# Patient Record
Sex: Female | Born: 1970 | Race: White | Hispanic: No | Marital: Married | Smoking: Never smoker
Health system: Southern US, Community
[De-identification: ages and names within clinical notes are randomized; demographics above are authoritative.]

## PROBLEM LIST (undated history)

## (undated) DIAGNOSIS — D649 Anemia, unspecified: Secondary | ICD-10-CM

## (undated) DIAGNOSIS — IMO0001 Reserved for inherently not codable concepts without codable children: Secondary | ICD-10-CM

## (undated) DIAGNOSIS — M1810 Unilateral primary osteoarthritis of first carpometacarpal joint, unspecified hand: Secondary | ICD-10-CM

## (undated) DIAGNOSIS — N289 Disorder of kidney and ureter, unspecified: Secondary | ICD-10-CM

## (undated) DIAGNOSIS — N979 Female infertility, unspecified: Secondary | ICD-10-CM

## (undated) DIAGNOSIS — R7303 Prediabetes: Secondary | ICD-10-CM

## (undated) DIAGNOSIS — R200 Anesthesia of skin: Secondary | ICD-10-CM

## (undated) DIAGNOSIS — N1330 Unspecified hydronephrosis: Secondary | ICD-10-CM

## (undated) DIAGNOSIS — M2550 Pain in unspecified joint: Secondary | ICD-10-CM

## (undated) DIAGNOSIS — K219 Gastro-esophageal reflux disease without esophagitis: Principal | ICD-10-CM

## (undated) DIAGNOSIS — L709 Acne, unspecified: Secondary | ICD-10-CM

## (undated) DIAGNOSIS — R600 Localized edema: Secondary | ICD-10-CM

## (undated) DIAGNOSIS — M25569 Pain in unspecified knee: Secondary | ICD-10-CM

## (undated) HISTORY — DX: Unspecified hydronephrosis: N13.30

## (undated) HISTORY — DX: Localized edema: R60.0

## (undated) HISTORY — DX: Female infertility, unspecified: N97.9

## (undated) HISTORY — DX: Unilateral primary osteoarthritis of first carpometacarpal joint, unspecified hand: M18.10

## (undated) HISTORY — PX: OTHER SURGICAL HISTORY: SHX169

## (undated) HISTORY — DX: Prediabetes: R73.03

## (undated) HISTORY — DX: Acne, unspecified: L70.9

## (undated) HISTORY — DX: Disorder of kidney and ureter, unspecified: N28.9

## (undated) HISTORY — DX: Pain in unspecified knee: M25.569

## (undated) HISTORY — DX: Anemia, unspecified: D64.9

## (undated) HISTORY — DX: Reserved for inherently not codable concepts without codable children: IMO0001

## (undated) HISTORY — DX: Pain in unspecified joint: M25.50

## (undated) HISTORY — DX: Anesthesia of skin: R20.0

## (undated) HISTORY — DX: Gastro-esophageal reflux disease without esophagitis: K21.9

---

## 1999-05-19 ENCOUNTER — Other Ambulatory Visit: Admission: RE | Admit: 1999-05-19 | Discharge: 1999-05-19 | Payer: Self-pay | Admitting: Obstetrics and Gynecology

## 1999-12-11 ENCOUNTER — Other Ambulatory Visit: Admission: RE | Admit: 1999-12-11 | Discharge: 1999-12-11 | Payer: Self-pay | Admitting: Obstetrics and Gynecology

## 2001-03-14 ENCOUNTER — Ambulatory Visit (HOSPITAL_COMMUNITY): Admission: RE | Admit: 2001-03-14 | Discharge: 2001-03-14 | Payer: Self-pay | Admitting: Obstetrics and Gynecology

## 2001-03-14 ENCOUNTER — Encounter: Payer: Self-pay | Admitting: Obstetrics and Gynecology

## 2001-05-02 ENCOUNTER — Encounter (INDEPENDENT_AMBULATORY_CARE_PROVIDER_SITE_OTHER): Payer: Self-pay

## 2001-05-02 ENCOUNTER — Other Ambulatory Visit: Admission: RE | Admit: 2001-05-02 | Discharge: 2001-05-02 | Payer: Self-pay | Admitting: Obstetrics and Gynecology

## 2001-07-17 ENCOUNTER — Other Ambulatory Visit: Admission: RE | Admit: 2001-07-17 | Discharge: 2001-07-17 | Payer: Self-pay | Admitting: Obstetrics and Gynecology

## 2002-02-03 ENCOUNTER — Encounter: Payer: Self-pay | Admitting: Internal Medicine

## 2002-02-03 ENCOUNTER — Encounter: Admission: RE | Admit: 2002-02-03 | Discharge: 2002-02-03 | Payer: Self-pay | Admitting: Internal Medicine

## 2002-02-17 ENCOUNTER — Ambulatory Visit (HOSPITAL_COMMUNITY): Admission: RE | Admit: 2002-02-17 | Discharge: 2002-02-17 | Payer: Self-pay | Admitting: Orthopedic Surgery

## 2002-02-17 ENCOUNTER — Encounter: Payer: Self-pay | Admitting: Orthopedic Surgery

## 2002-06-04 ENCOUNTER — Ambulatory Visit (HOSPITAL_COMMUNITY): Admission: RE | Admit: 2002-06-04 | Discharge: 2002-06-04 | Payer: Self-pay | Admitting: Internal Medicine

## 2002-06-04 ENCOUNTER — Encounter: Payer: Self-pay | Admitting: Internal Medicine

## 2002-08-21 ENCOUNTER — Other Ambulatory Visit: Admission: RE | Admit: 2002-08-21 | Discharge: 2002-08-21 | Payer: Self-pay | Admitting: Obstetrics and Gynecology

## 2003-07-30 ENCOUNTER — Other Ambulatory Visit: Admission: RE | Admit: 2003-07-30 | Discharge: 2003-07-30 | Payer: Self-pay | Admitting: Obstetrics and Gynecology

## 2004-03-21 ENCOUNTER — Ambulatory Visit (HOSPITAL_COMMUNITY): Admission: RE | Admit: 2004-03-21 | Discharge: 2004-03-21 | Payer: Self-pay | Admitting: Obstetrics and Gynecology

## 2004-03-24 ENCOUNTER — Ambulatory Visit (HOSPITAL_COMMUNITY): Admission: RE | Admit: 2004-03-24 | Discharge: 2004-03-24 | Payer: Self-pay | Admitting: Urology

## 2004-04-26 ENCOUNTER — Inpatient Hospital Stay (HOSPITAL_COMMUNITY): Admission: AD | Admit: 2004-04-26 | Discharge: 2004-04-26 | Payer: Self-pay | Admitting: Obstetrics and Gynecology

## 2004-05-29 ENCOUNTER — Inpatient Hospital Stay (HOSPITAL_COMMUNITY): Admission: AD | Admit: 2004-05-29 | Discharge: 2004-06-01 | Payer: Self-pay | Admitting: Obstetrics and Gynecology

## 2004-06-02 ENCOUNTER — Inpatient Hospital Stay (HOSPITAL_COMMUNITY): Admission: AD | Admit: 2004-06-02 | Discharge: 2004-06-02 | Payer: Self-pay | Admitting: Obstetrics and Gynecology

## 2004-06-04 ENCOUNTER — Inpatient Hospital Stay (HOSPITAL_COMMUNITY): Admission: AD | Admit: 2004-06-04 | Discharge: 2004-06-08 | Payer: Self-pay | Admitting: Obstetrics and Gynecology

## 2004-06-05 ENCOUNTER — Encounter (INDEPENDENT_AMBULATORY_CARE_PROVIDER_SITE_OTHER): Payer: Self-pay | Admitting: Specialist

## 2004-06-23 ENCOUNTER — Ambulatory Visit (HOSPITAL_BASED_OUTPATIENT_CLINIC_OR_DEPARTMENT_OTHER): Admission: RE | Admit: 2004-06-23 | Discharge: 2004-06-23 | Payer: Self-pay | Admitting: Urology

## 2004-06-23 ENCOUNTER — Ambulatory Visit (HOSPITAL_COMMUNITY): Admission: RE | Admit: 2004-06-23 | Discharge: 2004-06-23 | Payer: Self-pay | Admitting: Urology

## 2004-07-20 ENCOUNTER — Other Ambulatory Visit: Admission: RE | Admit: 2004-07-20 | Discharge: 2004-07-20 | Payer: Self-pay | Admitting: Obstetrics and Gynecology

## 2005-08-07 ENCOUNTER — Other Ambulatory Visit: Admission: RE | Admit: 2005-08-07 | Discharge: 2005-08-07 | Payer: Self-pay | Admitting: Obstetrics and Gynecology

## 2005-09-05 ENCOUNTER — Encounter: Admission: RE | Admit: 2005-09-05 | Discharge: 2005-09-05 | Payer: Self-pay | Admitting: Obstetrics and Gynecology

## 2006-08-09 ENCOUNTER — Other Ambulatory Visit: Admission: RE | Admit: 2006-08-09 | Discharge: 2006-08-09 | Payer: Self-pay | Admitting: Obstetrics and Gynecology

## 2010-12-12 ENCOUNTER — Encounter
Admission: RE | Admit: 2010-12-12 | Discharge: 2010-12-12 | Payer: Self-pay | Source: Home / Self Care | Attending: Obstetrics and Gynecology | Admitting: Obstetrics and Gynecology

## 2011-04-20 NOTE — Discharge Summary (Signed)
NAME:  Kendra Day, Kendra Day                     ACCOUNT NO.:  [id number]   MEDICAL RECORD NO.:  [id number]                   PATIENT TYPE:  INP   LOCATION:  9310                                 FACILITY:  [facility]   PHYSICIAN:  Huel Cote, M.D.              DATE OF BIRTH:  [birth year]   DATE OF ADMISSION:  06/04/2004  DATE OF DISCHARGE:  06/08/2004                                 DISCHARGE SUMMARY   DISCHARGE DIAGNOSES:  1. Preterm pregnancy at 34+ weeks delivered.  2. Acute renal failure with history of obstructive nephropathy.  3. Possible superimposed preeclampsia.  4. Desired permanent sterility.  5. Status post low transverse cesarean section with bilateral tubal     ligation.   DISCHARGE MEDICATIONS:  1. Percocet 1-2 tablets p.o. q.4h. p.r.n. pain.  2. Labetalol 200 mg p.o. b.i.d.  3. Cipro 500 mg p.o. daily.   FOLLOWUP:  The patient is to follow up with Dr. Ihor Gully of Urology in  approximately 3-4 days for follow up of her creatinine and hydronephrosis  status.  She will follow up with our office in approximately 2 weeks for  incision check and blood pressure check.   HOSPITAL COURSE:  The patient is a 40 year old G1, P0 who is admitted at 34+  weeks with a complaint of increased leg and back pain as well as edema.  She  had no headache, nausea and vomiting was baseline for her over the last  several weeks, and no abdominal pain.  The patient had a very complicated  prenatal course with history of obstructive nephropathy which was first  noted at [redacted] weeks gestation and initially responded to bilateral stent  placement by urology.  After approximately 3-4 weeks, the patient began to  bump her creatinine and have worsening hydronephrosis and it was felt this  was due to reflux into the stent, so she was decompressed with the Foley  catheter which did improve her creatinine and hydronephrosis temporarily.  The patient refused to continue decompression with Foley  catheter  indefinitely, and therefore was followed with serial ultrasounds and  creatinines which remained stable at approximately 1.6-1.8 for most of the  remaining pregnancy.  The week prior to admission, the patient notably had a  jump in her creatinine from 2-2.4, was admitted, completed a 24 hour urine  and other observation lab studies which did not reveal any other evidence of  preeclampsia. Creatinine stabilized with a Foley catheter replaced, and the  patient agreed to continue this, was discharged home.  Creatinine upon  discharge was 2.2.  She then was home for approximately four days when she  noted increased swelling and continued nausea and vomiting. On return to the  hospital, the patient's creatinine was found to have risen to 3.4.  She also  had placenta previa noted on early ultrasound which was felt to be resolved  at the 32 week ultrasound and there was fetal renal pyelectasis noted  on  ultrasound which remained stable.  Last ultrasound on June 16.   PAST GYNECOLOGICAL HISTORY:  Significant for HPV and infertility.   PAST MEDICAL HISTORY:  Frequent urinary tract infections  __________.   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   ALLERGIES:  PENICILLIN, SULFA, KEFLEX.   ADMISSION MEDICATIONS:  1. Phenergan.  2. Zofran.  3. Zantac.  4. Ditropan.   PHYSICAL EXAMINATION:  VITAL SIGNS:  Afebrile, stable vital signs.  Blood  pressure 150-170/90-100.  Fetal heart rate was reactive with some  irritability noted.  HEART:  Regular rate and rhythm.  LUNGS:  Clear.  ABDOMEN:  Soft, gravid, nontender.  She had 3+ pitting edema.  DTR's were  3/4 with 1-2 beats of clonus.  Her cervix was closed, 30 and high, and she  was vertex by ultrasound.   HOSPITAL COURSE:  The patient was admitted for worsening edema and blood  pressures which were persistently elevated with the intention of probable  delivery should her  condition warrant it.  The patient was begun on  magnesium prophylaxis for possible superimposed preeclampsia.  However,  received no bolus and simply was begun on 1 g an hour.  Repeat lab several  hours after admission revealed her creatinine rising even more to 3.6.  At  this point, urology and nephrology were consulted and all were in agreement  that the patient should be delivered given the unknown etiology of her  worsening renal disease and possible superimposed preeclampsia.  It was felt  that any morbidity of any further procedures for obstruction was not worth  the possible benefit to the baby which had already received steroids and was  greater than 34 weeks in gestation.  As the patient was remote from delivery  and would have required a lengthy induction, decision was made to proceed  with primary cesarean section and spare any further worsening of her renal  function.  She underwent a low transverse cesarean section and a bilateral  tubal ligation per her request on June 05, 2004, and was delivered of a  viable female infant which was in transverse  presentation.  Apgars were 4 and  8.  Weight was 5 pounds 9 ounces. Cor pH was 7.26.  Placenta was low and  anterior and did require going through with the incision.  The ovaries were  normal bilaterally.  She had a right fallopian tube which had a large  varicosity encasing the entire tube.  The left tube was normal.  Estimated  blood loss for the cesarean section was 1000 cc.  She did well  postoperatively and was continued on her magnesium for 24 hours.  This was  discontinued the following day.  Urine output began to increase  substantially with diuresis noted postoperative day #1.  Her blood pressures  remained elevated at 160-170/90.  Creatinine began to slowly improve and  decrease to 3.1.  She was followed closely over the next several days, and  on postop day #3, the patient's creatinine was down to 2.3.  Hemoglobin was stable  at 8.2.  Blood pressure had become better at 130-70/80 on Labetalol  100 mg p.o. twice daily.  She was tolerating regular diet.  Had no further  nausea and vomiting and felt overall well.  She was therefore felt stable  for discharge home.  Her Foley was discontinued the day of discharge per  request after discussions with Dr. Ihor Gully, and it  was agreed that she  will be followed up within several days with repeat creatinine and  ultrasound to ensure further improvement.                                               Huel Cote, M.D.    KR/MEDQ  D:  06/08/2004  T:  06/08/2004  Job:  [id number]

## 2011-04-20 NOTE — Discharge Summary (Signed)
NAME:  Kendra Day, Kendra Day                     ACCOUNT NO.:  [id number]   MEDICAL RECORD NO.:  [id number]                   PATIENT TYPE:  INP   LOCATION:  9159                                 FACILITY:  [facility]   PHYSICIAN:  Huel Cote, M.D.              DATE OF BIRTH:  [birth year]   DATE OF ADMISSION:  05/29/2004  DATE OF DISCHARGE:  06/01/2004                                 DISCHARGE SUMMARY   DISCHARGE DIAGNOSES:  1. Preterm pregnancy at 33+ weeks, undelivered.  2. Obstructive uropathy, rule out preeclampsia.  3. Status post betamethasone x2.   DISCHARGE FOLLOW-UP:  The patient will be returning for nonstress tests and  labs in approximately 2 days at the maternity admissions unit.   DISCHARGE MEDICATIONS:  1. Zofran 8 mg p.o. b.i.d. p.r.n.  2. Phenergan 25 mg p.o. q.4-6h. p.r.n.   HOSPITAL COURSE:  The patient is a 40 year old G1 P0 who was admitted at 4  and four-sevenths weeks gestation for evaluation given an ongoing problem  with renal disease, hypertension, and worsening creatinine function on  laboratory.  The patient had been followed by Dr. Vernie Ammons of urology since  approximately [redacted] weeks gestation for obstructive uropathy which was believed  to be secondary to history of ureteral implantation surgery as a child and  the gravid uterus kinking the system.  The patient had stents placed at 24  weeks with improvement initially in her creatinine and hydronephrosis.  However, at 27-28 weeks the recurrent hydronephrosis was present with  increased creatinine and was felt to be secondary to reflux in the stents.  When the patient had a Foley catheter replaced to continuous drainage her  creatinine did improve.  However, she was too uncomfortable to maintain this  catheter in place.  Therefore, she was followed with serial ultrasounds and  creatinines which were stable at approximately 1.6.  Over the last week the  patient developed nausea, vomiting, and increased back  pain with creatinine  increasing to 2.2 and proteinuria present.  A 24-hour urine revealed 400 mg  of proteinuria and blood pressure was elevated at 150 to 160 over 100 to 105  which was somewhat consistent with her pressures throughout the entire  pregnancy.  Prenatal care otherwise had been complicated only by placenta  previa which resolved, and fetal pyelectasis which was stable by last  ultrasound on May 17, 2004.  Prenatal labs were as follows:  A positive,  antibody negative, rubella immune, RPR nonreactive, HIV declined, GC  negative, chlamydia negative.  Past OB history:  The patient has one adopted  child from [place] but no prior pregnancies.  Past GYN history:  HPV.  Past  surgical history:  In 1973 she had the ureteral reimplantation and stent  placement in 2005.  Her allergies include PENICILLIN and SULFA.  Medications  are prophylactic antibiotics with Macrobid, Zofran, Zantac, and K-Dur.  On  admission her blood pressure was consistent with 140s over  80s and NST was  reactive.  Cardiac exam was regular rate and rhythm.  Lungs were clear.  She  had edema present, approximately 1+.  PIH labs were performed and within  normal limits on the liver studies and platelets.  Her uric acid was  elevated at 8.6 and her creatinine was 2.2.  A 24-hour urine had a good  volume at 2700 mL and total protein of 410 mg.  The patient was admitted and  placed a Foley catheter to see if her creatinines would once again improve  with decompression.  This was left in place for approximately 2 days and the  patient received betamethasone x2 doses, given the possible need to deliver.  The patient was continued in-house for 4 days into [redacted] weeks gestation.  At  that point her blood pressures were stable at 150 to 160 over 90 to 100.  PIH labs remained stable and her creatinine improved to 2.2 with Foley  decompression.  Uric acid decreased to 6.9.  There was no other evidence of  preeclampsia and  urine protein was  negative on catheterized specimen.  It was therefore felt that the patient's  symptoms had been related primarily to her obstructive nephropathy and her  Foley catheter was left in place for continued observation.  She will return  in 2 days time for a repeat NST and creatinine evaluation to ensure no  worsening of this function.                                               Huel Cote, M.D.    KR/MEDQ  D:  07/20/2004  T:  07/21/2004  Job:  [id number]

## 2011-04-20 NOTE — Consult Note (Signed)
NAME:  Kendra Day, Kendra Day                     ACCOUNT NO.:  [id number]   MEDICAL RECORD NO.:  [id number]                   PATIENT TYPE:  INP   LOCATION:  9372                                 FACILITY:  [facility]   PHYSICIAN:  Cynthia B. Eliott Nine, M.D.             DATE OF BIRTH:  21-Feb-1971   DATE OF CONSULTATION:  DATE OF DISCHARGE:                                   CONSULTATION   REASON FOR CONSULTATION:  Worsening renal failure.   HISTORY:  This is a 40 year old white female who is now about 34-1/[redacted] weeks  pregnant who is admitted with back and flank discomfort, edema, worsening  hypertension and worsening renal insufficiency.  Her history is pertinent  for childhood reflux requiring bilateral ureteral reimplantation in the  1970s.  She has had problems over the years with recurrent urinary tract  infections.  Her serum creatinine in 2004 was at a baseline of 1.1.   She was evaluated by Dr. Ihor Gully because of a serum creatinine of 1.8  to 1.9.  This was back in April of 2005.  She had an ultrasound which  confirmed bilateral hydronephrosis as well as a perinephric fluid collection  on the right.  She had a subsequent ultrasound that showed worsening  hydronephrosis with a new perinephric fluid collection on the contralateral  side.  She was taken to the operating room on April 22nd and underwent  placement of bilateral ureteral stents.  Both ureteral orifices at that time  were identified and appeared to be widely patent.  The double J stents were  placed and with good position.   Foley catheter drainage of the bladder was also recommended, but she could  not tolerate chronic indwelling placement of the Foley because of  discomfort.   She was just readmitted last week with a further elevation in her creatinine  which improved some with Foley drainage.  She was just readmitted with  worsening flank discomfort which she describes as being just like my kidney  when it hurts and an  elevated blood pressure which has ranged in the 160/90  to 100 range (but was also elevated during her pregnancy) and edema.  She is  to undergo a cesarean section later this afternoon in hopes of resolving her  obstruction and improving her hypertension.  There is some thought that she  may have some preeclampsia, although that is not a strong suspicion in the  minds of the obstetrics group who feel that this is more likely a more  chronic hypertension.   PAST MEDICAL HISTORY:  1. Significant for childhood reflux requiring bilateral ureteral stent     implantation.  2. Recurrent cystitis/urinary tract infections.   ALLERGIES:  ALLERGIES TO PENICILLIN AND SULFA.   FAMILY HISTORY:  Positive for hypertension, diabetes, kidney stones, and  prostate cancer.  There is no other family member with reflux nephropathy.   SOCIAL HISTORY:  The patient has not used any  alcohol during her pregnancy.  She does not use tobacco.  She is married.  She and her husband have an  adopted 65-year-old son from [place].   REVIEW OF SYSTEMS:  Positive for nausea, back pain, breast soreness and  swelling.   PHYSICAL EXAMINATION:  GENERAL:  She is a very pleasant young woman who is  in no distress.  She is hooked up to various monitors, a Foley catheter is  in place which is draining somewhat dark concentrated looking urine.  VITAL SIGNS: Blood pressure 168/98, heart rate 88 and regular.  NECK:  She had no discernable JVD.  LUNGS:  Entirely clear.  CARDIAC:  S1, S2, no audible S3, S4 or murmur.  ABDOMEN:  Protuberant and gravid.  There was no focal abdominal tenderness.  EXTREMITIES:  She had 1 to 2+ edema of the lower extremities.   LABORATORY DATA:  Sodium 137, potassium 3.1, chloride 105, CO2 22, BUN 14,  creatinine 3.6, calcium 7.9, magnesium 3.6, hemoglobin 9.1, hematocrit 26.9,  WBC 11,400.  Urinalysis showed 30 mg percent protein with 7 10 to white  cells.  Uric acid was 8.6, total LDH 154, alkaline  phosphatase elevated at  236, SGOT and SGPT and bilirubin were all within normal limits.   IMPRESSION:  A 40 year old white female with a history of reflux and prior  history of bilateral ureteral reimplantation with bilateral double J stents  in place for hydronephrosis who presents with one doubling of her serum  creatinine from her baseline just over the past several days.  I would have  to presume that this is largely related to obstruction.  With the  anticipated cesarean section later on today, hopefully this problem with  resolve itself and not require reevaluation by urology and/or replacement of  stents.  However, if with the delivery her renal function does not improve,  this will need to be pursued.   She is hypertensive and has had blood pressures according to her  obstetrician outpatient clinic records as high as 170/100.  I agree with the  use of intravenous labetalol which has already been implemented.  She may  likely have chronic hypertension and this will need to be addressed  postpartum.  I, too, doubt seriously that she has an element of  preeclampsia, but again the delivery should take care of this issue as well.  It is important to be careful with the use of magnesium in this patient  until we see improvement in her renal function.  Standard doses could result  in an increase in levels fairly quickly.   RECOMMENDATIONS:  1. Agree with cesarean section.  2. Would monitor renal function studies every six hours for the first 24     hours to look for trends, hopefully downward, in her serum creatinine.  3. Would not use an perioperative nonsteroidal anti-inflammatory drugs for     pain.  4. She has been receiving some Macrodantin as an outpatient.  This is not     highly effective and potentially toxic with a creatinine in the 3s, I     would opt for something like Cipro since she has allergies to penicillin     and sulfa. 5. She will require outpatient followup for  her reflux and chronic kidney     disease.  Even if her creatinine returns to a baseline of 1.1 this is     clearly not normal renal function for a 40 year old female and she would  require nephrology followup if possible.  6. Urology should become reinvolved particularly if her creatinine does not     improve postdelivery.   We appreciate the opportunity to participate in the care of this very nice  young lady.  We will follow closely with you.                                               Duke Salvia Eliott Nine, M.D.    CBD/MEDQ  D:  06/05/2004  T:  06/05/2004  Job:  [id number]

## 2011-04-20 NOTE — H&P (Signed)
NAME:  Kendra Day, Kendra Day                     ACCOUNT NO.:  [id number]   MEDICAL RECORD NO.:  [id number]                   PATIENT TYPE:  AMB   LOCATION:  DAY                                  FACILITY:  [facility]   PHYSICIAN:  Mark C. Vernie Ammons, M.D.               DATE OF BIRTH:  1971-10-25   DATE OF ADMISSION:  03/23/2004  DATE OF DISCHARGE:                                HISTORY & PHYSICAL   The patient is a 40 year old white female who was seen in office  consultation today for further evaluation of bilateral hydronephrosis.  I  had spoken with Dr. Senaida Ores on the phone about the patient, who is [redacted]  weeks pregnant, and was found to have an elevation of her creatinine, and a  low creatinine clearance.  That prompted a renal ultrasound, which revealed  bilateral hydronephrosis.  The patient states that she has had pain in her  right flank region which comes and goes.  It has been there about a month.  It is achy in type.  It radiates slightly into the abdomen.  She noted also  that when she had her ultrasound done that when the probe was being placed  in the area of the kidney, it was tender.  She has not seen any hematuria.  She does have increased frequency and urgency, as to be expected with her  pregnancy.  She otherwise has not had any flank pain, nausea, or vomiting,  fever, or chills.  She otherwise has not had any difficulty, other than some  recent hypertension.   PAST MEDICAL HISTORY:  Negative for any major medical problems.   SURGICAL HISTORY:  She had bilateral ureteral re-implant by her history for  reflux at the age of 76.   MEDICATIONS:  1. Zantac.  2. Tums.   ALLERGIES:  1. PENICILLIN.  2. SULFA.  (But she can take Keflex, and has done so in the past).   SOCIAL HISTORY:  No tobacco or ethanol.   FAMILY HISTORY:  Father is 2.  Mother is 9.  There is hypertension,  prostate cancer, diabetes, and kidney stones in the family.   REVIEW OF SYSTEMS:  As noted per  patient check list in the chart, and  includes frequency, swelling in her lower extremities.   LABORATORY RESULTS:  On March 16, 2004, her creatinine was 1.9.  A repeat on  March 20, 2004 was 1.8.  Old creatinines were obtained, and in June of 2003  it was 0.9, and in February of 2004, it was 1.1.   PHYSICAL EXAMINATION:  GENERAL:  The patient is a well-developed, well-  nourished, white female in no apparent distress.  VITAL SIGNS:  Blood pressure is 165/101, pulse 97, temperature 98.6.  HEENT:  Atraumatic and normocephalic.  Oropharynx is clear.  NECK:  Supple with midline trachea.  LUNGS:  She has normal respiratory effort.  CARDIOVASCULAR:  Regular rate and rhythm.  ABDOMEN:  Reveals gravid uterus.  Nontender, no mass.  She had mild right  CVA tenderness to percussion.  PELVIC:  Deferred until her surgery tomorrow.  EXTREMITIES:  Without clubbing, cyanosis, or mild edema in the lower  extremities.  No deformity.  SKIN:  Warm and dry.  NEUROLOGIC:  She is alert and oriented with appropriate mood and affect, and  no gross focal neurologic deficits.   I reviewed her renal ultrasound done on March 21, 2004 which revealed  moderate to severe bilateral hydronephrosis.  There is some clubbing of the  caliceal systems.  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   Her urinalysis today is clear.   I repeated a renal ultrasound using a 5 MHz probe, I noted her right kidney  measured 14 cm with 14 mm of parenchyma.  The left kidney measured 12.6 cm  with 8 mm of parenchyma.  Both kidneys had bilateral hydronephrosis.  It was  severe on both sides, and appears to have progressed.  The perinephric fluid  collection is again noted.  It is echogenic and unchanged in size.  There  also appeared to be a small amount of perinephric fluid near  the upper pole  on the contralateral side, as well.   IMPRESSION:  Bilateral hydronephrosis with interval worsening by ultrasound,  and an elevated creatinine from baseline.  I discussed with the patient and  her husband the fact that I thought by ultrasound some of the changes were  chronic, but it appears that since there has been progression, that there is  undoubtedly bilateral obstruction causing this, and likely resulting in  forniceal rupture.  Because of that, I feel that bilateral stents are  indicated.  We discussed the fact that with the stents in place, if her  hydronephrosis resolves and her creatinine improves, I would leave those  until after her pregnancy, and they study her further.  My thought about the  obstruction is that because she had bilateral reimplantation, it may have  changed the anatomy of the ureters, which may have allowed the gravid uterus  to compress the ureters and result in this hydronephrosis.  It is also  possible there could be scarring, although it has been so long I think that  is very unlikely.  I discussed with the patient and her family the fact that  stents would be placed.  I would protect the fetus with lead, and use just a  single pulse of fluoro to confirm that the stent is in the correct place  intraoperatively.  We discussed the risks and complications including, but  not limited to, bleeding, infection, risk of anesthetic, and risk to the  fetus, as well as possible induced labor.  The stents will remain in place  for at least 4-5 months.  I will follow renal ultrasounds, and then work her  up for the cause of her hydronephrosis once she is delivered.  She does not  plan to breast feed.   PLAN:  1. I have contacted Dr. Berenda Morale office, and spoke to Dr. Jackelyn Knife.  He     indicated that for this procedure, preoperative and postoperative fetal    heart tones should be evaluated, but intraoperative monitoring was not     necessary.   2. She has been scheduled to see a neonatologist  prior to this for     consultation in preparation for her surgery.  3. I am going to plan to place bilateral ureteral stents tomorrow.  We     discussed the fact that it might be somewhat more difficult due to the     fact that she has had re-implants.                                               Mark C. Vernie Ammons, M.D.    MCO/MEDQ  D:  03/23/2004  T:  03/23/2004  Job:  [id number]   cc:   Dr. Senaida Ores

## 2011-04-20 NOTE — Op Note (Signed)
NAME:  Kendra Day, Kendra Day                     ACCOUNT NO.:  [id number]   MEDICAL RECORD NO.:  [id number]                   PATIENT TYPE:  AMB   LOCATION:  NESC                                 FACILITY:  [facility]   PHYSICIAN:  Mark C. Vernie Ammons, M.D.               DATE OF BIRTH:  1971-06-09   DATE OF PROCEDURE:  06/24/2004  DATE OF DISCHARGE:                                 OPERATIVE REPORT   PREOPERATIVE DIAGNOSES:  Bilateral hydronephrosis.   POSTOPERATIVE DIAGNOSES:  Bilateral hydronephrosis.   PROCEDURE:  Cystoscopy, bilateral retrograde pyelograms with interpretation,  bilateral ureteroscopy and bilateral double J stent replacement.   SURGEON:  Mark C. Vernie Ammons, M.D.   ANESTHESIA:  General and LMA.   ESTIMATED BLOOD LOSS:  Less than 1 mL.   SPECIMENS:  None.   DRAINS:  4.5 French 24 cm double J stents in both right and left ureters.   COMPLICATIONS:  None.   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  She had normal  creatinine until she became pregnant and she was noted to have  hydronephrosis and elevation of her creatinine. Stents were placed and she  was managed throughout her pregnancy with the double J stents in place. 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 She has now had a cesarean section and is  brought to the OR for evaluation of the cause of her obstruction. The risks,  complications, alternatives and limitations were discussed with the patient.  She understands and wishes to proceed.   DESCRIPTION OF PROCEDURE:  After informed consent was obtained, the patient  was taken to the major OR, placed on the table, administered general  anesthesia and then moved to the dorsal lithotomy position.  Her genitalia  was sterilely prepped and draped. A 21 French cystoscope was then introduced  in the  bladder. Both stents were identified exiting the ureteral orifices,  grasped with alligator graspers and extracted. I then used a 6 [place] cone  tip ureteral catheter, placed this initially in the left orifice which is  noted to be relatively patulous so I had to advance it up the ureter  somewhat. I then performed a retrograde pyelogram which revealed a dilated  ureter above the level of the bladder to the level of the kidney and some  mild blunting of the calices.  No UPJ obstruction was identified. I then  withdrew the ureteral catheter and observed the contrast under fluoroscopy  as it progressed down the ureter and stopped several centimeters above the  bladder.  I did not see any flow through this region.   Attention was then directed to the right side, an identical procedure was  performed. On this side, the renal pelvis appeared to be more dilated and  there appeared to  be a relative secondary UPJ obstruction on that side.  It  is impossible to say at this point whether there was a UPJ obstruction there  prior to the dilatation or whether the dilatation caused redundancy in the  ureter and some kinking and this may eventually resolve. Either way, the  ureter was filled with contrast and again it was observed to pass down to  the level of the bladder and a very thin wisp of contrast appeared to  possibly get through this area into the bladder; however, it remained  essentially obstructed.   I therefore elected to rule out scar, stricture and lesion  ureteroscopically. A 6 French rigid ureteroscope was then passed up the left  ureter. I noted some edema from the stent but I was able to easily pass this  on up the ureter into the dilated portion and then back on down. With  observation and flow through the ureteroscope, the entire ureter appeared  patent indicating likely extrinsic compression.  Ureteroscopy was then  performed on the right side with identical findings again being  noted in the  distal ureter.   A guidewire was then passed up each ureter and the double J stents were then  passed over the guidewire with the aid of a cystoscope into the area of the  renal pelvis. The guidewire was removed with good curl being noted in both  renal pelves and in the bladder on both sides. I then drained the bladder,  the patient was given a B&O suppository and she was awakened and taken to  the recovery room in stable satisfactory condition.  She tolerated the  procedure well with no intraoperative complications.   She will be given a prescription for Vicodin ES 1-2 q.4 h. p.r.n. #36 and  Pyridium plus 1 q.6 h. p.r.n. dysuria #36 and followup in my office in four  weeks for creatinine, renal ultrasound and to allow her uterus to shrink  further with hopes of eventually rendering her stent free.                                               Mark C. Vernie Ammons, M.D.    MCO/MEDQ  D:  06/23/2004  T:  06/24/2004  Job:  [id number]   cc:   Huel Cote, M.D.  [address] [place], [address]  [place], [place] [address]  Fax: [phone]

## 2011-04-20 NOTE — Op Note (Signed)
NAME:  Kendra Day, COLSON                     ACCOUNT NO.:  [id number]   MEDICAL RECORD NO.:  [id number]                   PATIENT TYPE:  AMB   LOCATION:  DAY                                  FACILITY:  [facility]   PHYSICIAN:  Mark C. Vernie Ammons, M.D.               DATE OF BIRTH:  12-29-1970   DATE OF PROCEDURE:  03/24/2004  DATE OF DISCHARGE:                                 OPERATIVE REPORT   PREOPERATIVE DIAGNOSIS:  Bilateral hydronephrosis.   POSTOPERATIVE DIAGNOSIS:  Bilateral hydronephrosis.   OPERATION/PROCEDURE:  Cystoscopy and bilateral stent placements.   SURGEON:  Mark C. Vernie Ammons, M.D.   ANESTHESIA:  General endotracheal anesthesia.   DRAINS:  A 6-French 24 cm double-J stent in both ureters, no string.   ESTIMATED BLOOD LOSS:  None.   FLUOROSCOPIC TIME:  Three seconds.   SPECIMENS:  None.   COMPLICATIONS:  None.   INDICATIONS:  The patient is a 40 year old white female who is approximately  [redacted] weeks pregnant.  She had bilateral renal implants as a child and has done  well until recently.  She was, however, found to have elevated creatinine  1.8 to 1.9.  That was up from 1.1 a year previously.  Ultrasound confirmed  hydronephrosis bilaterally and a perinephric fluid collection on the right-  hand side.  Repeat ultrasound a few days later revealed worsening  hydronephrosis bilaterally and the development of a new perinephric fluid  collection on the contralateral side.  She is, therefore, brought to the OR  today for double-J stent placement bilaterally.  I discussed the risks,  complications, alternatives, and limitations with her.   DESCRIPTION OF PROCEDURE:  While in the holding area, the patient underwent  ultrasound of her fetus with a normal-appearing fetus identified with a  normal heart beat.  She was then taken back to the major OR, placed on the  table in the supine position and administered general endotracheal  anesthesia without difficulty.  She was then  placed in the dorsal lithotomy  position and her genitalia was sterilely prepped and draped after receiving  1 g of Kefzol IV.   A 22-French cystoscope sheath was introduced into the bladder.  With the 12-  degree lens the bladder was inspected and noted to be free of any tumors,  stones, or inflammatory lesions.  Both ureteral orifices were identified and  appeared widely patent.  A 0.038 inch floppy-tip guidewire was then passed  up first the left ureter and then the scope was repositioned and a second  guidewire was passed up the right hand side.  With the guidewire placed in  the right, I passed the double-J stent over the guidewire, left the stent  and pusher in place and backloaded the cystoscope on the guidewire that was  going up the left ureter and the double-J stent was placed there.  I then  withdrew both guidewires partially to allow curl to form in the  proximal end  of the stent bilaterally and to perform fluoroscopy that revealed good curl  in the anticipated area of the renal pelvis.  I, therefore, removed the  guidewire on both sides with good curl being noted in the bladder and second  __________ fluoroscopic image revealed the stents in good position  proximally.  The bladder was then drained and the patient was awakened and  taken to the recovery room in stable and satisfactory condition.  She  tolerated the procedure well with no intraoperative complications.   My plan will be to leave the stents indwelling throughout the pregnancy and  then evaluate her further once she has delivered.  She does not plan to  breast feed so there will be no risk of the anesthetic agent to be passed to  her newborn.  I will see her back in my office in one week for an ultrasound  and creatinine at that time.                                               Mark C. Vernie Ammons, M.D.    MCO/MEDQ  D:  03/24/2004  T:  03/24/2004  Job:  [id number]   cc:   Huel Cote, M.D.  [address] [place],  [address]  [place], [place] [address]  Fax: [phone]

## 2011-04-20 NOTE — Op Note (Signed)
NAME:  Kendra Day, Kendra Day                     ACCOUNT NO.:  [id number]   MEDICAL RECORD NO.:  [id number]                   PATIENT TYPE:  INP   LOCATION:  9372                                 FACILITY:  [facility]   PHYSICIAN:  Huel Cote, M.D.              DATE OF BIRTH:  Aug 01, 1971   DATE OF PROCEDURE:  06/05/2004  DATE OF DISCHARGE:                                 OPERATIVE REPORT   PREOPERATIVE DIAGNOSES:  1. Gestation at 34+ weeks, delivered.  2. Obstructive nephropathy, status post stent.  3. Worsening renal disease with possible superimposed preeclampsia.  4. Patient remote from delivery with medical need to expedite delivery.  5. Desires sterility.   POSTOPERATIVE DIAGNOSES:  1. Gestation at 34+ weeks, delivered.  2. Obstructive nephropathy, status post stent.  3. Worsening renal disease with possible superimposed preeclampsia.  4. Patient remote from delivery with medical need to expedite delivery.  5. Desires sterility.   PROCEDURES:  Primary low transverse cesarean section with bilateral tubal  ligation.   SURGEON:  Huel Cote, M.D.   ANESTHESIA:  Spinal.   FINDINGS:  There was a viable female infant in the transverse presentation.  Apgars were 4 and 8.  Weight was 5 pounds 9 ounces.  Placenta was low and  anterior.  Bilateral ovaries were normal.  The right tube had a very large  varicosity which encased the entire tube in a spiral fashion.  The left tube  was normal.   FLUIDS:  Estimated blood loss 1000 mL.  Urine output 150 mL clear urine.  IV  fluids 1400 mL.   The placenta was sent to pathology.   BRIEF PREOPERATIVE COURSE:  The patient is a 40 year old primigravida female  who has been followed closely throughout her pregnancy for problems with  obstructive nephropathy believed to be secondary to a ureteral  reimplantation she had as a child in combination with her gravid uterus  status.  The patient was admitted intermittently throughout pregnancy  and  required stent placement in her ureters bilaterally at 24 weeks.  She has  remained stable with hypertensive blood pressures and creatinines at the 1.6-  1.8 level, however over the past week developed worsening creatinines and  worsening blood pressures.  There is no other evidence for preeclampsia with  minimal protein in the urine and other preeclamptic labs were normal;  however, given her exacerbation of renal disease with a creatinine of 3.6 on  the day of delivery, nephrology recommended to expedite delivery of fetus to  clarify whether the patient had purely obstructive nephropathy versus  nephropathy with superimposed preeclampsia of an atypical fashion versus  some other underlying renal disorder.  For this reason the patient was  counseled for cesarean section as her cervix was unfavorable and the vertex  in a very high presentation, which indeed at C-section was found to be  transverse.  The patient also adamantly stated that she wished to have her  tubes tied for permanent sterilization given the complications on her renal  function of this pregnancy.  This is a reasonable request and was proceeded  with.   PROCEDURE NOTE:  The patient was taken to the operating room, where spinal  anesthesia was obtained without difficulty.  She was then prepped and draped  in the normal sterile fashion in the dorsal supine position with a leftward  tilt.  A Pfannenstiel skin incision was then made above a pre-existing one  which was on the pubic bone and carried through to the underlying layer of  fascia by sharp dissection and Bovie cautery.  There was considerable skin  edema noted during the incision.  The fascia was then nicked in the midline  and the incision was extended laterally with Mayo scissors.  The inferior  aspect was grasped with Kocher clamps and dissected off the rectus muscles  by sharp dissection and Bovie cautery.  In a similar fashion the superior  aspect of the  fascia was dissected with Bovie cautery off the rectus  muscles.  The rectus muscles were separated in the midline and the  peritoneal cavity was entered sharply.  There was a mesenteric adhesion  noted to go along the anterior abdominal wall all along the lower suprapubic  area.  This was divided and lifted away partially in order to expose the  lower uterine segment, although some adhesions remained at the conclusion of  the procedure.  The lower uterine segment was exposed and the vesicouterine  peritoneum identified and the bladder flap created with Metzenbaum scissors.  The bladder blade was then inserted and the uterus was incised in a  transverse fashion with a scalpel.  Upon entering the cavity the placenta  was low and anterior and was encountered at the level of the incision with  some bleeding noted.  The fetus, unfortunately, was in a transverse position  with the head at the maternal right and the back down, and attempts to push  the vertex to the incision were unsuccessful initially.  At this point the  feet were palpated and one foot was successfully delivered through the  incision; however, it was impossible to deliver the other foot and the  vertex would not shift position; therefore, the foot was replaced within the  uterine cavity and the sacrum and back of the infant were elevated into the  fundus to allow the vertex to come down slightly more toward the incision.  With a good deal of effort, the vertex was visible at the incision and a  vacuum was placed on the vertex to deliver it from the incision.  With the  infant's head delivered, the remainder of the body was delivered and the  cord clamped and cut.  The infant was suctioned and handed to the  pediatricians immediately given a floppy tone.  A cord gas was obtained and  was 7.26.  Cord blood was obtained and the placenta was delivered manually, the uterus cleared of all clots and debris with a moist lap sponge.   The  uterine incision was then closed with 0 chromic in a running locked fashion.  Good hemostasis was noted.  Some small areas of bleeding along the bladder  flap were controlled with Bovie cautery.  Attention was then turned to the  fallopian tubes, where the left fallopian tube was identified, grasped with  a Babcock clamp, and elevated with its fimbriated end clearly visible.  Approximately a 2-3 cm segment of tube was  then tied off with two free ties  of 0 plain and the tube itself amputated in a 2-3 cm segment and handed off  to pathology.  The right fallopian tube likewise was identified and had the  unusual finding of a very large varicosity which spiraled around the entire  fallopian tube itself.  The area where the varicosity was the smallest was  found and the entire tube and varicosity grasped with a Babcock clamp.  This  was then elevated and a 2-3 cm of both the varicosity and the tube tied off  with two free ties of 0 plain and reinforced with two free ties of 0 Vicryl  with good hemostasis noted.  The remaining portion of this tube pedicle was  then amputated with the varicosity and handed off to pathology.  There was  no active bleeding noted at either tubal pedicle.  These were returned to  the abdomen, the incision once again inspected and found to be hemostatic.  The bladder appeared nontraumatized.  There were no stents palpable anywhere  in the surgical field, and the gutters were cleared of all clots and debris.  All instruments and sponges were then removed from the abdomen and the  rectus muscles were reapproximated with several interrupted sutures of 0  Vicryl.  The fascia was then closed with 0 Vicryl in a running fashion,  subcutaneous tissue reapproximated with 3-0 Vicryl in a subcutaneous stitch,  and the skin was closed with staples.  Sponge, lap, and needle counts were  correct x2 and the patient was taken to the recovery room in stable  condition.                                                Huel Cote, M.D.   KR/MEDQ  D:  06/05/2004  T:  06/06/2004  Job:  [id number]

## 2012-01-31 ENCOUNTER — Other Ambulatory Visit: Payer: Self-pay | Admitting: Obstetrics and Gynecology

## 2012-01-31 DIAGNOSIS — Z1231 Encounter for screening mammogram for malignant neoplasm of breast: Secondary | ICD-10-CM

## 2012-02-21 ENCOUNTER — Ambulatory Visit
Admission: RE | Admit: 2012-02-21 | Discharge: 2012-02-21 | Disposition: A | Discharge status: Home / Self Care | Payer: BC Managed Care – PPO | Source: Ambulatory Visit | Attending: Obstetrics and Gynecology | Admitting: Obstetrics and Gynecology

## 2012-02-21 DIAGNOSIS — Z1231 Encounter for screening mammogram for malignant neoplasm of breast: Secondary | ICD-10-CM

## 2013-04-01 ENCOUNTER — Other Ambulatory Visit: Payer: Self-pay

## 2013-04-01 DIAGNOSIS — Z1231 Encounter for screening mammogram for malignant neoplasm of breast: Secondary | ICD-10-CM

## 2013-04-22 ENCOUNTER — Ambulatory Visit
Admission: RE | Admit: 2013-04-22 | Discharge: 2013-04-22 | Disposition: A | Discharge status: Home / Self Care | Payer: BC Managed Care – PPO | Source: Ambulatory Visit

## 2013-04-22 ENCOUNTER — Ambulatory Visit: Payer: BC Managed Care – PPO

## 2013-04-22 DIAGNOSIS — Z1231 Encounter for screening mammogram for malignant neoplasm of breast: Secondary | ICD-10-CM

## 2013-05-04 ENCOUNTER — Other Ambulatory Visit: Payer: Self-pay | Admitting: Obstetrics and Gynecology

## 2013-05-04 DIAGNOSIS — R928 Other abnormal and inconclusive findings on diagnostic imaging of breast: Secondary | ICD-10-CM

## 2013-05-13 ENCOUNTER — Ambulatory Visit
Admission: RE | Admit: 2013-05-13 | Discharge: 2013-05-13 | Disposition: A | Discharge status: Home / Self Care | Payer: BC Managed Care – PPO | Source: Ambulatory Visit | Attending: Obstetrics and Gynecology | Admitting: Obstetrics and Gynecology

## 2013-05-13 DIAGNOSIS — R928 Other abnormal and inconclusive findings on diagnostic imaging of breast: Secondary | ICD-10-CM

## 2013-10-07 ENCOUNTER — Other Ambulatory Visit: Payer: Self-pay | Admitting: Obstetrics and Gynecology

## 2013-10-07 DIAGNOSIS — R921 Mammographic calcification found on diagnostic imaging of breast: Secondary | ICD-10-CM

## 2013-11-13 ENCOUNTER — Ambulatory Visit
Admission: RE | Admit: 2013-11-13 | Discharge: 2013-11-13 | Disposition: A | Discharge status: Home / Self Care | Payer: BC Managed Care – PPO | Source: Ambulatory Visit | Attending: Obstetrics and Gynecology | Admitting: Obstetrics and Gynecology

## 2013-11-13 DIAGNOSIS — R921 Mammographic calcification found on diagnostic imaging of breast: Secondary | ICD-10-CM

## 2014-01-18 ENCOUNTER — Encounter (INDEPENDENT_AMBULATORY_CARE_PROVIDER_SITE_OTHER): Payer: Self-pay

## 2014-01-18 ENCOUNTER — Encounter: Payer: Self-pay | Admitting: Neurology

## 2014-01-18 ENCOUNTER — Ambulatory Visit (INDEPENDENT_AMBULATORY_CARE_PROVIDER_SITE_OTHER): Payer: BC Managed Care – PPO | Admitting: Neurology

## 2014-01-18 VITALS — BP 141/82 | HR 77 | Ht 65.0 in | Wt 183.0 lb

## 2014-01-18 DIAGNOSIS — IMO0001 Reserved for inherently not codable concepts without codable children: Secondary | ICD-10-CM

## 2014-01-18 DIAGNOSIS — R200 Anesthesia of skin: Secondary | ICD-10-CM

## 2014-01-18 DIAGNOSIS — K219 Gastro-esophageal reflux disease without esophagitis: Secondary | ICD-10-CM

## 2014-01-18 DIAGNOSIS — R209 Unspecified disturbances of skin sensation: Secondary | ICD-10-CM

## 2014-01-18 DIAGNOSIS — Z0289 Encounter for other administrative examinations: Secondary | ICD-10-CM

## 2014-01-18 MED ORDER — QUETIAPINE FUMARATE 25 MG PO TABS: 25.0000 mg | ORAL_TABLET | Freq: Every day | ORAL | Status: DC

## 2014-01-18 NOTE — Procedures (Signed)
   NCS (NERVE CONDUCTION STUDY) WITH EMG (ELECTROMYOGRAPHY) REPORT   STUDY DATE: Feb 16th 2015 PATIENT NAME: Kendra AntiguaMargaret C Lea DOB: 1971-08-30 MRN: [id number]    TECHNOLOGIST: Gearldine ShownLorraine Jones ELECTROMYOGRAPHER: Levert FeinsteinYan, Liller Yohn M.D.  CLINICAL INFORMATION: 43 years old left-handed Caucasian female, presenting with 2 weeks history of bilateral hands paresthesia, involving whole hands, left worse than right, getting worse when she tending down her neck, there was also anterior abdominal area numbness, she denies bilateral lower extremity paresthesia, no gait difficulty  On examination: Bilateral upper and lower extremity motor strength was normal, deep tendon reflex was brisk and symmetric. plantar responses were flexor.  FINDINGS: NERVE CONDUCTION STUDY: Bilateral median, ulnar sensory and motor responses were normal   NEEDLE ELECTROMYOGRAPHY: Selected needle examination was performed at left upper extremity muscles, and the left cervical paraspinal muscles.   Needle examination of left extensor digitorum communis, pronator teres, biceps, triceps, deltoid was normal  There was no spontaneous activity at left cervical paraspinal muscles, left C5, C6 and 7  IMPRESSION:   This is a normal study. There is no electrodiagnostic evidence of bilateral upper extremity neuropathy, in specific, there is no evidence of bilateral carpal tunnel syndrome. There is no evidence of left cervical radiculopathy.    INTERPRETING PHYSICIAN:   Levert FeinsteinYan, Fredrich Cory M.D. Ph.D. [facility] [address], [address] [place], [place] [address] [phone]

## 2014-01-18 NOTE — Progress Notes (Signed)
PATIENT: Kendra Day DOB: [birth year]  HISTORICAL  Kendra Day is a 43 years old left handed Caucasian female, referred by her primary care physician Dr. Jillyn Hidden for evaluation of bilateral hands paresthesia  She had past medical history of renal reflux developed hydronephrosis, with mild elevated creatinine at baseline, works as a Health and safety inspector job, keyboard typing, and using telephone frequently.  Over past 2 weeks, since early February 2015, she noticed bilateral hands paresthesia, left worse than right, mainly involving the middle 3 fingers, she denies significant weakness, she denies gait difficulty, no loss of vision, when she bending down her neck, sometimes she felt worsening numbness tingling,   She also has more numbness around the abdominal C-section scar, she has no incontinence, she denies lower extremity paresthesia    REVIEW OF SYSTEMS: Full 14 system review of systems performed and notable only for numbness, feeling cold, allergy  ALLERGIES: Allergies  Allergen Reactions  . Penicillins   . Sulfa Antibiotics     HOME MEDICATIONS: No current outpatient prescriptions on file prior to visit.   No current facility-administered medications on file prior to visit.    PAST MEDICAL HISTORY: Past Medical History  Diagnosis Date  . Reflux   . Numbness in both hands     PAST SURGICAL HISTORY: Past Surgical History  Procedure Laterality Date  . Cesarean section      FAMILY HISTORY: Family History  Problem Relation Age of Onset  . Heart failure Father   . Cancer Father     SOCIAL HISTORY:  History   Social History  . Marital Status: Married    Spouse Name: Kendra Day    Number of Children: 2  . Years of Education: college   Occupational History  .      [employer]   Social History Main Topics  . Smoking status: Never Smoker   . Smokeless tobacco: Never Used  . Alcohol Use: 0.6 oz/week    1 Glasses of wine per week  . Drug Use: No  .  Sexual Activity: Not on file   Other Topics Concern  . Not on file   Social History Narrative   Patient lives at home with her Kendra Day) and has has a college education.   Patient works for [employer] education   Caffeine 2-3 cups of caffeine daily   Right handed     PHYSICAL EXAM   Filed Vitals:   01/18/14 0759  BP: 141/82  Pulse: 77  Height: 5\' 5"  (1.651 m)  Weight: 183 lb (83.008 kg)    Not recorded    Body mass index is 30.45 kg/(m^2).   Generalized: In no acute distress  Neck: Supple, no carotid bruits   Cardiac: Regular rate rhythm  Pulmonary: Clear to auscultation bilaterally  Musculoskeletal: No deformity  Neurological examination  Mentation: Alert oriented to time, place, history taking, and causual conversation  Cranial nerve II-XII: Pupils were equal round reactive to light. Extraocular movements were full.  Visual field were full on confrontational test. Bilateral fundi were sharp.  Facial sensation and strength were normal. Hearing was intact to finger rubbing bilaterally. Uvula tongue midline.  Head turning and shoulder shrug and were normal and symmetric.Tongue protrusion into cheek strength was normal.  Motor: Normal tone, bulk and strength.  Sensory: Intact to fine touch, pinprick, preserved vibratory sensation, and proprioception at toes, with exception of decreased pinprick at first a 3 fingerpads, left wrist Tinel's sign was present.  Coordination: Normal  finger to nose, heel-to-shin bilaterally there was no truncal ataxia  Gait: Rising up from seated position without assistance, normal stance, without trunk ataxia, moderate stride, good arm swing, smooth turning, able to perform tiptoe, and heel walking without difficulty.   Romberg signs: Negative  Deep tendon reflexes: Brachioradialis 2/2, biceps 2/2, triceps 2/2, patellar 2/2, Achilles 2/2, plantar responses were flexor bilaterally.   DIAGNOSTIC DATA (LABS, IMAGING,  TESTING) - I reviewed patient records, labs, notes, testing and imaging myself where available.  ASSESSMENT AND PLAN  Kendra Day is a 43 y.o. female complains of 2 weeks history of bilateral hands paresthesia, left worse than right, on examination, she has decreased pinprick at first 3 fingerpads, left wrist Tinel's sign, most consistent with carpal tunnel syndromes  1 EMG nerve conduction study 2 wrist splint and NSAIDs when necessary. Levert Feinstein.    Livio Ledwith, M.D. Ph.D.  [facility] [address], [address] [place], [place] [address] [phone]

## 2014-01-28 ENCOUNTER — Encounter: Payer: BC Managed Care – PPO | Admitting: Neurology

## 2014-02-01 ENCOUNTER — Telehealth: Payer: Self-pay | Admitting: Neurology

## 2014-02-01 NOTE — Telephone Encounter (Signed)
Patient calling to state that she received call from her pharmacy regarding a script ready for her to pick up, patient is confused because it was never mentioned during her visit with Dr. Terrace ArabiaYan and patient looked it up and says it's for schizophrenia. Patient is very confused, please call and advise.

## 2014-02-01 NOTE — Telephone Encounter (Signed)
By viewing last OV note, it says:  1 EMG nerve conduction study  2 wrist splint and NSAIDs when necessary.  It appears Seroquel was entered/discontinued on 02/16.   Order History Outpatient    Date/Time Action Taken User Additional Information   01/18/14 1018 Sign Levert FeinsteinYijun Yan, MD    01/18/14 1044 Discontinue Levert FeinsteinYijun Yan, MD                      This Order Has Been Discontinued    Order Status By On Reason   Discontinued Levert FeinsteinYijun Yan, MD 01/18/14 1044 None         Medication Detail      Disp Refills Start End     QUEtiapine (SEROQUEL) 25 MG tablet (Discontinued) 30 tablet 12 01/18/2014 01/18/2014    Sig - Route: Take 1 tablet (25 mg total) by mouth at bedtime. - Oral    E-Prescribing Status: Receipt confirmed by pharmacy (01/18/2014 10:19 AM EST)    I called the patient.  She said she was not expecting any Rx's, and does not want to take anything.  Since her chart does not mention that we would be prescribing Seroquel, and the Rx was d/c, we called the pharmacy and asked that they d/c this order.  Pharmacist said she would.  Patient is aware.

## 2014-02-03 ENCOUNTER — Telehealth: Payer: Self-pay | Admitting: Neurology

## 2014-02-03 ENCOUNTER — Other Ambulatory Visit: Payer: Self-pay | Admitting: Neurology

## 2014-02-03 ENCOUNTER — Ambulatory Visit (INDEPENDENT_AMBULATORY_CARE_PROVIDER_SITE_OTHER): Payer: BC Managed Care – PPO

## 2014-02-03 DIAGNOSIS — IMO0001 Reserved for inherently not codable concepts without codable children: Secondary | ICD-10-CM

## 2014-02-03 DIAGNOSIS — R209 Unspecified disturbances of skin sensation: Secondary | ICD-10-CM

## 2014-02-03 DIAGNOSIS — R200 Anesthesia of skin: Secondary | ICD-10-CM

## 2014-02-03 DIAGNOSIS — K219 Gastro-esophageal reflux disease without esophagitis: Secondary | ICD-10-CM

## 2014-02-03 NOTE — Telephone Encounter (Signed)
Called patient and left her a  Message to schedule follow a  schedule with Dr.Yan MRI results.

## 2014-02-03 NOTE — Telephone Encounter (Signed)
Dana: Please give patient a followup appointment within one week

## 2014-02-04 ENCOUNTER — Telehealth: Payer: Self-pay | Admitting: Neurology

## 2014-02-04 ENCOUNTER — Other Ambulatory Visit: Payer: BC Managed Care – PPO

## 2014-02-04 ENCOUNTER — Other Ambulatory Visit: Payer: Self-pay | Admitting: Neurology

## 2014-02-04 DIAGNOSIS — R200 Anesthesia of skin: Secondary | ICD-10-CM

## 2014-02-04 MED ORDER — GADOPENTETATE DIMEGLUMINE 469.01 MG/ML IV SOLN: 15.0000 mL | Freq: Once | INTRAVENOUS | Status: AC | PRN

## 2014-02-04 NOTE — Telephone Encounter (Signed)
Patient calling wanting to know why was the additional MRI was ordered, because patient states that she has to pay the deductible and would like an exclamation why these additional test was ordered. Please would like Dr. Terrace ArabiaYan to call back. Thanks

## 2014-02-04 NOTE — Telephone Encounter (Signed)
Pt called and asked for an earlier appointment than the one that is scheduled on Monday for Dr. Terrace ArabiaYan.  She is concerned about the additional MRI's she had yesterday.  She was under the impression she was only having one.  Please call to discuss.  Thank you.

## 2014-02-04 NOTE — Telephone Encounter (Signed)
I have called moderate, MRI of the cervical was abnormal, T2 lesion, with faint enhancement,  MRI of the brain was normal, she is to keep up her followup appointment in March ninth 2015

## 2014-02-08 ENCOUNTER — Ambulatory Visit (INDEPENDENT_AMBULATORY_CARE_PROVIDER_SITE_OTHER): Payer: BC Managed Care – PPO | Admitting: Neurology

## 2014-02-08 ENCOUNTER — Encounter: Payer: Self-pay | Admitting: Neurology

## 2014-02-08 VITALS — BP 139/86 | HR 78 | Ht 65.0 in | Wt 183.0 lb

## 2014-02-08 DIAGNOSIS — M4712 Other spondylosis with myelopathy, cervical region: Secondary | ICD-10-CM

## 2014-02-08 DIAGNOSIS — G959 Disease of spinal cord, unspecified: Secondary | ICD-10-CM | POA: Insufficient documentation

## 2014-02-08 NOTE — Progress Notes (Addendum)
PATIENT: Kendra Day DOB: [birth year]  HISTORICAL  Kendra Day is a 43 years old left handed Caucasian female, referred by her primary care physician Dr. Jillyn Hidden for evaluation of bilateral hands paresthesia  She had past medical history of renal reflux developed hydronephrosis, with mild elevated creatinine at baseline, works as a Health and safety inspector job, keyboard typing, and using telephone frequently.  Over past 2 weeks, since early February 2015, she noticed bilateral hands paresthesia, left worse than right, mainly involving the middle 3 fingers, she denies significant weakness, she denies gait difficulty, no loss of vision, when she bending down her neck, sometimes she felt worsening numbness tingling,   She also has more numbness around the abdominal C-section scar, she has no incontinence, she denies lower extremity paresthesia  Laboratory from [facility] reviewed: Normal CMP, CBC, B12, LDL,  UPDATE March 9th 2015: She continued to have bilateral hands paresthesia, left worse than right, she denies gait difficulty, no incontinence. Next  We have reviewed MRI cervical spine together, T2 hyperintense lesion at C2 level, posterior and centrally. There may be questionable, faint enhancement of this lesion on post contrast views. May represent subacute inflammatory process.  MRI brain w/wo was normal.   For past 2 weeks, she also noticed shooting sensation along her spine when she bending down her neck  REVIEW OF SYSTEMS: Full 14 system review of systems performed and notable only for numbness, feeling cold, allergy  ALLERGIES: Allergies  Allergen Reactions  . Penicillins   . Sulfa Antibiotics     HOME MEDICATIONS: Current Outpatient Prescriptions on File Prior to Visit  Medication Sig Dispense Refill  . fexofenadine (ALLEGRA) 180 MG tablet Take 180 mg by mouth daily.       No current facility-administered medications on file prior to visit.    PAST MEDICAL HISTORY: Past  Medical History  Diagnosis Date  . Reflux   . Numbness in both hands     PAST SURGICAL HISTORY: Past Surgical History  Procedure Laterality Date  . Cesarean section      FAMILY HISTORY: Family History  Problem Relation Age of Onset  . Heart failure Father   . Cancer Father     SOCIAL HISTORY:  History   Social History  . Marital Status: Married    Spouse Name: Kendra Day    Number of Children: 2  . Years of Education: college   Occupational History  .      [employer]   Social History Main Topics  . Smoking status: Never Smoker   . Smokeless tobacco: Never Used  . Alcohol Use: 0.6 oz/week    1 Glasses of wine per week  . Drug Use: No  . Sexual Activity: Not on file   Other Topics Concern  . Not on file   Social History Narrative   Patient lives at home with her Kendra Day) and has has a college education.   Patient works for [employer] education   Caffeine 2-3 cups of caffeine daily   Left  handed     PHYSICAL EXAM   Filed Vitals:   02/08/14 0958  BP: 139/86  Pulse: 78  Height: 5\' 5"  (1.651 m)  Weight: 183 lb (83.008 kg)    Not recorded    Body mass index is 30.45 kg/(m^2).   Generalized: In no acute distress  Neck: Supple, no carotid bruits   Cardiac: Regular rate rhythm  Pulmonary: Clear to auscultation bilaterally  Musculoskeletal: No deformity  Neurological  examination  Mentation: Alert oriented to time, place, history taking, and causual conversation  Cranial nerve II-XII: Pupils were equal round reactive to light. Extraocular movements were full.  Visual field were full on confrontational test. Bilateral fundi were sharp.  Facial sensation and strength were normal. Hearing was intact to finger rubbing bilaterally. Uvula tongue midline.  Head turning and shoulder shrug and were normal and symmetric.Tongue protrusion into cheek strength was normal.  Motor: She has mild left shoulder abduction, external  rotation, left elbow flexion weakness  Sensory: Intact to fine touch, pinprick, preserved vibratory sensation, and proprioception at toes, with exception of decreased pinprick at first a 3 fingerpads, left wrist Tinel's sign was present.  Coordination: Normal finger to nose, heel-to-shin bilaterally there was no truncal ataxia  Gait: Rising up from seated position without assistance, normal stance, without trunk ataxia, moderate stride, good arm swing, smooth turning, able to perform tiptoe, and heel walking without difficulty.   Romberg signs: Negative  Deep tendon reflexes: Brachioradialis 2/2, biceps 2/2, triceps 2/2, patellar 2/2, Achilles 2/2, plantar responses were flexor bilaterally.   DIAGNOSTIC DATA (LABS, IMAGING, TESTING) - I reviewed patient records, labs, notes, testing and imaging myself where available.  ASSESSMENT AND PLAN  Kendra Day is a 43 y.o. female with cervical myelopathy, involving C2 levels,  1.  etiology including isolated cervical myelopathy vs. multiple sclerosis, vs. inflammatory changes due to connective tissue disease 2 laboratory evaluations 3 lumbar puncture.  4 visual evoked potential 5 return to clinic in one month. Terrilyn Saver.          [facility] [address], [address] [place], [place] [address] [phone]

## 2014-02-08 NOTE — Addendum Note (Signed)
Addended by: Levert FeinsteinYAN, Shawnta Schlegel on: 02/08/2014 11:21 AM   Modules accepted: Orders

## 2014-02-11 LAB — IFE AND PE, SERUM
ALBUMIN/GLOB SERPL: 1.4 (ref 0.7–2.0)
ALPHA2 GLOB SERPL ELPH-MCNC: 0.7 g/dL (ref 0.4–1.2)
Albumin SerPl Elph-Mcnc: 4.1 g/dL (ref 3.2–5.6)
Alpha 1: 0.2 g/dL (ref 0.1–0.4)
B-GLOBULIN SERPL ELPH-MCNC: 1 g/dL (ref 0.6–1.3)
GLOBULIN, TOTAL: 3.1 g/dL (ref 2.0–4.5)
Gamma Glob SerPl Elph-Mcnc: 1.2 g/dL (ref 0.5–1.6)
IGA/IMMUNOGLOBULIN A, SERUM: 183 mg/dL (ref 91–414)
IGG (IMMUNOGLOBIN G), SERUM: 1220 mg/dL (ref 700–1600)
IgM (Immunoglobulin M), Srm: 176 mg/dL (ref 40–230)
Total Protein: 7.2 g/dL (ref 6.0–8.5)

## 2014-02-11 LAB — HIV ANTIBODY (ROUTINE TESTING W REFLEX)
HIV 1/O/2 Abs-Index Value: <1 (ref <1.00)
HIV-1/HIV-2 Ab: NONREACTIVE

## 2014-02-11 LAB — LYME DISEASE, WESTERN BLOT
IGG P23 AB.: ABSENT
IGG P28 AB.: ABSENT
IGG P30 AB.: ABSENT
IGG P45 AB.: ABSENT
IGG P93 AB.: ABSENT
IgG P18 Ab.: ABSENT
IgG P39 Ab.: ABSENT
IgG P41 Ab.: ABSENT
IgG P58 Ab.: ABSENT
IgG P66 Ab.: ABSENT
IgM P23 Ab.: ABSENT
IgM P39 Ab.: ABSENT
IgM P41 Ab.: ABSENT
LYME IGG WB: NEGATIVE
LYME IGM WB: NEGATIVE

## 2014-02-11 LAB — SEDIMENTATION RATE: SED RATE: 8 mm/h (ref 0–32)

## 2014-02-11 LAB — THYROID PANEL WITH TSH
FREE THYROXINE INDEX: 2.2 (ref 1.2–4.9)
T3 UPTAKE RATIO: 25 % (ref 24–39)
T4, Total: 8.6 ug/dL (ref 4.5–12.0)
TSH: 0.681 u[IU]/mL (ref 0.450–4.500)

## 2014-02-11 LAB — ANA W/REFLEX IF POSITIVE: Anti Nuclear Antibody(ANA): NEGATIVE

## 2014-02-11 LAB — VITAMIN B12: VITAMIN B 12: 546 pg/mL (ref 211–946)

## 2014-02-11 LAB — RPR: RPR: NONREACTIVE

## 2014-02-11 LAB — LYME, TOTAL AB TEST/REFLEX: Lyme IgG/IgM Ab: 1.82 {ISR} — ABNORMAL HIGH (ref 0.00–0.90)

## 2014-02-11 LAB — C-REACTIVE PROTEIN: CRP: 2 mg/L (ref 0.0–4.9)

## 2014-02-11 LAB — FOLATE

## 2014-02-11 LAB — ACETYLCHOLINE RECEPTOR, MODULATING

## 2014-02-11 LAB — COPPER, SERUM: Copper: 119 ug/dL (ref 72–166)

## 2014-02-12 ENCOUNTER — Telehealth: Payer: Self-pay | Admitting: Neurology

## 2014-02-12 NOTE — Telephone Encounter (Signed)
I have called Kendra Day,  Her labs showed positive Lyme titer, 1 point 8 2, she denied previous exposure to tic bites, rest of the blood test was normal, will repeat Lyme serology test on her next visit  She should continue followup

## 2014-02-15 ENCOUNTER — Ambulatory Visit (INDEPENDENT_AMBULATORY_CARE_PROVIDER_SITE_OTHER): Payer: BC Managed Care – PPO

## 2014-02-15 ENCOUNTER — Telehealth: Payer: Self-pay | Admitting: Neurology

## 2014-02-15 DIAGNOSIS — K219 Gastro-esophageal reflux disease without esophagitis: Principal | ICD-10-CM

## 2014-02-15 DIAGNOSIS — R209 Unspecified disturbances of skin sensation: Secondary | ICD-10-CM

## 2014-02-15 DIAGNOSIS — H5310 Unspecified subjective visual disturbances: Secondary | ICD-10-CM

## 2014-02-15 DIAGNOSIS — R200 Anesthesia of skin: Secondary | ICD-10-CM

## 2014-02-15 DIAGNOSIS — G959 Disease of spinal cord, unspecified: Secondary | ICD-10-CM

## 2014-02-15 DIAGNOSIS — IMO0001 Reserved for inherently not codable concepts without codable children: Secondary | ICD-10-CM

## 2014-02-15 NOTE — Telephone Encounter (Signed)
Please call patient when you know the exact date for  fluro-guided lumbar puncture

## 2014-02-15 NOTE — Telephone Encounter (Signed)
Patient at check out today checking up on the status of when her lumbar puncture will be scheduled since she has not heard anything about it yet. Please call patient and advise.

## 2014-02-15 NOTE — Procedures (Signed)
    History:   Kendra Day is a 43 year old patient with a history of paresthesias involving the upper extremities. MRI the brain was relatively unremarkable, but there may be a T2 lesion in the spinal cord at the C2 level. The patient is being evaluated for possible demyelinating disease.  Description: The visual evoked response test was performed today using 32 x 32 check sizes. The absolute latencies for the N1 and the P100 wave forms were within normal limits bilaterally. The amplitudes for the P100 wave forms were also within normal limits bilaterally. The visual acuity was 20/30 OD and 20/30 OS corrected.  Impression:  The visual evoked response test above was within normal limits bilaterally. No evidence of conduction slowing was seen within the anterior visual pathways on either side on today's evaluation.

## 2014-02-15 NOTE — Telephone Encounter (Signed)
Patient is calling wondering when her LP would be scheduled. I looked in chart and did not see an order for this. Dr. Terrace ArabiaYan could you please put order in for LP. Thanks

## 2014-02-16 NOTE — Telephone Encounter (Signed)
Asking Dr.Yan to place orders and I will call patient .Lumbar puncture.

## 2014-02-16 NOTE — Telephone Encounter (Signed)
Dana, Lumbar puncture order was placed. Please arrange.

## 2014-02-19 ENCOUNTER — Ambulatory Visit
Admission: RE | Admit: 2014-02-19 | Discharge: 2014-02-19 | Disposition: A | Discharge status: Home / Self Care | Payer: BC Managed Care – PPO | Source: Ambulatory Visit | Attending: Neurology | Admitting: Neurology

## 2014-02-19 ENCOUNTER — Telehealth: Payer: Self-pay | Admitting: Neurology

## 2014-02-19 VITALS — BP 131/78 | HR 77

## 2014-02-19 DIAGNOSIS — IMO0001 Reserved for inherently not codable concepts without codable children: Secondary | ICD-10-CM

## 2014-02-19 DIAGNOSIS — K219 Gastro-esophageal reflux disease without esophagitis: Principal | ICD-10-CM

## 2014-02-19 DIAGNOSIS — G959 Disease of spinal cord, unspecified: Secondary | ICD-10-CM

## 2014-02-19 DIAGNOSIS — R200 Anesthesia of skin: Secondary | ICD-10-CM

## 2014-02-19 LAB — GRAM STAIN
Gram Stain: NONE SEEN
Gram Stain: NONE SEEN

## 2014-02-19 LAB — CSF CELL COUNT WITH DIFFERENTIAL
RBC Count, CSF: 0 cu mm
Tube #: 3
WBC, CSF: 1 cu mm (ref 0–5)

## 2014-02-19 LAB — GLUCOSE, CSF: Glucose, CSF: 54 mg/dL (ref 43–76)

## 2014-02-19 LAB — PROTEIN, CSF: Total Protein, CSF: 31 mg/dL (ref 15–45)

## 2014-02-19 NOTE — Progress Notes (Signed)
Blood drawn from right AC. Site is unremarkable and pt tolerated procedure well. Discharge instructions explained to pt.

## 2014-02-19 NOTE — Telephone Encounter (Signed)
Normal CSF, MS panel  is pending

## 2014-02-19 NOTE — Discharge Instructions (Signed)

## 2014-02-20 LAB — VDRL, CSF: SYPHILIS VDRL QUANT CSF: NONREACTIVE

## 2014-02-21 ENCOUNTER — Emergency Department (HOSPITAL_COMMUNITY)
Admission: EM | Admit: 2014-02-21 | Discharge: 2014-02-21 | Disposition: A | Discharge status: Home / Self Care | Payer: BC Managed Care – PPO | Attending: Emergency Medicine | Admitting: Emergency Medicine

## 2014-02-21 ENCOUNTER — Encounter (HOSPITAL_COMMUNITY): Payer: Self-pay | Admitting: Emergency Medicine

## 2014-02-21 DIAGNOSIS — R110 Nausea: Secondary | ICD-10-CM

## 2014-02-21 DIAGNOSIS — M545 Low back pain, unspecified: Secondary | ICD-10-CM | POA: Insufficient documentation

## 2014-02-21 DIAGNOSIS — G971 Other reaction to spinal and lumbar puncture: Secondary | ICD-10-CM | POA: Insufficient documentation

## 2014-02-21 DIAGNOSIS — Z880 Allergy status to penicillin: Secondary | ICD-10-CM | POA: Insufficient documentation

## 2014-02-21 DIAGNOSIS — Z8719 Personal history of other diseases of the digestive system: Secondary | ICD-10-CM | POA: Insufficient documentation

## 2014-02-21 DIAGNOSIS — M542 Cervicalgia: Secondary | ICD-10-CM | POA: Insufficient documentation

## 2014-02-21 DIAGNOSIS — H53149 Visual discomfort, unspecified: Secondary | ICD-10-CM | POA: Insufficient documentation

## 2014-02-21 LAB — CBC
HEMATOCRIT: 41.6 % (ref 36.0–46.0)
HEMOGLOBIN: 14.6 g/dL (ref 12.0–15.0)
MCH: 29.9 pg (ref 26.0–34.0)
MCHC: 35.1 g/dL (ref 30.0–36.0)
MCV: 85.2 fL (ref 78.0–100.0)
Platelets: 218 10*3/uL (ref 150–400)
RBC: 4.88 MIL/uL (ref 3.87–5.11)
RDW: 12.6 % (ref 11.5–15.5)
WBC: 7.6 10*3/uL (ref 4.0–10.5)

## 2014-02-21 LAB — BASIC METABOLIC PANEL
BUN: 20 mg/dL (ref 6–23)
CO2: 25 mEq/L (ref 19–32)
CREATININE: 1.02 mg/dL (ref 0.50–1.10)
Calcium: 9.6 mg/dL (ref 8.4–10.5)
Chloride: 103 mEq/L (ref 96–112)
GFR calc non Af Amer: 66 mL/min — ABNORMAL LOW (ref >90)
GFR, EST AFRICAN AMERICAN: 76 mL/min — AB (ref >90)
GLUCOSE: 104 mg/dL — AB (ref 70–99)
POTASSIUM: 4.1 meq/L (ref 3.7–5.3)
Sodium: 141 mEq/L (ref 137–147)

## 2014-02-21 MED ORDER — SODIUM CHLORIDE 0.9 % IV BOLUS (SEPSIS)
1000.0000 mL | Freq: Once | INTRAVENOUS | Status: AC
  Administered 2014-02-21: 1000 mL via INTRAVENOUS

## 2014-02-21 MED ORDER — DIPHENHYDRAMINE HCL 50 MG/ML IJ SOLN
25.0000 mg | Freq: Once | INTRAMUSCULAR | Status: AC
  Administered 2014-02-21: 25 mg via INTRAVENOUS
  Filled 2014-02-21: qty 1

## 2014-02-21 MED ORDER — ONDANSETRON HCL 4 MG/2ML IJ SOLN
4.0000 mg | Freq: Once | INTRAMUSCULAR | Status: AC
  Administered 2014-02-21: 4 mg via INTRAVENOUS
  Filled 2014-02-21: qty 2

## 2014-02-21 MED ORDER — PROMETHAZINE HCL 25 MG PO TABS: 25.0000 mg | ORAL_TABLET | Freq: Four times a day (QID) | ORAL | Status: DC | PRN

## 2014-02-21 MED ORDER — METOCLOPRAMIDE HCL 5 MG/ML IJ SOLN
10.0000 mg | Freq: Once | INTRAMUSCULAR | Status: AC
  Administered 2014-02-21: 10 mg via INTRAVENOUS
  Filled 2014-02-21: qty 2

## 2014-02-21 MED ORDER — DEXAMETHASONE SODIUM PHOSPHATE 10 MG/ML IJ SOLN
10.0000 mg | Freq: Once | INTRAMUSCULAR | Status: AC
  Administered 2014-02-21: 10 mg via INTRAVENOUS
  Filled 2014-02-21: qty 1

## 2014-02-21 MED ORDER — ONDANSETRON 4 MG PO TBDP: 4.0000 mg | ORAL_TABLET | Freq: Three times a day (TID) | ORAL | Status: DC | PRN

## 2014-02-21 NOTE — ED Provider Notes (Signed)
CSN: [id number]     Arrival date & time 02/21/14  1045 History   First MD Initiated Contact with Patient 02/21/14 1108     Chief Complaint  Patient presents with  . Back Pain  . Neck Pain  . Headache     (Consider location/radiation/quality/duration/timing/severity/associated sxs/prior Treatment) HPI Comments: Kendra Day is a 43 y.o. female presenting the Emergency Department with a chief complaint of post LP headache.  The patient reports LP on 02/19/2014.  She states she layed flat for 24 hours after procedure and drinking a large amount of caffeine over the past two days.  She reports after getting out of bed to shower she started having a generalized headache and neck pain.  She reports headache improved with laying down.  She reports laying down relieves pressure in her head and neck, but increases pressure in her low back.  Denies fever. Neurologist: Dr. Terrace Arabia   The history is provided by the patient and medical records. No language interpreter was used.    Past Medical History  Diagnosis Date  . Reflux   . Numbness in both hands    Past Surgical History  Procedure Laterality Date  . Cesarean section     Family History  Problem Relation Age of Onset  . Heart failure Father   . Cancer Father    History  Substance Use Topics  . Smoking status: Never Smoker   . Smokeless tobacco: Never Used  . Alcohol Use: 0.6 oz/week    1 Glasses of wine per week   OB History   Grav Para Term Preterm Abortions TAB SAB Ect Mult Living                 Review of Systems  Constitutional: Negative for fever and chills.  Eyes: Positive for photophobia. Negative for visual disturbance.  Gastrointestinal: Positive for nausea. Negative for vomiting and abdominal pain.  Musculoskeletal: Positive for back pain and neck pain.  Neurological: Positive for headaches.      Allergies  Penicillins and Sulfa antibiotics  Home Medications   Current Outpatient Rx  Name  Route  Sig   Dispense  Refill  . fexofenadine (ALLEGRA) 180 MG tablet   Oral   Take 180 mg by mouth daily as needed for allergies.           BP 146/81  Pulse 86  Temp(Src) 98.3 F (36.8 C)  Resp 18  SpO2 100%  LMP 02/19/2014 Physical Exam  Nursing note and vitals reviewed. Constitutional: She is oriented to person, place, and time. She appears well-developed and well-nourished. No distress.  HENT:  Head: Normocephalic and atraumatic.  Eyes: EOM are normal. Pupils are equal, round, and reactive to light. No scleral icterus.  Neck: Neck supple.  Cardiovascular: Normal rate, regular rhythm and normal heart sounds.   No murmur heard. Pulmonary/Chest: Effort normal and breath sounds normal. She has no wheezes.  Abdominal: Soft. Bowel sounds are normal. There is no tenderness. There is no rebound and no guarding.  Musculoskeletal: Normal range of motion. She exhibits no edema.       Back:  Moves all 4 extremities. Single pin point lesion to mid L spine, no erythremia, non-tender to palpation, no signs of abscess.   Neurological: She is alert and oriented to person, place, and time. No cranial nerve deficit. Coordination and gait normal. GCS eye subscore is 4. GCS verbal subscore is 5. GCS motor subscore is 6.  Speech is clear and goal oriented,  follows commands Cranial nerves III - XII grossly intact, no facial droop Normal strength in upper and lower extremities bilaterally, strong and equal grip strength Sensation normal to light touch Moves all 4 extremities without ataxia, coordination intact  Skin: Skin is warm and dry. No rash noted.  Psychiatric: She has a normal mood and affect. Her behavior is normal. Thought content normal.    ED Course  Procedures (including critical care time) Labs Review Labs Reviewed  BASIC METABOLIC PANEL - Abnormal; Notable for the following:    Glucose, Bld 104 (*)    GFR calc non Af Amer 66 (*)    GFR calc Af Amer 76 (*)    All other components within  normal limits  CBC   Imaging Review No results found.   EKG Interpretation None      MDM   Final diagnoses:  Headache, lumbar puncture  Nausea   Pt with Headache, s/p LP 2 days ago.  Currently being evaluated for a possible demyelinating neuro disorder.  Pt afebrile no signs of abscess or infection at LP site. EMR shows normal CSF studies as of today. Discussed patient history, condition, and labs with Dr. Fredderick PhenixBelfi, advises migraine cocktail, IV caffeine, and re-evaluate. Consult to pharmacist. No IV caffeine in house and check with MAU. MAU without IV caffeine as well.  Consult to Anesthsia, advises to contact radiology for possible blood patch. Re-evaluation Pt reports persistent symptoms. Discussed pt history, condition, and labs with Dr. Tenny Crawoss who advises blood patch 72 hour after LP.  Have the pt continue to lay flat, consume caffeine and follow up with his office if still symptomatic tomorrow. Re-eval pt reports partial relief of symptoms with medication and fluids, states symptoms with ambulation or sitting upright have decreased. Discussed lab results, and treatment plan with the patient and the patient's husband. Return precautions given. Reports understanding and no other concerns at this time.  Patient is stable for discharge at this time.  Meds given in ED:  Medications  sodium chloride 0.9 % bolus 1,000 mL (0 mLs Intravenous Stopped 02/21/14 1325)  metoCLOPramide (REGLAN) injection 10 mg (10 mg Intravenous Given 02/21/14 1144)  diphenhydrAMINE (BENADRYL) injection 25 mg (25 mg Intravenous Given 02/21/14 1144)  dexamethasone (DECADRON) injection 10 mg (10 mg Intravenous Given 02/21/14 1144)  ondansetron (ZOFRAN) injection 4 mg (4 mg Intravenous Given 02/21/14 1144)  sodium chloride 0.9 % bolus 1,000 mL (0 mLs Intravenous Stopped 02/21/14 1601)    Discharge Medication List as of 02/21/2014  3:33 PM    START taking these medications   Details  ondansetron (ZOFRAN ODT) 4 MG  disintegrating tablet Take 1 tablet (4 mg total) by mouth every 8 (eight) hours as needed for nausea or vomiting., Starting 02/21/2014, Until Discontinued, Print    promethazine (PHENERGAN) 25 MG tablet Take 1 tablet (25 mg total) by mouth every 6 (six) hours as needed for nausea or vomiting., Starting 02/21/2014, Until Discontinued, Print               Clabe SealLauren M Lilibeth Opie, PA-C 02/23/14 1145

## 2014-02-21 NOTE — ED Notes (Signed)
Minimal pain while lying down, pain 5/10 when sitting up.  Pain in lower back 2/10.

## 2014-02-21 NOTE — ED Notes (Signed)
Pt. Stated, i had a Lumber Puncture on Friday and yesterday after lying for 24 hours started having neck, head and back pain.

## 2014-02-21 NOTE — ED Notes (Signed)
Onset Friday had lumbar puncture to r/o MS.  Pt laid flat except up to bathroom until Saturday morning then started getting headache, neck and back pain when sitting or standing.  No vomiting. Headache dull when laying.  No other s/s noted.

## 2014-02-21 NOTE — ED Notes (Signed)
Pt up ambulated to bathrrom, pt states that headache decreased, pain is tolerable.

## 2014-02-21 NOTE — Discharge Instructions (Signed)
Call Dr. Tenny Crawoss for a possible blood patch if you continue to have a headache tomorrow. [phone] Return to the ED if your symptoms worsen.   Take medication as prescribed.  You can take the Zofran or Phenergan for nausea. Continue to drink caffeine as previously advised.  Continue to lay flat for 24 hours.

## 2014-02-21 NOTE — ED Notes (Signed)
Pt has been drinking lots of caffeine since Friday with no improvement in headache.

## 2014-02-22 ENCOUNTER — Ambulatory Visit
Admission: RE | Admit: 2014-02-22 | Discharge: 2014-02-22 | Disposition: A | Discharge status: Home / Self Care | Payer: BC Managed Care – PPO | Source: Ambulatory Visit | Attending: Neurology | Admitting: Neurology

## 2014-02-22 ENCOUNTER — Other Ambulatory Visit: Payer: Self-pay | Admitting: Neurology

## 2014-02-22 ENCOUNTER — Telehealth: Payer: Self-pay | Admitting: Neurology

## 2014-02-22 ENCOUNTER — Telehealth: Payer: Self-pay | Admitting: Radiology

## 2014-02-22 DIAGNOSIS — M4712 Other spondylosis with myelopathy, cervical region: Secondary | ICD-10-CM

## 2014-02-22 MED ORDER — IOHEXOL 180 MG/ML  SOLN
1.0000 mL | Freq: Once | INTRAMUSCULAR | Status: AC | PRN
  Administered 2014-02-22: 1 mL via EPIDURAL

## 2014-02-22 NOTE — Telephone Encounter (Signed)
Pt called back and asked for recommendation for another neurologist and explained we could not do this but her primary could and if she got the order for a blood patch we would be glad to get her in ASAP.

## 2014-02-22 NOTE — Telephone Encounter (Signed)
I have called Kendra Day, she is tearful, complains of severe headache as soon as she sits up, she has tried fluid, lying flat, drink coffee containing beverage without helping,  Will order blood patch.

## 2014-02-22 NOTE — Telephone Encounter (Signed)
Pt calling requesting a order be put in for an blood patch. Pt stated that she had a LP done on Friday. Please advise

## 2014-02-22 NOTE — Telephone Encounter (Signed)
Had LP done on Friday--needs order for blood patch today please--please call.

## 2014-02-22 NOTE — Telephone Encounter (Signed)
Pt called for us to do blood patch. She was in the ER over the weekend and the ER told her to call here for a Dr. Tenny Crawoss. Explained she would have to call Dr. Zannie CoveYan's office to get the order for the blood patch. Pt in tears wants this done today, she needs to get back to work. Explained she would need to state this emphractically and make her self heard. But that we needed an order.

## 2014-02-22 NOTE — Progress Notes (Signed)
20cc blood drawn without difficulty from left AC space for blood patch; site unremarkable.  jkl

## 2014-02-22 NOTE — Telephone Encounter (Signed)
Blood patch order in and left [facility].

## 2014-02-24 LAB — MULTIPLE SCLEROSIS PANEL 2
Albumin CSF: 15 mg/dL
Albumin Index: 3.6
Albumin: 4140 mg/dL (ref 3700–5410)
CNS-IgG Synthesis Rate: <0.1 mg/(24.h)
IgA CSF: 0.14 mg/dL — ABNORMAL LOW (ref 0.15–0.60)
IgA MSPROF: <0.001 mg/dL
IgA Total: 186 mg/dL (ref 81–463)
IgG Total CSF: 1.9 mg/dL (ref 0.5–6.1)
IgG Total: 1192 mg/dL (ref 694–1618)
IgG-Index: 0.44
IgG: <0.01 mg/dL
IgM Total: 176 mg/dL (ref 48–271)
IgM-CSF: <0.03 mg/dL
Myelin basic protein, csf: <2 ug/L

## 2014-02-24 NOTE — ED Provider Notes (Signed)
Medical screening examination/treatment/procedure(s) were performed by non-physician practitioner and as supervising physician I was immediately available for consultation/collaboration.   EKG Interpretation None        Liset Mcmonigle, MD 02/24/14 0707 

## 2014-02-25 ENCOUNTER — Telehealth: Payer: Self-pay | Admitting: Neurology

## 2014-02-25 NOTE — Telephone Encounter (Signed)
I called patient. The spinal fluid results are unremarkable, not diagnostic of multiple sclerosis. One band was seen in the spinal fluid. The visual evoked response test also was normal.

## 2014-03-18 ENCOUNTER — Ambulatory Visit (INDEPENDENT_AMBULATORY_CARE_PROVIDER_SITE_OTHER): Payer: BC Managed Care – PPO | Admitting: Neurology

## 2014-03-18 ENCOUNTER — Encounter: Payer: Self-pay | Admitting: Neurology

## 2014-03-18 VITALS — BP 132/82 | HR 74 | Ht 65.0 in | Wt 187.0 lb

## 2014-03-18 DIAGNOSIS — M4712 Other spondylosis with myelopathy, cervical region: Secondary | ICD-10-CM

## 2014-03-18 DIAGNOSIS — G959 Disease of spinal cord, unspecified: Secondary | ICD-10-CM

## 2014-03-18 DIAGNOSIS — R200 Anesthesia of skin: Secondary | ICD-10-CM

## 2014-03-18 DIAGNOSIS — R209 Unspecified disturbances of skin sensation: Secondary | ICD-10-CM

## 2014-03-18 NOTE — Progress Notes (Signed)
PATIENT: Kendra Day DOB: 07/06/1971  HISTORICAL  Kendra Day is a 43 years old left handed Caucasian female, referred by her primary care physician Dr. Jillyn Day for evaluation of bilateral hands paresthesia  She had past medical history of renal reflux developed hydronephrosis, with mild elevated creatinine at baseline, works as a Health and safety inspectordesk job, keyboard typing, and using telephone frequently.  Over past 2 weeks, since early February 2015, she noticed bilateral hands paresthesia, left worse than right, mainly involving the middle 3 fingers, she denies significant weakness, she denies gait difficulty, no loss of vision, when she bending down her neck, sometimes she felt worsening numbness tingling,   She also has more numbness around the abdominal C-section scar, she has no incontinence, she denies lower extremity paresthesia  Laboratory from [facility] reviewed: Normal CMP, CBC, B12, LDL,  UPDATE March 9th 2015: She continued to have bilateral hands paresthesia, left worse than right, she denies gait difficulty, no incontinence. Next  We have reviewed MRI cervical spine together, T2 hyperintense lesion at C2 level, posterior and centrally. There may be questionable, faint enhancement of this lesion on post contrast views. May represent subacute inflammatory process.  MRI brain w/wo was normal.   For past 2 weeks, she also noticed shooting sensation along her spine when she bending down her neck  UPDATE April 16th 2015: She has slight improvement over her symptoms, but she continued to have shooting sensation along her spine when she bending her neck, intermittent left arm, left lateral leg paresthesia, she denies gait difficulty, no bowel bladder incontinence, no visual loss,  We have reviewed extensive evaluations, visual evoked potential was normal, laboratory showed normal or negative CBC, CMP, RPR, copper level, B12, ANA, CSF was essentially normal, there was one oligoclonal  banding, not enough evidence to support a diagnosis of multiple sclerosis. Lyme titer was positive on ELISA, negative on Western Blot  REVIEW OF SYSTEMS: Full 14 system review of systems performed and notable only for  hearing loss, ringing in ears, walking difficulties, speech difficulty  ALLERGIES: Allergies  Allergen Reactions  . Penicillins Hives  . Sulfa Antibiotics Hives    HOME MEDICATIONS: Current Outpatient Prescriptions on File Prior to Visit  Medication Sig Dispense Refill  . fexofenadine (ALLEGRA) 180 MG tablet Take 180 mg by mouth daily as needed for allergies.        No current facility-administered medications on file prior to visit.    PAST MEDICAL HISTORY: Past Medical History  Diagnosis Date  . Reflux   . Numbness in both hands     PAST SURGICAL HISTORY: Past Surgical History  Procedure Laterality Date  . Cesarean section      FAMILY HISTORY: Family History  Problem Relation Age of Onset  . Heart failure Father   . Cancer Father     SOCIAL HISTORY:  History   Social History  . Marital Status: Married    Spouse Name: Kendra MaduroRobert    Number of Children: 2  . Years of Education: college   Occupational History  .      Kendra Day   Social History Main Topics  . Smoking status: Never Smoker   . Smokeless tobacco: Never Used  . Alcohol Use: 0.6 oz/week    1 Cans of beer per week  . Drug Use: No  . Sexual Activity: Not on file   Other Topics Concern  . Not on file   Social History Narrative   Patient lives at home with her  husband  Kendra Maduro([name]) and has has a Naval architectcollege education.   Patient works for [employer].   Left handed   Caffeine 2-3 cups daily.              PHYSICAL EXAM   Filed Vitals:   03/18/14 0830  BP: 132/82  Pulse: 74  Height: 5\' 5"  (1.651 m)  Weight: 187 lb (84.823 kg)    Not recorded    Body mass index is 31.12 kg/(m^2).   Generalized: In no acute distress  Neck: Supple, no carotid bruits    Cardiac: Regular rate rhythm  Pulmonary: Clear to auscultation bilaterally  Musculoskeletal: No deformity  Neurological examination  Mentation: Alert oriented to time, place, history taking, and causual conversation  Cranial nerve II-XII: Pupils were equal round reactive to light. Extraocular movements were full.  Visual field were full on confrontational test. Bilateral fundi were sharp.  Facial sensation and strength were normal. Hearing was intact to finger rubbing bilaterally. Uvula tongue midline.  Head turning and shoulder shrug and were normal and symmetric.Tongue protrusion into cheek strength was normal.  Motor: She has mild left shoulder abduction, external rotation, left elbow flexion weakness  Sensory: Intact to fine touch, pinprick, preserved vibratory sensation, and proprioception at toes, with exception of decreased pinprick at first a 3 fingerpads, left wrist Tinel's sign was present.  Coordination: Normal finger to nose, heel-to-shin bilaterally there was no truncal ataxia  Gait: Rising up from seated position without assistance, normal stance, without trunk ataxia, moderate stride, good arm swing, smooth turning, able to perform tiptoe, and heel walking without difficulty.   Romberg signs: Negative  Deep tendon reflexes: Brachioradialis 2/2, biceps 2/2, triceps 2/2, patellar 2/2, Achilles 2/2, plantar responses were flexor bilaterally.   DIAGNOSTIC DATA (LABS, IMAGING, TESTING) - I reviewed patient records, labs, notes, testing and imaging myself where available.  ASSESSMENT AND PLAN  Kendra Day is a 43 y.o. female with  idiopathic cervical myelopathy, involving C2 levels,  Extensive evaluation failed to demonstrate etiology, CSF study was essentially normal, there was only one oligoclonal banding, normal visual evoked potential, there was not enough evidence to support diagnosis of multiple sclerosis,  She will only return to clinic if new issue  arise       Terrilyn SaverYijun Lolamae Voisin [facility] [address], [address] [place], [place] [address] [phone]

## 2014-03-19 LAB — FUNGUS CULTURE W SMEAR: SMEAR RESULT: NONE SEEN

## 2014-05-10 ENCOUNTER — Other Ambulatory Visit: Payer: Self-pay | Admitting: Obstetrics and Gynecology

## 2014-05-10 DIAGNOSIS — R921 Mammographic calcification found on diagnostic imaging of breast: Secondary | ICD-10-CM

## 2014-05-20 ENCOUNTER — Ambulatory Visit
Admission: RE | Admit: 2014-05-20 | Discharge: 2014-05-20 | Disposition: A | Discharge status: Home / Self Care | Payer: BC Managed Care – PPO | Source: Ambulatory Visit | Attending: Obstetrics and Gynecology | Admitting: Obstetrics and Gynecology

## 2014-05-20 DIAGNOSIS — R921 Mammographic calcification found on diagnostic imaging of breast: Secondary | ICD-10-CM

## 2014-09-17 ENCOUNTER — Other Ambulatory Visit: Payer: Self-pay

## 2014-11-01 ENCOUNTER — Other Ambulatory Visit: Payer: Self-pay | Admitting: Obstetrics and Gynecology

## 2014-11-01 DIAGNOSIS — R921 Mammographic calcification found on diagnostic imaging of breast: Secondary | ICD-10-CM

## 2014-11-15 ENCOUNTER — Ambulatory Visit
Admission: RE | Admit: 2014-11-15 | Discharge: 2014-11-15 | Disposition: A | Discharge status: Home / Self Care | Payer: BC Managed Care – PPO | Source: Ambulatory Visit | Attending: Obstetrics and Gynecology | Admitting: Obstetrics and Gynecology

## 2014-11-15 DIAGNOSIS — R921 Mammographic calcification found on diagnostic imaging of breast: Secondary | ICD-10-CM

## 2015-04-22 ENCOUNTER — Other Ambulatory Visit: Payer: Self-pay | Admitting: Obstetrics and Gynecology

## 2015-04-22 DIAGNOSIS — R921 Mammographic calcification found on diagnostic imaging of breast: Secondary | ICD-10-CM

## 2015-05-23 ENCOUNTER — Ambulatory Visit
Admission: RE | Admit: 2015-05-23 | Discharge: 2015-05-23 | Disposition: A | Discharge status: Home / Self Care | Payer: BLUE CROSS/BLUE SHIELD | Source: Ambulatory Visit | Attending: Obstetrics and Gynecology | Admitting: Obstetrics and Gynecology

## 2015-05-23 DIAGNOSIS — R921 Mammographic calcification found on diagnostic imaging of breast: Secondary | ICD-10-CM

## 2016-04-18 ENCOUNTER — Other Ambulatory Visit: Payer: Self-pay

## 2016-04-18 DIAGNOSIS — Z139 Encounter for screening, unspecified: Secondary | ICD-10-CM

## 2016-05-24 ENCOUNTER — Ambulatory Visit: Payer: BLUE CROSS/BLUE SHIELD

## 2016-06-12 ENCOUNTER — Ambulatory Visit
Admission: RE | Admit: 2016-06-12 | Discharge: 2016-06-12 | Disposition: A | Discharge status: Home / Self Care | Payer: Managed Care, Other (non HMO) | Source: Ambulatory Visit

## 2016-06-12 DIAGNOSIS — Z139 Encounter for screening, unspecified: Secondary | ICD-10-CM

## 2016-10-02 ENCOUNTER — Other Ambulatory Visit: Payer: Self-pay | Admitting: Family Medicine

## 2016-10-02 DIAGNOSIS — R221 Localized swelling, mass and lump, neck: Secondary | ICD-10-CM

## 2016-10-08 ENCOUNTER — Ambulatory Visit
Admission: RE | Admit: 2016-10-08 | Discharge: 2016-10-08 | Disposition: A | Discharge status: Home / Self Care | Payer: Managed Care, Other (non HMO) | Source: Ambulatory Visit | Attending: Family Medicine | Admitting: Family Medicine

## 2016-10-08 DIAGNOSIS — R221 Localized swelling, mass and lump, neck: Secondary | ICD-10-CM

## 2016-10-09 ENCOUNTER — Other Ambulatory Visit: Payer: Self-pay | Admitting: Family Medicine

## 2016-10-09 DIAGNOSIS — E041 Nontoxic single thyroid nodule: Secondary | ICD-10-CM

## 2016-10-09 DIAGNOSIS — E010 Iodine-deficiency related diffuse (endemic) goiter: Secondary | ICD-10-CM

## 2016-10-11 ENCOUNTER — Ambulatory Visit
Admission: RE | Admit: 2016-10-11 | Discharge: 2016-10-11 | Disposition: A | Discharge status: Home / Self Care | Payer: Managed Care, Other (non HMO) | Source: Ambulatory Visit | Attending: Family Medicine | Admitting: Family Medicine

## 2016-10-11 ENCOUNTER — Other Ambulatory Visit (HOSPITAL_COMMUNITY)
Admission: RE | Admit: 2016-10-11 | Discharge: 2016-10-11 | Disposition: A | Discharge status: Home / Self Care | Payer: Managed Care, Other (non HMO) | Source: Ambulatory Visit | Attending: Radiology | Admitting: Radiology

## 2016-10-11 DIAGNOSIS — E041 Nontoxic single thyroid nodule: Secondary | ICD-10-CM | POA: Diagnosis present

## 2016-10-11 DIAGNOSIS — E010 Iodine-deficiency related diffuse (endemic) goiter: Secondary | ICD-10-CM

## 2016-11-09 ENCOUNTER — Other Ambulatory Visit: Payer: Self-pay | Admitting: Surgery

## 2016-11-09 DIAGNOSIS — E041 Nontoxic single thyroid nodule: Secondary | ICD-10-CM

## 2016-12-04 ENCOUNTER — Other Ambulatory Visit: Payer: Managed Care, Other (non HMO)

## 2016-12-05 ENCOUNTER — Ambulatory Visit
Admission: RE | Admit: 2016-12-05 | Discharge: 2016-12-05 | Disposition: A | Discharge status: Home / Self Care | Payer: Managed Care, Other (non HMO) | Source: Ambulatory Visit | Attending: Surgery | Admitting: Surgery

## 2016-12-05 ENCOUNTER — Other Ambulatory Visit (HOSPITAL_COMMUNITY)
Admission: RE | Admit: 2016-12-05 | Discharge: 2016-12-05 | Disposition: A | Discharge status: Home / Self Care | Payer: Managed Care, Other (non HMO) | Source: Ambulatory Visit | Attending: Physician Assistant | Admitting: Physician Assistant

## 2016-12-05 DIAGNOSIS — E041 Nontoxic single thyroid nodule: Secondary | ICD-10-CM | POA: Diagnosis present

## 2016-12-05 NOTE — Procedures (Signed)
Using direct ultrasound guidance, 5 passes were made using needles into the nodule within the left lobe of the thyroid.   Ultrasound was used to confirm needle placements on all occasions.   Specimens were sent to Pathology for analysis.  Krystyna Cleckley S Shareef Eddinger PA-C 12/05/2016 4:15 PM

## 2017-03-20 ENCOUNTER — Ambulatory Visit (INDEPENDENT_AMBULATORY_CARE_PROVIDER_SITE_OTHER): Payer: Managed Care, Other (non HMO) | Admitting: Neurology

## 2017-03-20 ENCOUNTER — Encounter: Payer: Self-pay | Admitting: Neurology

## 2017-03-20 VITALS — BP 120/80 | HR 74 | Ht 65.0 in | Wt 236.2 lb

## 2017-03-20 DIAGNOSIS — R29898 Other symptoms and signs involving the musculoskeletal system: Secondary | ICD-10-CM

## 2017-03-20 DIAGNOSIS — R202 Paresthesia of skin: Secondary | ICD-10-CM | POA: Diagnosis not present

## 2017-03-20 DIAGNOSIS — G959 Disease of spinal cord, unspecified: Secondary | ICD-10-CM

## 2017-03-20 DIAGNOSIS — R29818 Other symptoms and signs involving the nervous system: Secondary | ICD-10-CM

## 2017-03-20 NOTE — Patient Instructions (Addendum)
1.  MRI brain and cervical wwo contrast 2.  If you have CD images of your previous MRIs, please bring them to the office  We will call you with the results and determine the next step

## 2017-03-20 NOTE — Progress Notes (Signed)
[facility] Clinic Note - Initial Visit   Date: 03/20/17  LERA GAINES MRN: [id number] DOB: 1971-02-08   Dear Dr. Justin Mend:  Thank you for your kind referral of DAGNY FIORENTINO for consultation of bilateral hand numbness. Although her history is well known to you, please allow [name] to reiterate it for the purpose of our medical record. The patient was accompanied to the clinic by self.    History of Present Illness: LAKAYA TOLEN is a 46 y.o. left-handed Caucasian female with seasonal allergies and history of renal reflux during childhood presenting for evaluation of bilateral hand and left leg numbness.    Starting around February 2015, she started having numbness of the hands and electrical shock-like sensation down her back with neck flexion.  She saw Dr. Krista Blue at [facility] for the same complaints to completed extensive work-up including NCS/EMG of the arms, MRI brain, MRI cervical spine, and CSF testing. Her imaging shows T2 hyperintense lesion at C2 level, no abnormalities of the brain.  CSF was essentially normal, there was one oligoclonal banding, not enough evidence to support a diagnosis of multiple sclerosis. NCS/EMG was normal.   She continues to have intermittent spells of bilateral hand and left leg numbness.  Numbness involves mostly the tips of the fingers and left lateral lower leg.  It is worse with repetitive activity, such as walking, exercising, or repetitive hand movements.  She endorses generalized weakness and dropping objects from the left hand.  She denies any low back pain or leg weakness.  There is nothing that improves her symptoms.  No worsening of symptoms with warmer temperatures.   Out-side paper records, electronic medical record, and images have been reviewed where available and summarized as:  MRI cerivcal spine wwo contrast 02/04/2014: Abnormal MRI cervical spine (with and without) demonstrating: 1. The spinal cord is notable  for a T2 hyperintense lesion at C2 level, posterior and centrally. There may be questionable, faint enhancement of this lesion on post contrast views. May represent subacute inflammatory process. Considerations include autoimmune, inflammatory, demyelinating, post-infectious or neoplastic etiologies. 2. Remainder of spinal cord is normal. 3. No spinal stenosis or foraminal narrowing.  MRI brain wwo contrast 02/04/2014: Equivocal MRI brain (with and without) demonstrating: 1. 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 2. Pansinusitis changes with mucosal thickening in the ethmoid, maxillary, sphenoid and frontal sinuses, with air fluid levels. 3. Otherwise normal brain parenchyma. No white matter lesions.  CSF 02/19/2014:  W1 R0 P31 G54, IgG index 0.4, OCB 1 band present  Labs 02/08/2014:  ESR 8, CRP 2.0, AChR antibody negative, SPEP with IFE, vitamin B12 546, TSH 0.681, folate >19.9, HIV, RPR neg, copper 119, ANA neg  Labs 01/08/2017:  HbA1c 5.7 Labs 02/08/2014:  Vitamin B12 546   Past Medical History:  Diagnosis Date  . Numbness in both hands   . Reflux     Past Surgical History:  Procedure Laterality Date  . CESAREAN SECTION    . renal reflux      Medications:  Outpatient Encounter Prescriptions as of 03/20/2017  Medication Sig  . fexofenadine (ALLEGRA) 180 MG tablet Take 180 mg by mouth daily as needed for allergies.    No facility-administered encounter medications on file as of 03/20/2017.      Allergies:  Allergies  Allergen Reactions  . Penicillins Hives  . Sulfa Antibiotics Hives    Family History: Family History  Problem Relation  Age of Onset  . Heart failure Father   . Cancer Father   . Sjogren's syndrome Mother   . Rheum arthritis Mother   . Hyperlipidemia Mother     Social History: Social History  Substance Use Topics  . Smoking status: Never Smoker  . Smokeless tobacco:  Never Used  . Alcohol use 0.6 oz/week    1 Cans of beer per week     Comment: 1-2 times per week.   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   Patient works for [employer].   Left handed   Caffeine 2-3 cups daily.   Lives in a 2 story home.           Review of Systems:  CONSTITUTIONAL: No fevers, chills, night sweats, or weight loss.   EYES: No visual changes or eye pain ENT: No hearing changes.  No history of nose bleeds.   RESPIRATORY: No cough, wheezing and shortness of breath.   CARDIOVASCULAR: Negative for chest pain, and palpitations.   GI: Negative for abdominal discomfort, blood in stools or black stools.  No recent change in bowel habits.   GU:  No history of incontinence.   MUSCLOSKELETAL: No history of joint pain or swelling.  No myalgias.   SKIN: Negative for lesions, rash, and itching.   HEMATOLOGY/ONCOLOGY: Negative for prolonged bleeding, bruising easily, and swollen nodes.  No history of cancer.   ENDOCRINE: Negative for cold or heat intolerance, polydipsia or goiter.   PSYCH:  No depression or anxiety symptoms.   NEURO: As Above.   Vital Signs:  BP 120/80   Pulse 74   Ht '5\' 5"'  (1.651 m)   Wt 236 lb 4 oz (107.2 kg)   SpO2 98%   BMI 39.31 kg/m  Pain Scale: 0 on a scale of 0-10   General Medical Exam:   General:  Well appearing, comfortable.   Eyes/ENT: see cranial nerve examination.   Neck: No masses appreciated.  Full range of motion without tenderness.  No carotid bruits. Respiratory:  Clear to auscultation, good air entry bilaterally.   Cardiac:  Regular rate and rhythm, no murmur.   Extremities:  No deformities, edema, or skin discoloration.  Skin:  No rashes or lesions.  Neurological Exam: MENTAL STATUS including orientation to time, place, person, recent and remote memory, attention span and concentration, language, and fund of knowledge is normal.   Speech is not dysarthric.  CRANIAL NERVES: II:  No visual field defects.  Unremarkable fundi.   III-IV-VI: Pupils equal round and reactive to light.  Normal conjugate, extra-ocular eye movements in all directions of gaze.  No nystagmus.  No ptosis.   V:  Normal facial sensation.  Jaw jerk is absent.   VII:  Normal facial symmetry and movements.  No pathologic facial reflexes.  VIII:  Normal hearing and vestibular function.   IX-X:  Normal palatal movement.   XI:  Normal shoulder shrug and head rotation.   XII:  Normal tongue strength and range of motion, no deviation or fasciculation.  MOTOR:  No atrophy, fasciculations or abnormal movements.  No pronator drift.  Tone is normal.  Lhermitte's sign is positive.   Right Upper Extremity:    Left Upper Extremity:    Deltoid  5/5   Deltoid  5/5   Biceps  5/5   Biceps  5/5   Triceps  5/5   Triceps  5/5   Wrist extensors  5/5   Wrist extensors  5/5   Wrist flexors  5/5   Wrist flexors  5/5   Finger extensors  5/5   Finger extensors  5/5   Finger flexors  5/5   Finger flexors  5/5   Dorsal interossei  5/5   Dorsal interossei  5/5   Abductor pollicis  5/5   Abductor pollicis  5/5   Tone (Ashworth scale)  0  Tone (Ashworth scale)  0   Right Lower Extremity:    Left Lower Extremity:    Hip flexors  5/5   Hip flexors  5/5   Hip extensors  5/5   Hip extensors  5/5   Knee flexors  5/5   Knee flexors  5/5   Knee extensors  5/5   Knee extensors  5/5   Dorsiflexors  5/5   Dorsiflexors  5/5   Plantarflexors  5/5   Plantarflexors  5/5   Toe extensors  5/5   Toe extensors  5/5   Toe flexors  5/5   Toe flexors  5/5   Tone (Ashworth scale)  0  Tone (Ashworth scale)  0   MSRs:  Right                                                                 Left brachioradialis 3+  brachioradialis 3+  biceps 3+  biceps 3+  triceps 2+  triceps 2+  patellar 3++  patellar 3+  ankle jerk 2+  ankle jerk 2+  Hoffman no  Hoffman no  plantar response down  plantar  response down   SENSORY:  Diminished vibration at the left great toe, temperature and light touch is also reduced over the left foot and lateral lower leg.  Otherwise, sensation to all modalities intact in the upper extremities. Romberg's sign absent.  Tinel's positive on the left wrist.   COORDINATION/GAIT: Normal finger-to- nose-finger.  Intact rapid alternating movements bilaterally.  Able to rise from a chair without using arms.  Gait narrow based and stable. Tandem and stressed gait intact.    IMPRESSION: Mrs. Loetta Rough is a 46 year-old female referred for evaluation of bilateral hand paresthesias and left leg paresthesias.  Her previous work-up with Dr. Krista Blue at [facility] has been extensive and showed small T2 hyperintensity involving the cervical cord at C2.  MRI brain, NCS/EMG of the arms, and CSF testing was essentially normal.  Because of her ongoing symptoms, she will undergo repeat MRI brain and cervical spine wwo contrast to assess for any increased disease burden, which may favor demyelinating disease. She may need additional MRI of the thoracic spine and/or NCS/EMG of the left side going forward.   The duration of this appointment visit was 50 minutes of face-to-face time with the patient.  Greater than 50% of this time was spent in counseling, explanation of diagnosis, planning of further management, and coordination of care.   Thank you for allowing me to participate in patient's care.  If I can answer any additional questions, I would be pleased to do so.    Sincerely,    Bland Rudzinski K. Posey Pronto, DO

## 2017-04-08 ENCOUNTER — Other Ambulatory Visit: Payer: Managed Care, Other (non HMO)

## 2017-04-15 ENCOUNTER — Ambulatory Visit
Admission: RE | Admit: 2017-04-15 | Discharge: 2017-04-15 | Disposition: A | Discharge status: Home / Self Care | Payer: Managed Care, Other (non HMO) | Source: Ambulatory Visit | Attending: Neurology | Admitting: Neurology

## 2017-04-15 DIAGNOSIS — G959 Disease of spinal cord, unspecified: Secondary | ICD-10-CM

## 2017-04-15 DIAGNOSIS — R29898 Other symptoms and signs involving the musculoskeletal system: Secondary | ICD-10-CM

## 2017-04-15 DIAGNOSIS — R202 Paresthesia of skin: Secondary | ICD-10-CM

## 2017-04-15 DIAGNOSIS — R29818 Other symptoms and signs involving the nervous system: Secondary | ICD-10-CM

## 2017-04-15 MED ORDER — GADOBENATE DIMEGLUMINE 529 MG/ML IV SOLN
20.0000 mL | Freq: Once | INTRAVENOUS | Status: AC | PRN
  Administered 2017-04-15: 20 mL via INTRAVENOUS

## 2017-04-16 ENCOUNTER — Telehealth: Payer: Self-pay | Admitting: Neurology

## 2017-04-16 NOTE — Telephone Encounter (Signed)
Called and discussed the results of patient's MRI brain and cervical spine. MRI brain is normal. MRI cervical spine continues to show T2 hyperintensity at C2, which has been previously seen since 2015 and stable. There are no new lesions.  I offered to do further workup with MRI thoracic spine to look for more spinal cord involvement, however patient reports symptoms are stable and she does not wish to pursue additional testing at this time. She will contact the office if she has any new or worsening neurological symptoms.  Grenda Lora K. Allena KatzPatel, DO

## 2017-06-07 ENCOUNTER — Other Ambulatory Visit: Payer: Self-pay | Admitting: Obstetrics and Gynecology

## 2017-06-07 DIAGNOSIS — Z1231 Encounter for screening mammogram for malignant neoplasm of breast: Secondary | ICD-10-CM

## 2017-06-14 ENCOUNTER — Ambulatory Visit
Admission: RE | Admit: 2017-06-14 | Discharge: 2017-06-14 | Disposition: A | Discharge status: Home / Self Care | Payer: Managed Care, Other (non HMO) | Source: Ambulatory Visit | Attending: Obstetrics and Gynecology | Admitting: Obstetrics and Gynecology

## 2017-06-14 DIAGNOSIS — Z1231 Encounter for screening mammogram for malignant neoplasm of breast: Secondary | ICD-10-CM

## 2017-12-05 DIAGNOSIS — Z719 Counseling, unspecified: Secondary | ICD-10-CM | POA: Diagnosis not present

## 2017-12-18 DIAGNOSIS — Z719 Counseling, unspecified: Secondary | ICD-10-CM | POA: Diagnosis not present

## 2017-12-25 DIAGNOSIS — Z719 Counseling, unspecified: Secondary | ICD-10-CM | POA: Diagnosis not present

## 2018-01-01 DIAGNOSIS — Z719 Counseling, unspecified: Secondary | ICD-10-CM | POA: Diagnosis not present

## 2018-01-08 DIAGNOSIS — Z719 Counseling, unspecified: Secondary | ICD-10-CM | POA: Diagnosis not present

## 2018-01-14 ENCOUNTER — Other Ambulatory Visit: Payer: Self-pay | Admitting: Family Medicine

## 2018-01-14 DIAGNOSIS — R7303 Prediabetes: Secondary | ICD-10-CM | POA: Diagnosis not present

## 2018-01-14 DIAGNOSIS — E041 Nontoxic single thyroid nodule: Secondary | ICD-10-CM | POA: Diagnosis not present

## 2018-01-14 DIAGNOSIS — Z0000 Encounter for general adult medical examination without abnormal findings: Secondary | ICD-10-CM | POA: Diagnosis not present

## 2018-01-20 ENCOUNTER — Ambulatory Visit
Admission: RE | Admit: 2018-01-20 | Discharge: 2018-01-20 | Disposition: A | Discharge status: Home / Self Care | Payer: 59 | Source: Ambulatory Visit | Attending: Family Medicine | Admitting: Family Medicine

## 2018-01-20 DIAGNOSIS — E049 Nontoxic goiter, unspecified: Secondary | ICD-10-CM | POA: Diagnosis not present

## 2018-01-20 DIAGNOSIS — E041 Nontoxic single thyroid nodule: Secondary | ICD-10-CM

## 2018-01-22 DIAGNOSIS — Z719 Counseling, unspecified: Secondary | ICD-10-CM | POA: Diagnosis not present

## 2018-01-29 DIAGNOSIS — Z719 Counseling, unspecified: Secondary | ICD-10-CM | POA: Diagnosis not present

## 2018-02-05 DIAGNOSIS — Z719 Counseling, unspecified: Secondary | ICD-10-CM | POA: Diagnosis not present

## 2018-02-12 DIAGNOSIS — Z719 Counseling, unspecified: Secondary | ICD-10-CM | POA: Diagnosis not present

## 2018-02-19 DIAGNOSIS — Z719 Counseling, unspecified: Secondary | ICD-10-CM | POA: Diagnosis not present

## 2018-02-26 DIAGNOSIS — Z719 Counseling, unspecified: Secondary | ICD-10-CM | POA: Diagnosis not present

## 2018-03-11 DIAGNOSIS — Z719 Counseling, unspecified: Secondary | ICD-10-CM | POA: Diagnosis not present

## 2018-03-13 DIAGNOSIS — Z719 Counseling, unspecified: Secondary | ICD-10-CM | POA: Diagnosis not present

## 2018-05-12 ENCOUNTER — Other Ambulatory Visit: Payer: Self-pay | Admitting: Obstetrics and Gynecology

## 2018-05-12 DIAGNOSIS — Z1231 Encounter for screening mammogram for malignant neoplasm of breast: Secondary | ICD-10-CM

## 2018-06-16 ENCOUNTER — Ambulatory Visit: Payer: 59

## 2018-06-30 ENCOUNTER — Ambulatory Visit
Admission: RE | Admit: 2018-06-30 | Discharge: 2018-06-30 | Disposition: A | Discharge status: Home / Self Care | Payer: 59 | Source: Ambulatory Visit | Attending: Obstetrics and Gynecology | Admitting: Obstetrics and Gynecology

## 2018-06-30 DIAGNOSIS — Z1231 Encounter for screening mammogram for malignant neoplasm of breast: Secondary | ICD-10-CM

## 2018-07-26 DIAGNOSIS — H01001 Unspecified blepharitis right upper eyelid: Secondary | ICD-10-CM | POA: Diagnosis not present

## 2018-07-29 DIAGNOSIS — H00021 Hordeolum internum right upper eyelid: Secondary | ICD-10-CM | POA: Diagnosis not present

## 2018-08-05 DIAGNOSIS — H0011 Chalazion right upper eyelid: Secondary | ICD-10-CM | POA: Diagnosis not present

## 2018-10-28 DIAGNOSIS — Z6836 Body mass index (BMI) 36.0-36.9, adult: Secondary | ICD-10-CM | POA: Diagnosis not present

## 2018-10-28 DIAGNOSIS — Z01419 Encounter for gynecological examination (general) (routine) without abnormal findings: Secondary | ICD-10-CM | POA: Diagnosis not present

## 2018-10-28 DIAGNOSIS — Z130 Encounter for screening for diseases of the blood and blood-forming organs and certain disorders involving the immune mechanism: Secondary | ICD-10-CM | POA: Diagnosis not present

## 2019-01-29 ENCOUNTER — Other Ambulatory Visit: Payer: Self-pay | Admitting: Family Medicine

## 2019-01-29 DIAGNOSIS — E041 Nontoxic single thyroid nodule: Secondary | ICD-10-CM

## 2019-07-07 ENCOUNTER — Other Ambulatory Visit: Payer: Self-pay | Admitting: Obstetrics and Gynecology

## 2019-07-07 DIAGNOSIS — Z1231 Encounter for screening mammogram for malignant neoplasm of breast: Secondary | ICD-10-CM

## 2019-07-08 ENCOUNTER — Ambulatory Visit
Admission: RE | Admit: 2019-07-08 | Discharge: 2019-07-08 | Disposition: A | Discharge status: Home / Self Care | Payer: 59 | Source: Ambulatory Visit

## 2019-07-08 ENCOUNTER — Other Ambulatory Visit: Payer: Self-pay

## 2019-07-08 DIAGNOSIS — Z1231 Encounter for screening mammogram for malignant neoplasm of breast: Secondary | ICD-10-CM

## 2020-04-08 ENCOUNTER — Other Ambulatory Visit: Payer: Self-pay | Admitting: Family Medicine

## 2020-04-08 DIAGNOSIS — I10 Essential (primary) hypertension: Secondary | ICD-10-CM

## 2020-04-08 DIAGNOSIS — R609 Edema, unspecified: Secondary | ICD-10-CM

## 2020-04-08 DIAGNOSIS — E041 Nontoxic single thyroid nodule: Secondary | ICD-10-CM

## 2020-04-18 ENCOUNTER — Ambulatory Visit
Admission: RE | Admit: 2020-04-18 | Discharge: 2020-04-18 | Disposition: A | Discharge status: Home / Self Care | Payer: 59 | Source: Ambulatory Visit | Attending: Family Medicine | Admitting: Family Medicine

## 2020-04-18 DIAGNOSIS — E041 Nontoxic single thyroid nodule: Secondary | ICD-10-CM

## 2020-04-18 DIAGNOSIS — I10 Essential (primary) hypertension: Secondary | ICD-10-CM

## 2020-04-18 DIAGNOSIS — R609 Edema, unspecified: Secondary | ICD-10-CM

## 2020-07-07 ENCOUNTER — Other Ambulatory Visit: Payer: Self-pay | Admitting: Obstetrics and Gynecology

## 2020-07-07 DIAGNOSIS — Z1231 Encounter for screening mammogram for malignant neoplasm of breast: Secondary | ICD-10-CM

## 2020-07-19 ENCOUNTER — Other Ambulatory Visit: Payer: Self-pay

## 2020-07-19 ENCOUNTER — Ambulatory Visit (INDEPENDENT_AMBULATORY_CARE_PROVIDER_SITE_OTHER): Payer: 59 | Admitting: Psychology

## 2020-07-19 ENCOUNTER — Ambulatory Visit
Admission: RE | Admit: 2020-07-19 | Discharge: 2020-07-19 | Disposition: A | Discharge status: Home / Self Care | Payer: 59 | Source: Ambulatory Visit | Attending: Obstetrics and Gynecology | Admitting: Obstetrics and Gynecology

## 2020-07-19 DIAGNOSIS — Z1231 Encounter for screening mammogram for malignant neoplasm of breast: Secondary | ICD-10-CM

## 2020-07-19 DIAGNOSIS — F4321 Adjustment disorder with depressed mood: Secondary | ICD-10-CM

## 2020-08-02 ENCOUNTER — Ambulatory Visit (INDEPENDENT_AMBULATORY_CARE_PROVIDER_SITE_OTHER): Payer: 59 | Admitting: Psychology

## 2020-08-02 DIAGNOSIS — F4321 Adjustment disorder with depressed mood: Secondary | ICD-10-CM | POA: Diagnosis not present

## 2020-08-12 ENCOUNTER — Other Ambulatory Visit: Payer: 59

## 2020-08-12 ENCOUNTER — Other Ambulatory Visit: Payer: Self-pay

## 2020-08-12 DIAGNOSIS — Z20822 Contact with and (suspected) exposure to covid-19: Secondary | ICD-10-CM

## 2020-08-15 LAB — NOVEL CORONAVIRUS, NAA: SARS-CoV-2, NAA: NOT DETECTED

## 2020-08-16 ENCOUNTER — Ambulatory Visit (INDEPENDENT_AMBULATORY_CARE_PROVIDER_SITE_OTHER): Payer: 59 | Admitting: Psychology

## 2020-08-16 DIAGNOSIS — F4321 Adjustment disorder with depressed mood: Secondary | ICD-10-CM

## 2020-08-30 ENCOUNTER — Ambulatory Visit (INDEPENDENT_AMBULATORY_CARE_PROVIDER_SITE_OTHER): Payer: 59 | Admitting: Psychology

## 2020-08-30 DIAGNOSIS — Z9109 Other allergy status, other than to drugs and biological substances: Secondary | ICD-10-CM | POA: Insufficient documentation

## 2020-08-30 DIAGNOSIS — M19042 Primary osteoarthritis, left hand: Secondary | ICD-10-CM | POA: Insufficient documentation

## 2020-08-30 DIAGNOSIS — F4321 Adjustment disorder with depressed mood: Secondary | ICD-10-CM | POA: Diagnosis not present

## 2020-08-30 DIAGNOSIS — N1831 Chronic kidney disease, stage 3a: Secondary | ICD-10-CM | POA: Insufficient documentation

## 2020-08-30 DIAGNOSIS — M19041 Primary osteoarthritis, right hand: Secondary | ICD-10-CM | POA: Insufficient documentation

## 2020-09-13 ENCOUNTER — Ambulatory Visit (INDEPENDENT_AMBULATORY_CARE_PROVIDER_SITE_OTHER): Payer: 59 | Admitting: Psychology

## 2020-09-13 DIAGNOSIS — F4321 Adjustment disorder with depressed mood: Secondary | ICD-10-CM | POA: Diagnosis not present

## 2020-10-03 ENCOUNTER — Ambulatory Visit (INDEPENDENT_AMBULATORY_CARE_PROVIDER_SITE_OTHER): Payer: 59 | Admitting: Psychology

## 2020-10-03 DIAGNOSIS — F4321 Adjustment disorder with depressed mood: Secondary | ICD-10-CM | POA: Diagnosis not present

## 2020-10-11 ENCOUNTER — Encounter (INDEPENDENT_AMBULATORY_CARE_PROVIDER_SITE_OTHER): Payer: Self-pay | Admitting: Bariatrics

## 2020-10-12 ENCOUNTER — Ambulatory Visit (INDEPENDENT_AMBULATORY_CARE_PROVIDER_SITE_OTHER): Payer: 59 | Admitting: Bariatrics

## 2020-10-12 ENCOUNTER — Encounter (INDEPENDENT_AMBULATORY_CARE_PROVIDER_SITE_OTHER): Payer: Self-pay | Admitting: Bariatrics

## 2020-10-12 ENCOUNTER — Other Ambulatory Visit: Payer: Self-pay

## 2020-10-12 VITALS — BP 126/85 | HR 71 | Temp 98.0°F | Ht 65.0 in | Wt 239.0 lb

## 2020-10-12 DIAGNOSIS — R0602 Shortness of breath: Secondary | ICD-10-CM | POA: Diagnosis not present

## 2020-10-12 DIAGNOSIS — N183 Chronic kidney disease, stage 3 unspecified: Secondary | ICD-10-CM

## 2020-10-12 DIAGNOSIS — R7303 Prediabetes: Secondary | ICD-10-CM | POA: Diagnosis not present

## 2020-10-12 DIAGNOSIS — R5383 Other fatigue: Secondary | ICD-10-CM | POA: Diagnosis not present

## 2020-10-12 DIAGNOSIS — R600 Localized edema: Secondary | ICD-10-CM

## 2020-10-12 DIAGNOSIS — Z0289 Encounter for other administrative examinations: Secondary | ICD-10-CM

## 2020-10-12 DIAGNOSIS — Z1331 Encounter for screening for depression: Secondary | ICD-10-CM

## 2020-10-12 DIAGNOSIS — Z9189 Other specified personal risk factors, not elsewhere classified: Secondary | ICD-10-CM | POA: Diagnosis not present

## 2020-10-12 DIAGNOSIS — M25569 Pain in unspecified knee: Secondary | ICD-10-CM

## 2020-10-12 DIAGNOSIS — Z6839 Body mass index (BMI) 39.0-39.9, adult: Secondary | ICD-10-CM

## 2020-10-12 DIAGNOSIS — E559 Vitamin D deficiency, unspecified: Secondary | ICD-10-CM | POA: Diagnosis not present

## 2020-10-12 DIAGNOSIS — D649 Anemia, unspecified: Secondary | ICD-10-CM

## 2020-10-12 NOTE — Progress Notes (Signed)
Dear Dr. Shirlean Mylar,   Thank you for referring Kendra Day to our clinic. The following note includes my evaluation and treatment recommendations.  Chief Complaint:   OBESITY Kendra Day (MR# [id number]) is a 49 y.o. female who presents for evaluation and treatment of obesity and related comorbidities. Current BMI is Body mass index is 39.77 kg/m.Kendra Day has been struggling with her weight for many years and has been unsuccessful in either losing weight, maintaining weight loss, or reaching her healthy weight goal.  Kendra Day is currently in the action stage of change and ready to dedicate time achieving and maintaining a healthier weight. Kendra Day is interested in becoming our patient and working on intensive lifestyle modifications including (but not limited to) diet and exercise for weight loss.  Kendra Day states that she hates to [employer] and notes that her kitchen needs to be renovated. She craves sweets. She states that she is a picky eater.  Kendra Day habits were reviewed today and are as follows: Her family sometimes eats meals together, her desired weight loss is 59 lbs, she has been heavy most of her life, she started gaining weight in 2012, her heaviest weight ever was 239 pounds, she is a picky eater and doesn't like to eat healthier foods, she craves sweets, she frequently makes poor food choices, she sometimes eats larger portions than normal, she has binge eating behaviors and she struggles with emotional eating.  Depression Screen Kendra Day's Food and Mood (modified PHQ-9) score was 9.  Depression screen PHQ 2/9 10/12/2020  Decreased Interest 1  Down, Depressed, Hopeless 1  PHQ - 2 Score 2  Altered sleeping 1  Tired, decreased energy 1  Change in appetite 2  Feeling bad or failure about yourself  2  Trouble concentrating 1  Moving slowly or fidgety/restless 0  Suicidal thoughts 0  PHQ-9 Score 9  Difficult doing work/chores Not difficult at all    Subjective:   Other fatigue. [name] denies daytime somnolence and admits to waking up still tired. Kendra Day generally gets 7 hours of sleep per night, and states that she has generally restful sleep. Snoring is present. Apneic episodes are not present. Epworth Sleepiness Score is 2.  SOB (shortness of breath) on exertion. Kendra Day notes increasing shortness of breath with exercising and seems to be worsening over time with weight gain. She notes getting out of breath sooner with activity than she used to. This has not gotten worse recently. Kendra Day denies shortness of breath at rest or orthopnea.  Knee pain, unspecified chronicity, unspecified laterality. Kendra Day is on no medication. She reports occasional restriction with activities; is better with motion.  Stage 3 chronic kidney disease, unspecified whether stage 3a or 3b CKD (HCC). Kendra Day reports no adjustments at this time.  Anemia, unspecified type. Kendra Day is taking iron over-the-counter.   CBC Latest Ref Rng & Units 02/21/2014  WBC 4.0 - 10.5 K/uL 7.6  Hemoglobin 12.0 - 15.0 g/dL 84.6  Hematocrit 36 - 46 % 41.6  Platelets 150 - 400 K/uL 218   No results found for: IRON, TIBC, FERRITIN Lab Results  Component Value Date   VITAMINB12 546 02/08/2014   Lower extremity edema. Left lower extremity edema is greater than right. [name] is not on diuretics.  Prediabetes. Kendra Day has a diagnosis of prediabetes based on her elevated HgA1c and was informed this puts her at greater risk of developing diabetes. She continues to work on diet and exercise to decrease her risk of diabetes. She denies nausea or  hypoglycemia. Kendra Day is on no medication.  No results found for: HGBA1C No results found for: INSULIN  Vitamin D deficiency. No nausea, vomiting, or muscle weakness.   Depression screening. Kendra Day had a mildly positive depression screening with a PHQ-9 score of 9.  At risk for activity intolerance. Kendra Day is at risk of  exercise intolerance due to knee pain.  Assessment/Plan:   Other fatigue. Kendra Day does feel that her weight is causing her energy to be lower than it should be. Fatigue may be related to obesity, depression or many other causes. Labs will be ordered, and in the meanwhile, Kendra Day will focus on self care including making healthy food choices, increasing physical activity and focusing on stress reduction. EKG 12-Lead, Insulin, random, VITAMIN D 25 Hydroxy (Vit-D Deficiency, Fractures) testing ordered today.  SOB (shortness of breath) on exertion. Kendra Day does not feel that she gets out of breath more easily that she used to when she exercises. Kendra Day shortness of breath appears to be obesity related and exercise induced. She has agreed to work on weight loss and gradually increase exercise to treat her exercise induced shortness of breath. Will continue to monitor closely.  Knee pain, unspecified chronicity, unspecified laterality. Kendra Day was instructed to avoid pounding exercises.  Stage 3 chronic kidney disease, unspecified whether stage 3a or 3b CKD (HCC). Kendra Day will continue to see Nephrology as scheduled.  Anemia, unspecified type. Kendra Day will continue iron as directed.   Lower extremity edema. PCP will follow.  Prediabetes. Kendra Day will continue to work on weight loss, exercise, increasing healthy fats and protein, and decreasing simple carbohydrates to help decrease the risk of diabetes.  Insulin, random level will be checked today.  Vitamin D deficiency. Low Vitamin D level contributes to fatigue and are associated with obesity, breast, and colon cancer. Vitamin D level will be checked today.  Depression screening. Kendra Day had a positive depression screening. Depression is commonly associated with obesity and often results in emotional eating behaviors. We will monitor this closely and work on CBT to help improve the non-hunger eating patterns. Referral to Psychology may be  required if no improvement is seen as she continues in our clinic.  At risk for activity intolerance. Kendra Day was given approximately 15 minutes of exercise intolerance counseling today. She is 49 y.o. female and has risk factors exercise intolerance including obesity. We discussed intensive lifestyle modifications today with an emphasis on specific weight loss instructions and strategies. Ane will slowly increase activity as tolerated.  Repetitive spaced learning was employed today to elicit superior memory formation and behavioral change.  Class 2 severe obesity with serious comorbidity and body mass index (BMI) of 39.0 to 39.9 in adult, unspecified obesity type (HCC).  Donelda is currently in the action stage of change and her goal is to continue with weight loss efforts. I recommend Bethaney begin the structured treatment plan as follows:  She has agreed to the Category 1 Plan.  She will work on meal planning, mindful eating, and will decrease unhealthy snacking.  We reviewed with the patient labs including lipids, A1c, CBC, CMP, and TSH.  Exercise goals: Consuella will continue to walk for exercise.   Behavioral modification strategies: increasing lean protein intake, decreasing simple carbohydrates, increasing vegetables, increasing water intake, decreasing eating out, no skipping meals, meal planning and cooking strategies, keeping healthy foods in the home and planning for success.  She was informed of the importance of frequent follow-up visits to maximize her success with intensive lifestyle modifications for her multiple  health conditions. She was informed we would discuss her lab results at her next visit unless there is a critical issue that needs to be addressed sooner. Saesha agreed to keep her next visit at the agreed upon time to discuss these results.  Objective:   Blood pressure 126/85, pulse 71, temperature 98 F (36.7 C), temperature source Oral, height 5\' 5"   (1.651 m), weight 239 lb (108.4 kg), last menstrual period 10/01/2020, SpO2 99 %. Body mass index is 39.77 kg/m.  EKG: Sinus  Rhythm with a rate of 74 BPM. Poor R wave progression. Low voltage. Otherwise normal.  Indirect Calorimeter completed today shows a VO2 of 205 and a REE of 1414.  Her calculated basal metabolic rate is 10/03/2020 thus her basal metabolic rate is worse than expected.  General: Cooperative, alert, well developed, in no acute distress. HEENT: Conjunctivae and lids unremarkable. Cardiovascular: Regular rhythm.  Lungs: Normal work of breathing. Neurologic: No focal deficits.   Lab Results  Component Value Date   CREATININE 1.02 02/21/2014   BUN 20 02/21/2014   NA 141 02/21/2014   K 4.1 02/21/2014   CL 103 02/21/2014   CO2 25 02/21/2014   No results found for: ALT, AST, GGT, ALKPHOS, BILITOT No results found for: HGBA1C No results found for: INSULIN Lab Results  Component Value Date   TSH 0.681 02/08/2014   No results found for: CHOL, HDL, LDLCALC, LDLDIRECT, TRIG, CHOLHDL Lab Results  Component Value Date   WBC 7.6 02/21/2014   HGB 14.6 02/21/2014   HCT 41.6 02/21/2014   MCV 85.2 02/21/2014   PLT 218 02/21/2014   No results found for: IRON, TIBC, FERRITIN  Attestation Statements:   Reviewed by clinician on day of visit: allergies, medications, problem list, medical history, surgical history, family history, social history, and previous encounter notes.  02/23/2014, am acting as Fernanda Drum for Energy manager, DO   I have reviewed the above documentation for accuracy and completeness, and I agree with the above. [employer], DO

## 2020-10-13 ENCOUNTER — Encounter (INDEPENDENT_AMBULATORY_CARE_PROVIDER_SITE_OTHER): Payer: Self-pay | Admitting: Bariatrics

## 2020-10-13 DIAGNOSIS — E6609 Other obesity due to excess calories: Secondary | ICD-10-CM | POA: Insufficient documentation

## 2020-10-13 DIAGNOSIS — Z6839 Body mass index (BMI) 39.0-39.9, adult: Secondary | ICD-10-CM | POA: Insufficient documentation

## 2020-10-13 LAB — VITAMIN D 25 HYDROXY (VIT D DEFICIENCY, FRACTURES): Vit D, 25-Hydroxy: 45.9 ng/mL (ref 30.0–100.0)

## 2020-10-13 LAB — INSULIN, RANDOM: INSULIN: 10.8 u[IU]/mL (ref 2.6–24.9)

## 2020-10-20 ENCOUNTER — Ambulatory Visit (INDEPENDENT_AMBULATORY_CARE_PROVIDER_SITE_OTHER): Payer: 59 | Admitting: Psychology

## 2020-10-20 DIAGNOSIS — F4321 Adjustment disorder with depressed mood: Secondary | ICD-10-CM

## 2020-10-26 ENCOUNTER — Ambulatory Visit (INDEPENDENT_AMBULATORY_CARE_PROVIDER_SITE_OTHER): Payer: 59 | Admitting: Bariatrics

## 2020-10-26 ENCOUNTER — Encounter (INDEPENDENT_AMBULATORY_CARE_PROVIDER_SITE_OTHER): Payer: Self-pay | Admitting: Bariatrics

## 2020-10-26 ENCOUNTER — Other Ambulatory Visit: Payer: Self-pay

## 2020-10-26 VITALS — BP 113/77 | HR 60 | Ht 65.0 in | Wt 233.0 lb

## 2020-10-26 DIAGNOSIS — Z6838 Body mass index (BMI) 38.0-38.9, adult: Secondary | ICD-10-CM | POA: Diagnosis not present

## 2020-10-26 DIAGNOSIS — E559 Vitamin D deficiency, unspecified: Secondary | ICD-10-CM | POA: Diagnosis not present

## 2020-10-26 DIAGNOSIS — E8881 Metabolic syndrome: Secondary | ICD-10-CM | POA: Diagnosis not present

## 2020-10-31 ENCOUNTER — Encounter (INDEPENDENT_AMBULATORY_CARE_PROVIDER_SITE_OTHER): Payer: Self-pay | Admitting: Bariatrics

## 2020-10-31 NOTE — Progress Notes (Signed)
Chief Complaint:   OBESITY Kendra Day is here to discuss her progress with her obesity treatment plan along with follow-up of her obesity related diagnoses. Kendra Day is on the Category 1 Plan and states she is following her eating plan approximately 95% of the time. Kendra Day states she is walking 20-40 minutes 2-3 times per week.  Today's visit was #: 2 Starting weight: 239 lbs Starting date: 10/12/2020 Today's weight: 233 lbs Today's date: 10/26/2020 Total lbs lost to date: 6 Total lbs lost since last in-office visit: 6  Interim History: Kendra Day is down 6 lbs since her first visit. She states it was difficult to stay on track (struggling with dinner).  Subjective:   Insulin resistance. Kendra Day has a diagnosis of insulin resistance based on her elevated fasting insulin level >5. She continues to work on diet and exercise to decrease her risk of diabetes. Appetite is normal.  Lab Results  Component Value Date   INSULIN 10.8 10/12/2020   No results found for: HGBA1C  Vitamin D deficiency. Kendra Day is not on Vitamin D supplementation.    Ref. Range 10/12/2020 09:03  Vitamin D, 25-Hydroxy Latest Ref Range: 30.0 - 100.0 ng/mL 45.9   Assessment/Plan:   Insulin resistance. Kendra Day will continue to work on weight loss, exercise, and decreasing refined carbohydrates and sugar to help decrease the risk of diabetes. Kendra Day agreed to follow-up with [name] as directed to closely monitor her progress.  Vitamin D deficiency. Low Vitamin D level contributes to fatigue and are associated with obesity, breast, and colon cancer. She will begin Vitamin D OTC 2,000 IU daily and will follow-up for routine testing of Vitamin D, at least 2-3 times per year to avoid over-replacement.  Class 2 severe obesity with serious comorbidity and body mass index (BMI) of 38.0 to 38.9 in adult, unspecified obesity type (HCC).  Kendra Day is currently in the action stage of change. As such, her goal  is to continue with weight loss efforts. She has agreed to the Category 1 Plan and will journal 300-400 calories and 35+ grams of protein at supper.   We reviewed with the patient labs from 10/12/2020 including Vitamin D and insulin.  She will work on meal planning.  Handouts were provided on Holiday Ideas, Protein Substitutions, and Eating Out.  Exercise goals: All adults should avoid inactivity. Some physical activity is better than none, and adults who participate in any amount of physical activity gain some health benefits.  Behavioral modification strategies: increasing lean protein intake, decreasing simple carbohydrates, increasing vegetables, increasing water intake, decreasing eating out, no skipping meals, meal planning and cooking strategies, keeping healthy foods in the home and planning for success.  Kendra Day has agreed to follow-up with our clinic in 2 weeks. She was informed of the importance of frequent follow-up visits to maximize her success with intensive lifestyle modifications for her multiple health conditions.   Objective:   Blood pressure 113/77, pulse 60, height 5\' 5"  (1.651 m), weight 233 lb (105.7 kg), last menstrual period 10/01/2020, SpO2 98 %. Body mass index is 38.77 kg/m.  General: Cooperative, alert, well developed, in no acute distress. HEENT: Conjunctivae and lids unremarkable. Cardiovascular: Regular rhythm.  Lungs: Normal work of breathing. Neurologic: No focal deficits.   Lab Results  Component Value Date   CREATININE 1.02 02/21/2014   BUN 20 02/21/2014   NA 141 02/21/2014   K 4.1 02/21/2014   CL 103 02/21/2014   CO2 25 02/21/2014   No results found for:  ALT, AST, GGT, ALKPHOS, BILITOT No results found for: HGBA1C Lab Results  Component Value Date   INSULIN 10.8 10/12/2020   Lab Results  Component Value Date   TSH 0.681 02/08/2014   No results found for: CHOL, HDL, LDLCALC, LDLDIRECT, TRIG, CHOLHDL Lab Results  Component Value Date    WBC 7.6 02/21/2014   HGB 14.6 02/21/2014   HCT 41.6 02/21/2014   MCV 85.2 02/21/2014   PLT 218 02/21/2014   No results found for: IRON, TIBC, FERRITIN  Attestation Statements:   Reviewed by clinician on day of visit: allergies, medications, problem list, medical history, surgical history, family history, social history, and previous encounter notes.  Fernanda Drum, am acting as Energy manager for [employer], DO   I have reviewed the above documentation for accuracy and completeness, and I agree with the above. Corinna Capra, DO

## 2020-11-14 ENCOUNTER — Ambulatory Visit (INDEPENDENT_AMBULATORY_CARE_PROVIDER_SITE_OTHER): Payer: 59 | Admitting: Psychology

## 2020-11-14 DIAGNOSIS — F4321 Adjustment disorder with depressed mood: Secondary | ICD-10-CM

## 2020-11-15 ENCOUNTER — Encounter (INDEPENDENT_AMBULATORY_CARE_PROVIDER_SITE_OTHER): Payer: Self-pay | Admitting: Bariatrics

## 2020-11-15 ENCOUNTER — Other Ambulatory Visit: Payer: Self-pay

## 2020-11-15 ENCOUNTER — Ambulatory Visit (INDEPENDENT_AMBULATORY_CARE_PROVIDER_SITE_OTHER): Payer: 59 | Admitting: Bariatrics

## 2020-11-15 VITALS — BP 131/77 | HR 60 | Temp 98.7°F | Ht 65.0 in | Wt 227.0 lb

## 2020-11-15 DIAGNOSIS — E559 Vitamin D deficiency, unspecified: Secondary | ICD-10-CM | POA: Diagnosis not present

## 2020-11-15 DIAGNOSIS — Z6837 Body mass index (BMI) 37.0-37.9, adult: Secondary | ICD-10-CM | POA: Diagnosis not present

## 2020-11-15 DIAGNOSIS — E8881 Metabolic syndrome: Secondary | ICD-10-CM | POA: Diagnosis not present

## 2020-11-15 DIAGNOSIS — D649 Anemia, unspecified: Secondary | ICD-10-CM | POA: Insufficient documentation

## 2020-11-15 NOTE — Progress Notes (Signed)
Chief Complaint:   OBESITY Kendra Day is here to discuss her progress with her obesity treatment plan along with follow-up of her obesity related diagnoses. Kendra Day is on the Category 1 Plan and states she is following her eating plan approximately 50% of the time. Kendra Day states she is walking 20 minutes 4 times per week.  Today's visit was #: 3 Starting weight: 239 lbs Starting date: 10/12/2020 Today's weight: 227 lbs Today's date: 11/15/2020 Total lbs lost to date: 12 Total lbs lost since last in-office visit: 6  Interim History: Kendra Day is down an additional 6 lbs and has done well overall. She has had shingles over the last week. She reports doing okay with her water intake.  Subjective:   Insulin resistance. Kendra Day has a diagnosis of insulin resistance based on her elevated fasting insulin level >5. She continues to work on diet and exercise to decrease her risk of diabetes. Kendra Day is on no medication.  Lab Results  Component Value Date   INSULIN 10.8 10/12/2020   No results found for: HGBA1C  Vitamin D deficiency. Loveta is taking OTC Vitamin D supplementation.    Ref. Range 10/12/2020 09:03  Vitamin D, 25-Hydroxy Latest Ref Range: 30.0 - 100.0 ng/mL 45.9   Assessment/Plan:   Insulin resistance. Kendra Day will continue to work on weight loss, exercise, and decreasing simple carbohydrates to help decrease the risk of diabetes. Kendra Day agreed to follow-up with Kendra Day as directed to closely monitor her progress.  Vitamin D deficiency. Low Vitamin D level contributes to fatigue and are associated with obesity, breast, and colon cancer. She agrees to continue to take Vitamin D as directed and will follow-up for routine testing of Vitamin D, at least 2-3 times per year to avoid over-replacement.  Class 2 severe obesity with serious comorbidity and body mass index (BMI) of 37.0 to 37.9 in adult, unspecified obesity type (HCC).  [name] is currently in the  action stage of change. As such, her goal is to continue with weight loss efforts. She has agreed to the Category 1 Plan.   She will work on meal planning, intentional eating, and continue to track on "My Fitness Pal."  Exercise goals: Kendra Day will continue walking and will be going to the gym for exercise.  Behavioral modification strategies: increasing lean protein intake, decreasing simple carbohydrates, increasing vegetables, increasing water intake, decreasing eating out, no skipping meals, meal planning and cooking strategies, keeping healthy foods in the home, dealing with family or coworker sabotage, travel eating strategies, holiday eating strategies  and celebration eating strategies.  Kendra Day has agreed to follow-up with our clinic in 3 weeks. She was informed of the importance of frequent follow-up visits to maximize her success with intensive lifestyle modifications for her multiple health conditions.   Objective:   Blood pressure 131/77, pulse 60, temperature 98.7 F (37.1 C), height 5\' 5"  (1.651 m), weight 227 lb (103 kg), last menstrual period 10/18/2020, SpO2 97 %. Body mass index is 37.77 kg/m.  General: Cooperative, alert, well developed, in no acute distress. HEENT: Conjunctivae and lids unremarkable. Cardiovascular: Regular rhythm.  Lungs: Normal work of breathing. Neurologic: No focal deficits.   Lab Results  Component Value Date   CREATININE 1.02 02/21/2014   BUN 20 02/21/2014   NA 141 02/21/2014   K 4.1 02/21/2014   CL 103 02/21/2014   CO2 25 02/21/2014   No results found for: ALT, AST, GGT, ALKPHOS, BILITOT No results found for: HGBA1C Lab Results  Component Value  Date   INSULIN 10.8 10/12/2020   Lab Results  Component Value Date   TSH 0.681 02/08/2014   No results found for: CHOL, HDL, LDLCALC, LDLDIRECT, TRIG, CHOLHDL Lab Results  Component Value Date   WBC 7.6 02/21/2014   HGB 14.6 02/21/2014   HCT 41.6 02/21/2014   MCV 85.2 02/21/2014    PLT 218 02/21/2014   No results found for: IRON, TIBC, FERRITIN  Attestation Statements:   Reviewed by clinician on day of visit: allergies, medications, problem list, medical history, surgical history, family history, social history, and previous encounter notes.  Time spent on visit including pre-visit chart review and post-visit charting and care was 20 minutes.   Fernanda Drum, am acting as Energy manager for [employer], DO   I have reviewed the above documentation for accuracy and completeness, and I agree with the above. Corinna Capra, DO

## 2020-11-16 ENCOUNTER — Encounter (INDEPENDENT_AMBULATORY_CARE_PROVIDER_SITE_OTHER): Payer: Self-pay | Admitting: Bariatrics

## 2020-12-06 ENCOUNTER — Ambulatory Visit (INDEPENDENT_AMBULATORY_CARE_PROVIDER_SITE_OTHER): Payer: 59 | Admitting: Bariatrics

## 2020-12-06 ENCOUNTER — Encounter (INDEPENDENT_AMBULATORY_CARE_PROVIDER_SITE_OTHER): Payer: Self-pay | Admitting: Bariatrics

## 2020-12-06 ENCOUNTER — Other Ambulatory Visit: Payer: Self-pay

## 2020-12-06 VITALS — BP 129/86 | HR 63 | Temp 97.4°F | Ht 65.0 in | Wt 223.0 lb

## 2020-12-06 DIAGNOSIS — E8881 Metabolic syndrome: Secondary | ICD-10-CM | POA: Diagnosis not present

## 2020-12-06 DIAGNOSIS — M25569 Pain in unspecified knee: Secondary | ICD-10-CM | POA: Diagnosis not present

## 2020-12-06 DIAGNOSIS — Z6837 Body mass index (BMI) 37.0-37.9, adult: Secondary | ICD-10-CM

## 2020-12-07 ENCOUNTER — Encounter (INDEPENDENT_AMBULATORY_CARE_PROVIDER_SITE_OTHER): Payer: Self-pay | Admitting: Bariatrics

## 2020-12-07 NOTE — Progress Notes (Signed)
Chief Complaint:   OBESITY Kendra Day is here to discuss her progress with her obesity treatment plan along with follow-up of her obesity related diagnoses. Kendra Day is on the Category 1 Plan and states she is following her eating plan approximately 70% of the time. Kendra Day states she is walking for 20-40 minutes 6 times per week.  Today's visit was #: 4 Starting weight: 239 lbs Starting date: 10/12/2020 Today's weight: 223 lbs Today's date: 12/06/2020 Total lbs lost to date: 16 lbs Total lbs lost since last in-office visit: 4 lbs  Interim History: Kendra Day is down 4 pounds since her last visit.  She struggled over the holidays.  She is struggling with dinner.  Subjective:   1. Knee pain, unspecified chronicity, unspecified laterality Kendra Day takes no medications for her knee pain.  2. Insulin resistance Kendra Day has a diagnosis of insulin resistance based on her elevated fasting insulin level >5. She continues to work on diet and exercise to decrease her risk of diabetes.  She is on no medications.  Denies polyphagia.   Lab Results  Component Value Date   INSULIN 10.8 10/12/2020   Assessment/Plan:   1. Knee pain, unspecified chronicity, unspecified laterality No pounding exercises.  2. Insulin resistance Kendra Day will continue to work on weight loss, exercise, and decreasing simple carbohydrates to help decrease the risk of diabetes. Kendra Day agreed to follow-up with [name] as directed to closely monitor her progress.  3. Class 2 severe obesity with serious comorbidity and body mass index (BMI) of 37.0 to 37.9 in adult, unspecified obesity type [facility])  Kendra Day is currently in the action stage of change. As such, her goal is to continue with weight loss efforts. She has agreed to the Category 1 Plan.   She will work on meal planning and intentional eating.  Exercise goals: All adults should avoid inactivity. Some physical activity is better than none, and adults who  participate in any amount of physical activity gain some health benefits.  Behavioral modification strategies: increasing lean protein intake, decreasing simple carbohydrates, increasing vegetables, increasing water intake, decreasing eating out, no skipping meals, meal planning and cooking strategies, keeping healthy foods in the home and planning for success.  Kendra Day has agreed to follow-up with our clinic in 2-3 weeks. She was informed of the importance of frequent follow-up visits to maximize her success with intensive lifestyle modifications for her multiple health conditions.   Objective:   Blood pressure 129/86, pulse 63, temperature (!) 97.4 F (36.3 C), temperature source Oral, height 5\' 5"  (1.651 m), weight 223 lb (101.2 kg), last menstrual period 09/29/2020, SpO2 97 %. Body mass index is 37.11 kg/m.  General: Cooperative, alert, well developed, in no acute distress. HEENT: Conjunctivae and lids unremarkable. Cardiovascular: Regular rhythm.  Lungs: Normal work of breathing. Neurologic: No focal deficits.   Lab Results  Component Value Date   CREATININE 1.02 02/21/2014   BUN 20 02/21/2014   NA 141 02/21/2014   K 4.1 02/21/2014   CL 103 02/21/2014   CO2 25 02/21/2014   Lab Results  Component Value Date   INSULIN 10.8 10/12/2020   Lab Results  Component Value Date   TSH 0.681 02/08/2014   Lab Results  Component Value Date   WBC 7.6 02/21/2014   HGB 14.6 02/21/2014   HCT 41.6 02/21/2014   MCV 85.2 02/21/2014   PLT 218 02/21/2014   Attestation Statements:   Reviewed by clinician on day of visit: allergies, medications, problem list, medical history, surgical history,  family history, social history, and previous encounter notes.  Time spent on visit including pre-visit chart review and post-visit care and charting was 20 minutes.   I, Insurance claims handler, CMA, am acting as Energy manager for [employer], DO  I have reviewed the above documentation for accuracy and  completeness, and I agree with the above. Corinna Capra, DO

## 2020-12-09 ENCOUNTER — Ambulatory Visit (INDEPENDENT_AMBULATORY_CARE_PROVIDER_SITE_OTHER): Payer: 59 | Admitting: Psychology

## 2020-12-09 DIAGNOSIS — F4321 Adjustment disorder with depressed mood: Secondary | ICD-10-CM

## 2020-12-21 ENCOUNTER — Encounter (INDEPENDENT_AMBULATORY_CARE_PROVIDER_SITE_OTHER): Payer: Self-pay

## 2020-12-22 ENCOUNTER — Encounter (INDEPENDENT_AMBULATORY_CARE_PROVIDER_SITE_OTHER): Payer: Self-pay | Admitting: Bariatrics

## 2020-12-22 ENCOUNTER — Telehealth (INDEPENDENT_AMBULATORY_CARE_PROVIDER_SITE_OTHER): Payer: 59 | Admitting: Bariatrics

## 2020-12-22 DIAGNOSIS — E8881 Metabolic syndrome: Secondary | ICD-10-CM

## 2020-12-22 DIAGNOSIS — N183 Chronic kidney disease, stage 3 unspecified: Secondary | ICD-10-CM

## 2020-12-22 DIAGNOSIS — Z6837 Body mass index (BMI) 37.0-37.9, adult: Secondary | ICD-10-CM

## 2020-12-26 ENCOUNTER — Encounter (INDEPENDENT_AMBULATORY_CARE_PROVIDER_SITE_OTHER): Payer: Self-pay | Admitting: Bariatrics

## 2020-12-26 NOTE — Progress Notes (Signed)
TeleHealth Visit:  Due to the COVID-19 pandemic, this visit was completed with telemedicine (audio/video) technology to reduce patient and provider exposure as well as to preserve personal protective equipment.   [name] has verbally consented to this TeleHealth visit. The patient is located at home, the provider is located at the [facility] office. The participants in this visit include the listed provider and patient. The visit was conducted today via MyChart video.  Chief Complaint: OBESITY Kendra Day is here to discuss her progress with her obesity treatment plan along with follow-up of her obesity related diagnoses. Kendra Day is on the Category 1 Plan and states she is following her eating plan approximately 95% of the time. Kendra Day states she is not exercising regularly at this time.  Today's visit was #: 5 Starting weight: 239 lbs Starting date: 10/12/2020  Interim History: Kendra Day states that she has lost weight.  Her weight at last visit was 223 pounds, and she reports 218 pounds today.  She has made better choices and is cooking more.  Subjective:   1. Insulin resistance Kendra Day has a diagnosis of insulin resistance based on her elevated fasting insulin level >5. She continues to work on diet and exercise to decrease her risk of diabetes.  She is on no medications for this.  Lab Results  Component Value Date   INSULIN 10.8 10/12/2020   2. Stage 3 chronic kidney disease, unspecified whether stage 3a or 3b CKD (HCC) Kendra Day is drinking adequate water.  Blood pressure is controlled without medication.  Lab Results  Component Value Date   CREATININE 1.02 02/21/2014   Lab Results  Component Value Date   CREATININE 1.02 02/21/2014   BUN 20 02/21/2014   NA 141 02/21/2014   K 4.1 02/21/2014   CL 103 02/21/2014   CO2 25 02/21/2014   Assessment/Plan:   1. Insulin resistance Kendra Day will continue to work on weight loss, exercise, and decreasing  simple carbohydrates to help decrease the risk of diabetes. Kendra Day agreed to follow-up with Kendra Day as directed to closely monitor her progress.  Continue to work on diet and exercise.  Decrease carbohydrates.  2. Stage 3 chronic kidney disease, unspecified whether stage 3a or 3b CKD (HCC) Lab results and trends reviewed. We discussed several lifestyle modifications today and she will continue to work on diet, exercise and weight loss efforts. Avoid nephrotoxic medications.  Continue to follow-up with PCP.  Keep adjusting hydration.  Counseling . Chronic kidney disease (CKD) happens when the kidneys are damaged over a long period of time. . Most of the time, this condition does not go away, but it can usually be controlled. Steps must be taken to slow down the kidney damage or to stop it from getting worse. . Intensive lifestyle modifications are the first line treatment for this issue.  Marland Kitchen Avoid buying foods that are: processed, frozen, or prepackaged to avoid excess salt.  3. Class 2 severe obesity with serious comorbidity and body mass index (BMI) of 37.0 to 37.9 in adult, unspecified obesity type [facility])  Kendra Day is currently in the action stage of change. As such, her goal is to continue with weight loss efforts. She has agreed to the Category 1 Plan.   She will work on meal planning and intentional eating.  Recipes II handout was given.  Exercise goals: All adults should avoid inactivity. Some physical activity is better than none, and adults who participate in any amount of physical activity gain some health benefits. Stay active.  Behavioral modification strategies: increasing lean protein intake, decreasing simple carbohydrates, increasing vegetables, increasing water intake, decreasing eating out, no skipping meals, meal planning and cooking strategies, keeping healthy foods in the home and planning for success.  Kendra Day has agreed to follow-up with our clinic in 2-3 weeks. She was  informed of the importance of frequent follow-up visits to maximize her success with intensive lifestyle modifications for her multiple health conditions.  Objective:   VITALS: Per patient if applicable, see vitals. GENERAL: Alert and in no acute distress. CARDIOPULMONARY: No increased WOB. Speaking in clear sentences.  PSYCH: Pleasant and cooperative. Speech normal rate and rhythm. Affect is appropriate. Insight and judgement are appropriate. Attention is focused, linear, and appropriate.  NEURO: Oriented as arrived to appointment on time with no prompting.   Lab Results  Component Value Date   CREATININE 1.02 02/21/2014   BUN 20 02/21/2014   NA 141 02/21/2014   K 4.1 02/21/2014   CL 103 02/21/2014   CO2 25 02/21/2014   Lab Results  Component Value Date   INSULIN 10.8 10/12/2020   Lab Results  Component Value Date   TSH 0.681 02/08/2014   Lab Results  Component Value Date   WBC 7.6 02/21/2014   HGB 14.6 02/21/2014   HCT 41.6 02/21/2014   MCV 85.2 02/21/2014   PLT 218 02/21/2014   Attestation Statements:   Reviewed by clinician on day of visit: allergies, medications, problem list, medical history, surgical history, family history, social history, and previous encounter notes.  I, Insurance claims handler, CMA, am acting as Energy manager for [employer], DO  I have reviewed the above documentation for accuracy and completeness, and I agree with the above. Corinna Capra, DO

## 2020-12-30 ENCOUNTER — Ambulatory Visit (INDEPENDENT_AMBULATORY_CARE_PROVIDER_SITE_OTHER): Payer: 59 | Admitting: Psychology

## 2020-12-30 DIAGNOSIS — F4321 Adjustment disorder with depressed mood: Secondary | ICD-10-CM | POA: Diagnosis not present

## 2021-01-12 ENCOUNTER — Encounter (INDEPENDENT_AMBULATORY_CARE_PROVIDER_SITE_OTHER): Payer: Self-pay | Admitting: Bariatrics

## 2021-01-12 ENCOUNTER — Ambulatory Visit (INDEPENDENT_AMBULATORY_CARE_PROVIDER_SITE_OTHER): Payer: 59 | Admitting: Bariatrics

## 2021-01-12 ENCOUNTER — Other Ambulatory Visit: Payer: Self-pay

## 2021-01-12 VITALS — BP 116/75 | HR 65 | Temp 98.0°F | Ht 65.0 in | Wt 211.0 lb

## 2021-01-12 DIAGNOSIS — E559 Vitamin D deficiency, unspecified: Secondary | ICD-10-CM | POA: Diagnosis not present

## 2021-01-12 DIAGNOSIS — Z6835 Body mass index (BMI) 35.0-35.9, adult: Secondary | ICD-10-CM | POA: Diagnosis not present

## 2021-01-12 DIAGNOSIS — E8881 Metabolic syndrome: Secondary | ICD-10-CM

## 2021-01-16 NOTE — Progress Notes (Unsigned)
Chief Complaint:   OBESITY Kendra Day is here to discuss her progress with her obesity treatment plan along with follow-up of her obesity related diagnoses. Kendra Day is on the Category 1 Plan and states she is following her eating plan approximately 90% of the time. Kendra Day states she is doing 0 minutes 0 times per week.  Today's visit was #: 6 Starting weight: 239 lbs Starting date: 10/12/2020 Today's weight: 211 lbs Today's date: 01/12/2021 Total lbs lost to date: 28 Total lbs lost since last in-office visit: 12  Interim History: Kendra Day is down 12 lbs today, since her last visit.  Subjective:   1. Vitamin D deficiency Kendra Day is taking Vit D, and her last Vit D level was 45.9.  2. Insulin resistance Kendra Day is not on medications, and last insulin level was 10.8.  Assessment/Plan:   1. Vitamin D deficiency Low Vitamin D level contributes to fatigue and are associated with obesity, breast, and colon cancer. Kendra Day agreed to continue taking Vitamin D 2,000 IU daily and will follow-up for routine testing of Vitamin D, at least 2-3 times per year to avoid over-replacement.  2. Insulin resistance Kendra Day will continue to work on weight loss, exercise, increasing healthy fats, and decreasing simple carbohydrates to help decrease the risk of diabetes. Kendra Day agreed to follow-up with [name] as directed to closely monitor her progress.  3. Class 2 severe obesity due to excess calories with serious comorbidity and body mass index (BMI) of 35.0 to 35.9 in adult [facility]) Kendra Day is currently in the action stage of change. As such, her goal is to continue with weight loss efforts. She has agreed to the Category 1 Plan.   We discussed mindful eating, and Easter will stay strictly with the plan.  Exercise goals: She will go back to the gym, and use exercise bands.  Behavioral modification strategies: increasing lean protein intake, decreasing simple carbohydrates, increasing  vegetables, increasing water intake, decreasing eating out, no skipping meals, meal planning and cooking strategies, keeping healthy foods in the home and planning for success.  Kendra Day has agreed to follow-up with our clinic in 2 to 3 weeks. She was informed of the importance of frequent follow-up visits to maximize her success with intensive lifestyle modifications for her multiple health conditions.   Objective:   Blood pressure 116/75, pulse 65, temperature 98 F (36.7 C), height 5\' 5"  (1.651 m), weight 211 lb (95.7 kg), last menstrual period 09/29/2020, SpO2 100 %. Body mass index is 35.11 kg/m.  General: Cooperative, alert, well developed, in no acute distress. HEENT: Conjunctivae and lids unremarkable. Cardiovascular: Regular rhythm.  Lungs: Normal work of breathing. Neurologic: No focal deficits.   Lab Results  Component Value Date   CREATININE 1.02 02/21/2014   BUN 20 02/21/2014   NA 141 02/21/2014   K 4.1 02/21/2014   CL 103 02/21/2014   CO2 25 02/21/2014   No results found for: ALT, AST, GGT, ALKPHOS, BILITOT No results found for: HGBA1C Lab Results  Component Value Date   INSULIN 10.8 10/12/2020   Lab Results  Component Value Date   TSH 0.681 02/08/2014   No results found for: CHOL, HDL, LDLCALC, LDLDIRECT, TRIG, CHOLHDL Lab Results  Component Value Date   WBC 7.6 02/21/2014   HGB 14.6 02/21/2014   HCT 41.6 02/21/2014   MCV 85.2 02/21/2014   PLT 218 02/21/2014   No results found for: IRON, TIBC, FERRITIN  Attestation Statements:   Reviewed by clinician on day of visit: allergies, medications,  problem list, medical history, surgical history, family history, social history, and previous encounter notes.   Wilhemena Durie, am acting as Location manager for [employer], DO.  I have reviewed the above documentation for accuracy and completeness, and I agree with the above. Jearld Lesch, DO

## 2021-01-19 ENCOUNTER — Encounter (INDEPENDENT_AMBULATORY_CARE_PROVIDER_SITE_OTHER): Payer: Self-pay | Admitting: Bariatrics

## 2021-01-26 ENCOUNTER — Ambulatory Visit (INDEPENDENT_AMBULATORY_CARE_PROVIDER_SITE_OTHER): Payer: 59 | Admitting: Psychology

## 2021-01-26 DIAGNOSIS — F4321 Adjustment disorder with depressed mood: Secondary | ICD-10-CM

## 2021-02-02 ENCOUNTER — Ambulatory Visit (INDEPENDENT_AMBULATORY_CARE_PROVIDER_SITE_OTHER): Payer: 59 | Admitting: Bariatrics

## 2021-02-02 ENCOUNTER — Other Ambulatory Visit: Payer: Self-pay

## 2021-02-02 ENCOUNTER — Encounter (INDEPENDENT_AMBULATORY_CARE_PROVIDER_SITE_OTHER): Payer: Self-pay | Admitting: Bariatrics

## 2021-02-02 VITALS — BP 117/76 | HR 63 | Temp 97.7°F | Ht 65.0 in | Wt 207.0 lb

## 2021-02-02 DIAGNOSIS — E559 Vitamin D deficiency, unspecified: Secondary | ICD-10-CM | POA: Diagnosis not present

## 2021-02-02 DIAGNOSIS — Z6834 Body mass index (BMI) 34.0-34.9, adult: Secondary | ICD-10-CM

## 2021-02-02 DIAGNOSIS — E669 Obesity, unspecified: Secondary | ICD-10-CM

## 2021-02-06 ENCOUNTER — Encounter (INDEPENDENT_AMBULATORY_CARE_PROVIDER_SITE_OTHER): Payer: Self-pay | Admitting: Bariatrics

## 2021-02-06 NOTE — Progress Notes (Signed)
Chief Complaint:   OBESITY Kendra Day is here to discuss her progress with her obesity treatment plan along with follow-up of her obesity related diagnoses. Kendra Day is on the Category 1 Plan and states she is following her eating plan approximately 60% of the time. Kendra Day states she is walking for 20-30 minutes 3 times per week.  Today's visit was #: 7 Starting weight: 239 lbs Starting date: 10/12/2020 Today's weight: 207 lbs Today's date: 02/02/2021 Total lbs lost to date: 32 lbs Total lbs lost since last in-office visit: 4 lbs  Interim History: Kendra Day is down on additional 4 pounds and is doing well overall.  She is still doing better with water.  Subjective:   1. Vitamin D deficiency Shatia's Vitamin D level was 45.9 on 10/12/2020. She is currently taking OTC vitamin D 2,000 IU each day. She denies nausea, vomiting or muscle weakness.  Assessment/Plan:   1. Vitamin D deficiency Low Vitamin D level contributes to fatigue and are associated with obesity, breast, and colon cancer. She agrees to continue to take OTC vitamin D 2,000 IU daily and will follow-up for routine testing of Vitamin D, at least 2-3 times per year to avoid over-replacement.  2. Class 1 obesity with serious comorbidity and body mass index (BMI) of 34.0 to 34.9 in adult, unspecified obesity type  Kendra Day is currently in the action stage of change. As such, her goal is to continue with weight loss efforts. She has agreed to the Category 1 Plan.   She will work on meal planning, intentional eating, and will continue to adhere closely to the plan.  Exercise goals: Continue walking and increase activity.  Behavioral modification strategies: increasing lean protein intake, decreasing simple carbohydrates, increasing vegetables, increasing water intake, decreasing eating out, no skipping meals, meal planning and cooking strategies, keeping healthy foods in the home and planning for success.  Kendra Day has  agreed to follow-up with our clinic in 2 weeks. She was informed of the importance of frequent follow-up visits to maximize her success with intensive lifestyle modifications for her multiple health conditions.   Objective:   Blood pressure 117/76, pulse 63, temperature 97.7 F (36.5 C), height 5\' 5"  (1.651 m), weight 207 lb (93.9 kg), SpO2 98 %. Body mass index is 34.45 kg/m.  General: Cooperative, alert, well developed, in no acute distress. HEENT: Conjunctivae and lids unremarkable. Cardiovascular: Regular rhythm.  Lungs: Normal work of breathing. Neurologic: No focal deficits.   Lab Results  Component Value Date   CREATININE 1.02 02/21/2014   BUN 20 02/21/2014   NA 141 02/21/2014   K 4.1 02/21/2014   CL 103 02/21/2014   CO2 25 02/21/2014   Lab Results  Component Value Date   INSULIN 10.8 10/12/2020   Lab Results  Component Value Date   TSH 0.681 02/08/2014   Lab Results  Component Value Date   WBC 7.6 02/21/2014   HGB 14.6 02/21/2014   HCT 41.6 02/21/2014   MCV 85.2 02/21/2014   PLT 218 02/21/2014   Attestation Statements:   Reviewed by clinician on day of visit: allergies, medications, problem list, medical history, surgical history, family history, social history, and previous encounter notes.  Time spent on visit including pre-visit chart review and post-visit care and charting was 20 minutes.   I, 02/23/2014, CMA, am acting as Insurance claims handler for Energy manager, DO  I have reviewed the above documentation for accuracy and completeness, and I agree with the above. [employer], DO

## 2021-02-21 ENCOUNTER — Ambulatory Visit (INDEPENDENT_AMBULATORY_CARE_PROVIDER_SITE_OTHER): Payer: 59 | Admitting: Bariatrics

## 2021-02-21 ENCOUNTER — Ambulatory Visit (INDEPENDENT_AMBULATORY_CARE_PROVIDER_SITE_OTHER): Payer: 59 | Admitting: Psychology

## 2021-02-21 DIAGNOSIS — F4321 Adjustment disorder with depressed mood: Secondary | ICD-10-CM

## 2021-03-01 ENCOUNTER — Ambulatory Visit (INDEPENDENT_AMBULATORY_CARE_PROVIDER_SITE_OTHER): Payer: 59 | Admitting: Bariatrics

## 2021-03-01 ENCOUNTER — Encounter (INDEPENDENT_AMBULATORY_CARE_PROVIDER_SITE_OTHER): Payer: Self-pay | Admitting: Bariatrics

## 2021-03-01 ENCOUNTER — Other Ambulatory Visit: Payer: Self-pay

## 2021-03-01 VITALS — BP 127/77 | HR 60 | Temp 97.8°F | Ht 65.0 in | Wt 201.0 lb

## 2021-03-01 DIAGNOSIS — Z6839 Body mass index (BMI) 39.0-39.9, adult: Secondary | ICD-10-CM | POA: Diagnosis not present

## 2021-03-01 DIAGNOSIS — E8881 Metabolic syndrome: Secondary | ICD-10-CM | POA: Diagnosis not present

## 2021-03-01 DIAGNOSIS — E559 Vitamin D deficiency, unspecified: Secondary | ICD-10-CM

## 2021-03-07 ENCOUNTER — Encounter (INDEPENDENT_AMBULATORY_CARE_PROVIDER_SITE_OTHER): Payer: Self-pay | Admitting: Bariatrics

## 2021-03-07 NOTE — Progress Notes (Signed)
Chief Complaint:   OBESITY Kendra Day is here to discuss her progress with her obesity treatment plan along with follow-up of her obesity related diagnoses. Kendra Day is on the Category 1 Plan and states she is following her eating plan approximately 50% of the time. [name] states she is walking for 30 minutes 4-5 times per week.  Today's visit was #: 8 Starting weight: 239 lbs Starting date: 10/12/2020 Today's weight: 201 lbs Today's date: 03/01/2021 Total lbs lost to date: 38 lbs Total lbs lost since last in-office visit: 6 lbs  Interim History: Kendra Day is down an additional 6 pounds since her last visit.  She has been out of town and having her kitchen repaired.  Subjective:   1. Insulin resistance Yusra has a diagnosis of insulin resistance based on her elevated fasting insulin level >5. She continues to work on diet and exercise to decrease her risk of diabetes.  Insulin level is 10.8.  Lab Results  Component Value Date   INSULIN 10.8 10/12/2020   2. Vitamin D deficiency Lady's Vitamin D level was 45.9 on 10/12/2020. She is currently taking OTC vitamin D 2,000 IU each day. She denies nausea, vomiting or muscle weakness.  She gets minimal sun exposure.  Assessment/Plan:   1. Insulin resistance Kendra Day will continue to work on weight loss, exercise, and decreasing simple carbohydrates to help decrease the risk of diabetes. Kendra Day agreed to follow-up with [name] as directed to closely monitor her progress.    2. Vitamin D deficiency Low Vitamin D level contributes to fatigue and are associated with obesity, breast, and colon cancer. She agrees to continue to take prescription Vitamin D @50 ,000 IU every week and will follow-up for routine testing of Vitamin D, at least 2-3 times per year to avoid over-replacement.  3. Obesity, current BMI 33.6  Kendra Day is currently in the action stage of change. As such, her goal is to continue with weight loss efforts. She has  agreed to the Category 1 Plan.   She will work on meal planning, will adhere closely to the plan, will increase water intake, and will start journaling.  Exercise goals: Increased walking.  Behavioral modification strategies: increasing lean protein intake, decreasing simple carbohydrates, increasing vegetables, increasing water intake, decreasing eating out, no skipping meals, meal planning and cooking strategies, keeping healthy foods in the home and planning for success.  Kendra Day has agreed to follow-up with our clinic in 2-3 weeks. She was informed of the importance of frequent follow-up visits to maximize her success with intensive lifestyle modifications for her multiple health conditions.   Objective:   Blood pressure 127/77, pulse 60, temperature 97.8 F (36.6 C), height 5\' 5"  (1.651 m), weight 201 lb (91.2 kg), SpO2 99 %. Body mass index is 33.45 kg/m.  General: Cooperative, alert, well developed, in no acute distress. HEENT: Conjunctivae and lids unremarkable. Cardiovascular: Regular rhythm.  Lungs: Normal work of breathing. Neurologic: No focal deficits.   Lab Results  Component Value Date   CREATININE 1.02 02/21/2014   BUN 20 02/21/2014   NA 141 02/21/2014   K 4.1 02/21/2014   CL 103 02/21/2014   CO2 25 02/21/2014   Lab Results  Component Value Date   INSULIN 10.8 10/12/2020   Lab Results  Component Value Date   TSH 0.681 02/08/2014   Lab Results  Component Value Date   WBC 7.6 02/21/2014   HGB 14.6 02/21/2014   HCT 41.6 02/21/2014   MCV 85.2 02/21/2014   PLT 218  02/21/2014   Attestation Statements:   Reviewed by clinician on day of visit: allergies, medications, problem list, medical history, surgical history, family history, social history, and previous encounter notes.  I, Insurance claims handler, CMA, am acting as Energy manager for [employer], DO  I have reviewed the above documentation for accuracy and completeness, and I agree with the above. Corinna Capra, DO

## 2021-03-14 ENCOUNTER — Ambulatory Visit (INDEPENDENT_AMBULATORY_CARE_PROVIDER_SITE_OTHER): Payer: 59 | Admitting: Psychology

## 2021-03-14 DIAGNOSIS — F4321 Adjustment disorder with depressed mood: Secondary | ICD-10-CM

## 2021-03-27 ENCOUNTER — Encounter (INDEPENDENT_AMBULATORY_CARE_PROVIDER_SITE_OTHER): Payer: Self-pay | Admitting: Bariatrics

## 2021-03-27 ENCOUNTER — Ambulatory Visit (INDEPENDENT_AMBULATORY_CARE_PROVIDER_SITE_OTHER): Payer: 59 | Admitting: Bariatrics

## 2021-03-27 ENCOUNTER — Other Ambulatory Visit: Payer: Self-pay

## 2021-03-27 VITALS — BP 124/80 | HR 63 | Temp 97.5°F | Ht 65.0 in | Wt 200.0 lb

## 2021-03-27 DIAGNOSIS — Z6839 Body mass index (BMI) 39.0-39.9, adult: Secondary | ICD-10-CM

## 2021-03-27 DIAGNOSIS — E559 Vitamin D deficiency, unspecified: Secondary | ICD-10-CM | POA: Diagnosis not present

## 2021-03-27 DIAGNOSIS — E8881 Metabolic syndrome: Secondary | ICD-10-CM | POA: Diagnosis not present

## 2021-03-30 NOTE — Progress Notes (Signed)
Chief Complaint:   OBESITY Kendra Day is here to discuss her progress with her obesity treatment plan along with follow-up of her obesity related diagnoses. Kendra Day is on the Category 1 Plan and states she is following her eating plan approximately 25% of the time. Kendra Day states she is walking for 20-25 minutes 4 times per week.  Today's visit was #: 9 Starting weight: 239 lbs Starting date: 10/12/2020 Today's weight: 200 lbs Today's date: 03/27/2021 Total lbs lost to date: 23 lbs Total lbs lost since last in-office visit: 1 lb  Interim History: She is down an additional pound today.  Subjective:   1. Vitamin D deficiency Kendra Day's Vitamin D level was 45.9 on 10/12/2020. She is currently taking OTC vitamin D 2,000 IU each day. She denies nausea, vomiting or muscle weakness.  2. Insulin resistance Kendra Day has a diagnosis of insulin resistance based on her elevated fasting insulin level >5. She continues to work on diet and exercise to decrease her risk of diabetes.  She is on no medications for this.  Lab Results  Component Value Date   INSULIN 10.8 10/12/2020   Assessment/Plan:   1. Vitamin D deficiency Low Vitamin D level contributes to fatigue and are associated with obesity, breast, and colon cancer. She agrees to continue to take prescription Vitamin D @50 ,000 IU every week and will follow-up for routine testing of Vitamin D, at least 2-3 times per year to avoid over-replacement.  2. Insulin resistance Kendra Day will continue to work on weight loss, exercise, and decreasing simple carbohydrates to help decrease the risk of diabetes. Kendra Day agreed to follow-up with Kendra Day as directed to closely monitor her progress.  Continue diet and exercise.  3. Obesity, current BMI 33  Kendra Day is currently in the action stage of change. As such, her goal is to continue with weight loss efforts. She has agreed to the Category 1 Plan.   She will work on meal planning, intentional  eating, and will increase her walking.  Exercise goals: Continue walking.  Behavioral modification strategies: increasing lean protein intake, decreasing simple carbohydrates, increasing vegetables, increasing water intake, decreasing eating out, no skipping meals, meal planning and cooking strategies, keeping healthy foods in the home and planning for success.  Kendra Day has agreed to follow-up with our clinic in 2-3 weeks. She was informed of the importance of frequent follow-up visits to maximize her success with intensive lifestyle modifications for her multiple health conditions.   Objective:   Blood pressure 124/80, pulse 63, temperature (!) 97.5 F (36.4 C), height 5\' 5"  (1.651 m), weight 200 lb (90.7 kg), SpO2 100 %. Body mass index is 33.28 kg/m.  General: Cooperative, alert, well developed, in no acute distress. HEENT: Conjunctivae and lids unremarkable. Cardiovascular: Regular rhythm.  Lungs: Normal work of breathing. Neurologic: No focal deficits.   Lab Results  Component Value Date   CREATININE 1.02 02/21/2014   BUN 20 02/21/2014   NA 141 02/21/2014   K 4.1 02/21/2014   CL 103 02/21/2014   CO2 25 02/21/2014   Lab Results  Component Value Date   INSULIN 10.8 10/12/2020   Lab Results  Component Value Date   TSH 0.681 02/08/2014   Lab Results  Component Value Date   WBC 7.6 02/21/2014   HGB 14.6 02/21/2014   HCT 41.6 02/21/2014   MCV 85.2 02/21/2014   PLT 218 02/21/2014   Attestation Statements:   Reviewed by clinician on day of visit: allergies, medications, problem list, medical history, surgical history,  family history, social history, and previous encounter notes.  Time spent on visit including pre-visit chart review and post-visit care and charting was 20 minutes.   I, Insurance claims handler, CMA, am acting as Energy manager for [employer], DO  I have reviewed the above documentation for accuracy and completeness, and I agree with the above. Corinna Capra,  DO

## 2021-04-03 ENCOUNTER — Encounter (INDEPENDENT_AMBULATORY_CARE_PROVIDER_SITE_OTHER): Payer: Self-pay | Admitting: Bariatrics

## 2021-04-07 ENCOUNTER — Ambulatory Visit (INDEPENDENT_AMBULATORY_CARE_PROVIDER_SITE_OTHER): Payer: 59 | Admitting: Psychology

## 2021-04-07 DIAGNOSIS — F4321 Adjustment disorder with depressed mood: Secondary | ICD-10-CM | POA: Diagnosis not present

## 2021-04-19 ENCOUNTER — Other Ambulatory Visit: Payer: Self-pay

## 2021-04-19 ENCOUNTER — Encounter (INDEPENDENT_AMBULATORY_CARE_PROVIDER_SITE_OTHER): Payer: Self-pay | Admitting: Bariatrics

## 2021-04-19 ENCOUNTER — Ambulatory Visit (INDEPENDENT_AMBULATORY_CARE_PROVIDER_SITE_OTHER): Payer: 59 | Admitting: Bariatrics

## 2021-04-19 VITALS — BP 119/82 | HR 59 | Temp 97.6°F | Ht 65.0 in | Wt 196.0 lb

## 2021-04-19 DIAGNOSIS — E559 Vitamin D deficiency, unspecified: Secondary | ICD-10-CM | POA: Diagnosis not present

## 2021-04-19 DIAGNOSIS — N183 Chronic kidney disease, stage 3 unspecified: Secondary | ICD-10-CM | POA: Diagnosis not present

## 2021-04-19 DIAGNOSIS — Z6839 Body mass index (BMI) 39.0-39.9, adult: Secondary | ICD-10-CM

## 2021-04-19 DIAGNOSIS — E8881 Metabolic syndrome: Secondary | ICD-10-CM | POA: Diagnosis not present

## 2021-04-19 DIAGNOSIS — Z9189 Other specified personal risk factors, not elsewhere classified: Secondary | ICD-10-CM | POA: Diagnosis not present

## 2021-04-20 LAB — COMPREHENSIVE METABOLIC PANEL
ALT: 25 IU/L (ref 0–32)
AST: 22 IU/L (ref 0–40)
Albumin/Globulin Ratio: 1.9 (ref 1.2–2.2)
Albumin: 4.4 g/dL (ref 3.8–4.9)
Alkaline Phosphatase: 129 IU/L — ABNORMAL HIGH (ref 44–121)
BUN/Creatinine Ratio: 21 (ref 9–23)
BUN: 23 mg/dL (ref 6–24)
Bilirubin Total: 0.2 mg/dL (ref 0.0–1.2)
CO2: 23 mmol/L (ref 20–29)
Calcium: 9.8 mg/dL (ref 8.7–10.2)
Chloride: 101 mmol/L (ref 96–106)
Creatinine, Ser: 1.07 mg/dL — ABNORMAL HIGH (ref 0.57–1.00)
Globulin, Total: 2.3 g/dL (ref 1.5–4.5)
Glucose: 91 mg/dL (ref 65–99)
Potassium: 5 mmol/L (ref 3.5–5.2)
Sodium: 141 mmol/L (ref 134–144)
Total Protein: 6.7 g/dL (ref 6.0–8.5)
eGFR: 63 mL/min/{1.73_m2} (ref >59)

## 2021-04-20 LAB — HEMOGLOBIN A1C
Est. average glucose Bld gHb Est-mCnc: 114 mg/dL
Hgb A1c MFr Bld: 5.6 % (ref 4.8–5.6)

## 2021-04-20 LAB — INSULIN, RANDOM: INSULIN: 5.8 u[IU]/mL (ref 2.6–24.9)

## 2021-04-20 LAB — VITAMIN D 25 HYDROXY (VIT D DEFICIENCY, FRACTURES): Vit D, 25-Hydroxy: 79.8 ng/mL (ref 30.0–100.0)

## 2021-04-20 NOTE — Progress Notes (Signed)
Chief Complaint:   OBESITY Kendra Day is here to discuss her progress with her obesity treatment plan along with follow-up of her obesity related diagnoses. Kendra Day is on the Category 1 Plan and states she is following her eating plan approximately 50% of the time. Lily states she is walking 20 minutes 3-4 times per week.  Today's visit was #: 10 Starting weight: 239 lbs Starting date: 10/12/2020 Today's weight: 196 lbs Today's date: 04/19/2021 Total lbs lost to date: 43 lbs Total lbs lost since last in-office visit: 4  Interim History: Kendra Day is down an additional 4 lbs and doing well overall. She will get back in a food routine.  Subjective:   1. Insulin resistance Smt. is not on medication.  2. Vitamin D deficiency Kendra Day is taking OTC Vit D 2,000 IU.  3. Stage 3 chronic kidney disease, unspecified whether stage 3a or 3b CKD (HCC) Kendra Day has no restriction in protein or other nutrients.   4. At risk for hypoglycemia Kendra Day is at increased risk for hypoglycemia due to insulin resistance.  Assessment/Plan:   1. Insulin resistance Tahjae will continue to work on weight loss, exercise, and decreasing simple carbohydrates to help decrease the risk of diabetes. Kendra Day agreed to follow-up with [name] as directed to closely monitor her progress. Continue to work on diet and exercise. Check labs today.  - Hemoglobin A1c - Insulin, random  2. Vitamin D deficiency Low Vitamin D level contributes to fatigue and are associated with obesity, breast, and colon cancer. She agrees to continue to take OTC Vitamin D @2 ,000 IU QD and will follow-up for routine testing of Vitamin D, at least 2-3 times per year to avoid over-replacement. Check labs today.  - VITAMIN D 25 Hydroxy (Vit-D Deficiency, Fractures)  3. Stage 3 chronic kidney disease, unspecified whether stage 3a or 3b CKD (HCC) Check labs today.  - Comprehensive metabolic panel  4. At risk for  hypoglycemia Kendra Day was given approximately 15 minutes of counseling today regarding prevention of hypoglycemia. She was advised of symptoms of hypoglycemia. Kendra Day was instructed to avoid skipping meals, eat regular protein rich meals and schedule low calorie snacks as needed.   Repetitive spaced learning was employed today to elicit superior memory formation and behavioral change  5. Obesity, current BMI 32  Kendra Day is currently in the action stage of change. As such, her goal is to continue with weight loss efforts. She has agreed to the Category 1 Plan.   Meal plan Intentional eating  Exercise goals: As is- Will go to the gym.  Behavioral modification strategies: increasing lean protein intake, decreasing simple carbohydrates, increasing vegetables, increasing water intake, decreasing eating out, no skipping meals, meal planning and cooking strategies, keeping healthy foods in the home and planning for success.  Kendra Day has agreed to follow-up with our clinic in 2-3 weeks. She was informed of the importance of frequent follow-up visits to maximize her success with intensive lifestyle modifications for her multiple health conditions.   Kendra Day was informed we would discuss her lab results at her next visit unless there is a critical issue that needs to be addressed sooner. Kendra Day agreed to keep her next visit at the agreed upon time to discuss these results.  Objective:   Blood pressure 119/82, pulse (!) 59, temperature 97.6 F (36.4 C), height 5\' 5"  (1.651 m), weight 196 lb (88.9 kg), SpO2 100 %. Body mass index is 32.62 kg/m.  General: Cooperative, alert, well developed, in no acute distress. HEENT: Conjunctivae  and lids unremarkable. Cardiovascular: Regular rhythm.  Lungs: Normal work of breathing. Neurologic: No focal deficits.   Lab Results  Component Value Date   CREATININE 1.07 (H) 04/19/2021   BUN 23 04/19/2021   NA 141 04/19/2021   K 5.0 04/19/2021   CL 101  04/19/2021   CO2 23 04/19/2021   Lab Results  Component Value Date   ALT 25 04/19/2021   AST 22 04/19/2021   ALKPHOS 129 (H) 04/19/2021   BILITOT 0.2 04/19/2021   Lab Results  Component Value Date   HGBA1C 5.6 04/19/2021   Lab Results  Component Value Date   INSULIN 5.8 04/19/2021   INSULIN 10.8 10/12/2020   Lab Results  Component Value Date   TSH 0.681 02/08/2014   No results found for: CHOL, HDL, LDLCALC, LDLDIRECT, TRIG, CHOLHDL Lab Results  Component Value Date   WBC 7.6 02/21/2014   HGB 14.6 02/21/2014   HCT 41.6 02/21/2014   MCV 85.2 02/21/2014   PLT 218 02/21/2014   No results found for: IRON, TIBC, FERRITIN  Attestation Statements:   Reviewed by clinician on day of visit: allergies, medications, problem list, medical history, surgical history, family history, social history, and previous encounter notes.   Edmund Hilda, CMA, am acting as Energy manager for [employer], DO.  I have reviewed the above documentation for accuracy and completeness, and I agree with the above. Corinna Capra, DO

## 2021-04-24 ENCOUNTER — Encounter (INDEPENDENT_AMBULATORY_CARE_PROVIDER_SITE_OTHER): Payer: Self-pay | Admitting: Bariatrics

## 2021-05-04 ENCOUNTER — Ambulatory Visit (INDEPENDENT_AMBULATORY_CARE_PROVIDER_SITE_OTHER): Payer: 59 | Admitting: Psychology

## 2021-05-04 DIAGNOSIS — F4321 Adjustment disorder with depressed mood: Secondary | ICD-10-CM | POA: Diagnosis not present

## 2021-05-15 ENCOUNTER — Ambulatory Visit (INDEPENDENT_AMBULATORY_CARE_PROVIDER_SITE_OTHER): Payer: 59 | Admitting: Bariatrics

## 2021-05-15 ENCOUNTER — Encounter (INDEPENDENT_AMBULATORY_CARE_PROVIDER_SITE_OTHER): Payer: Self-pay | Admitting: Bariatrics

## 2021-05-15 ENCOUNTER — Other Ambulatory Visit: Payer: Self-pay

## 2021-05-15 VITALS — BP 113/79 | HR 64 | Temp 97.7°F | Ht 65.0 in | Wt 194.0 lb

## 2021-05-15 DIAGNOSIS — E559 Vitamin D deficiency, unspecified: Secondary | ICD-10-CM | POA: Diagnosis not present

## 2021-05-15 DIAGNOSIS — E8881 Metabolic syndrome: Secondary | ICD-10-CM | POA: Diagnosis not present

## 2021-05-15 DIAGNOSIS — Z9189 Other specified personal risk factors, not elsewhere classified: Secondary | ICD-10-CM | POA: Diagnosis not present

## 2021-05-15 DIAGNOSIS — Z6839 Body mass index (BMI) 39.0-39.9, adult: Secondary | ICD-10-CM | POA: Diagnosis not present

## 2021-05-17 NOTE — Progress Notes (Signed)
Chief Complaint:   OBESITY Kendra Day is here to discuss her progress with her obesity treatment plan along with follow-up of her obesity related diagnoses. Kendra Day is on the Category 1 Plan and states she is following her eating plan approximately 60% of the time. Kendra Day states she is walking 30 minutes 3-4 times per week.  Today's visit was #: 11 Starting weight: 239 lbs Starting date: 10/12/2020 Today's weight: 194 lbs Today's date: 05/15/2021 Total lbs lost to date: 45 Total lbs lost since last in-office visit: 2  Interim History: Kendra Day is down an additional 2 lbs. She states that it has been hard over the last few weeks.  Subjective:   1. Insulin resistance Kendra Day is not taking medication. Her insulin level has improved from 10.8 to 5.8.   Lab Results  Component Value Date   INSULIN 5.8 04/19/2021   INSULIN 10.8 10/12/2020   Lab Results  Component Value Date   HGBA1C 5.6 04/19/2021   2. Vitamin D deficiency Kendra Day is taking OTC Vit D 2,000 IU daily.  3. At risk for dehydration Kendra Day is at risk for dehydration due to weather and history of acute kidney injury.   Assessment/Plan:   1. Insulin resistance Shakendra will continue to work on weight loss, exercise, and decreasing simple carbohydrates to help decrease the risk of diabetes. [name] agreed to follow-up with [name] as directed to closely monitor her progress.  2. Vitamin D deficiency Low Vitamin D level contributes to fatigue and are associated with obesity, breast, and colon cancer. She agrees to continue to take OTC Vitamin D @2 ,000 IU QD and will follow-up for routine testing of Vitamin D, at least 2-3 times per year to avoid over-replacement.  3. At risk for dehydration Kendra Day was given approximately 15 minutes dehydration prevention counseling today. Kendra Day is at risk for dehydration due to weight loss and current medication(s). She was encouraged to hydrate and monitor fluid status to  avoid dehydration as well as weight loss plateaus.    4. Obesity, current BMI 32  Kendra Day is currently in the action stage of change. As such, her goal is to continue with weight loss efforts. She has agreed to the Category 1 Plan.   Meal plan Mindful eating Less snacking Increase protein  Labs reviewed with pt.  Exercise goals:  As is  Behavioral modification strategies: increasing lean protein intake, decreasing simple carbohydrates, increasing vegetables, increasing water intake, decreasing eating out, no skipping meals, meal planning and cooking strategies, keeping healthy foods in the home, and planning for success.  Kendra Day has agreed to follow-up with our clinic in 2-3 weeks. She was informed of the importance of frequent follow-up visits to maximize her success with intensive lifestyle modifications for her multiple health conditions.   Objective:   Blood pressure 113/79, pulse 64, temperature 97.7 F (36.5 C), height 5\' 5"  (1.651 m), weight 194 lb (88 kg), SpO2 98 %. Body mass index is 32.28 kg/m.  General: Cooperative, alert, well developed, in no acute distress. HEENT: Conjunctivae and lids unremarkable. Cardiovascular: Regular rhythm.  Lungs: Normal work of breathing. Neurologic: No focal deficits.   Lab Results  Component Value Date   CREATININE 1.07 (H) 04/19/2021   BUN 23 04/19/2021   NA 141 04/19/2021   K 5.0 04/19/2021   CL 101 04/19/2021   CO2 23 04/19/2021   Lab Results  Component Value Date   ALT 25 04/19/2021   AST 22 04/19/2021   ALKPHOS 129 (H) 04/19/2021  BILITOT 0.2 04/19/2021   Lab Results  Component Value Date   HGBA1C 5.6 04/19/2021   Lab Results  Component Value Date   INSULIN 5.8 04/19/2021   INSULIN 10.8 10/12/2020   Lab Results  Component Value Date   TSH 0.681 02/08/2014   No results found for: CHOL, HDL, LDLCALC, LDLDIRECT, TRIG, CHOLHDL Lab Results  Component Value Date   WBC 7.6 02/21/2014   HGB 14.6 02/21/2014    HCT 41.6 02/21/2014   MCV 85.2 02/21/2014   PLT 218 02/21/2014   No results found for: IRON, TIBC, FERRITIN   Attestation Statements:   Reviewed by clinician on day of visit: allergies, medications, problem list, medical history, surgical history, family history, social history, and previous encounter notes.  Kendra Day, CMA, am acting as Energy manager for [employer], DO.  I have reviewed the above documentation for accuracy and completeness, and I agree with the above. Kendra Capra, DO

## 2021-05-25 ENCOUNTER — Encounter (INDEPENDENT_AMBULATORY_CARE_PROVIDER_SITE_OTHER): Payer: Self-pay | Admitting: Bariatrics

## 2021-05-29 ENCOUNTER — Ambulatory Visit (INDEPENDENT_AMBULATORY_CARE_PROVIDER_SITE_OTHER): Payer: 59 | Admitting: Psychology

## 2021-05-29 DIAGNOSIS — F4321 Adjustment disorder with depressed mood: Secondary | ICD-10-CM | POA: Diagnosis not present

## 2021-06-08 ENCOUNTER — Encounter (INDEPENDENT_AMBULATORY_CARE_PROVIDER_SITE_OTHER): Payer: Self-pay | Admitting: Bariatrics

## 2021-06-08 ENCOUNTER — Ambulatory Visit (INDEPENDENT_AMBULATORY_CARE_PROVIDER_SITE_OTHER): Payer: 59 | Admitting: Bariatrics

## 2021-06-08 ENCOUNTER — Other Ambulatory Visit: Payer: Self-pay

## 2021-06-08 VITALS — BP 114/78 | HR 69 | Temp 97.6°F | Ht 65.0 in | Wt 193.0 lb

## 2021-06-08 DIAGNOSIS — E8881 Metabolic syndrome: Secondary | ICD-10-CM

## 2021-06-08 DIAGNOSIS — Z6839 Body mass index (BMI) 39.0-39.9, adult: Secondary | ICD-10-CM

## 2021-06-08 DIAGNOSIS — E559 Vitamin D deficiency, unspecified: Secondary | ICD-10-CM

## 2021-06-13 NOTE — Progress Notes (Signed)
Chief Complaint:   OBESITY Kendra Day is here to discuss her progress with her obesity treatment plan along with follow-up of her obesity related diagnoses. Kendra Day is on the Category 1 Plan and states she is following her eating plan approximately 65% of the time. Kendra Day states she is walking for 30 minutes 4-5 times per week.  Today's visit was #: 12 Starting weight: 239 lbs Starting date: 10/12/2020 Today's weight: 193 lbs Today's date:06/08/2021 Total lbs lost to date: 46 lbs Total lbs lost since last in-office visit: 1 lb  Interim History: Kendra Day is down 1 additional lb.  She is doing ok with her water intake.  Subjective:   1. Insulin resistance Kendra Day is not on any medications.  2. Vitamin D deficiency Kendra Day is taking OTC Vitamin D.  Assessment/Plan:   1. Insulin resistance Kendra Day will decrease sweets ans starches, and she will continue exercise, activity, and her meal plan. Kendra Day agreed to follow-up with [name] as directed to closely monitor her progress.  2. Vitamin D deficiency Low Vitamin D level contributes to fatigue and are associated with obesity, breast, and colon cancer. Kendra Day agreed to continue taking OTC Vitamin D 2,000 IU daily and will follow-up for routine testing of Vitamin D, at least 2-3 times per year to avoid over-replacement.    3. Obesity, current BMI 32.1 Kendra Day is currently in the action stage of change. As such, her goal is to continue with weight loss efforts. She has agreed to the Category 1 Plan.   Kendra Day will continue to meal plan.  She will continue intentional eating. She will continue prepping more meals at home.  She will increase H2O.  Exercise goals:  As is.  Behavioral modification strategies: increasing lean protein intake, decreasing simple carbohydrates, increasing vegetables, increasing water intake, decreasing eating out, no skipping meals, meal planning and cooking strategies, keeping healthy foods in the  home, and planning for success.  Kendra Day has agreed to follow-up with our clinic in 3-4 weeks. She was informed of the importance of frequent follow-up visits to maximize her success with intensive lifestyle modifications for her multiple health conditions.   Objective:   Blood pressure 114/78, pulse 69, temperature 97.6 F (36.4 C), height 5\' 5"  (1.651 m), weight 193 lb (87.5 kg), SpO2 98 %. Body mass index is 32.12 kg/m.  General: Cooperative, alert, well developed, in no acute distress. HEENT: Conjunctivae and lids unremarkable. Cardiovascular: Regular rhythm.  Lungs: Normal work of breathing. Neurologic: No focal deficits.   Lab Results  Component Value Date   CREATININE 1.07 (H) 04/19/2021   BUN 23 04/19/2021   NA 141 04/19/2021   K 5.0 04/19/2021   CL 101 04/19/2021   CO2 23 04/19/2021   Lab Results  Component Value Date   ALT 25 04/19/2021   AST 22 04/19/2021   ALKPHOS 129 (H) 04/19/2021   BILITOT 0.2 04/19/2021   Lab Results  Component Value Date   HGBA1C 5.6 04/19/2021   Lab Results  Component Value Date   INSULIN 5.8 04/19/2021   INSULIN 10.8 10/12/2020   Lab Results  Component Value Date   TSH 0.681 02/08/2014   No results found for: CHOL, HDL, LDLCALC, LDLDIRECT, TRIG, CHOLHDL Lab Results  Component Value Date   VD25OH 79.8 04/19/2021   VD25OH 45.9 10/12/2020   Lab Results  Component Value Date   WBC 7.6 02/21/2014   HGB 14.6 02/21/2014   HCT 41.6 02/21/2014   MCV 85.2 02/21/2014   PLT 218 02/21/2014  No results found for: IRON, TIBC, FERRITIN   Attestation Statements:   Reviewed by clinician on day of visit: allergies, medications, problem list, medical history, surgical history, family history, social history, and previous encounter notes.  Time spent on visit including pre-visit chart review and post-visit care and charting was 20 minutes.   I, Jackson Latino, RMA, am acting as Energy manager for [employer], DO.  I have reviewed  the above documentation for accuracy and completeness, and I agree with the above. Corinna Capra, DO

## 2021-06-14 ENCOUNTER — Encounter (INDEPENDENT_AMBULATORY_CARE_PROVIDER_SITE_OTHER): Payer: Self-pay | Admitting: Bariatrics

## 2021-06-27 ENCOUNTER — Ambulatory Visit: Payer: 59 | Admitting: Psychology

## 2021-06-28 ENCOUNTER — Other Ambulatory Visit: Payer: Self-pay | Admitting: Obstetrics and Gynecology

## 2021-06-28 DIAGNOSIS — Z1231 Encounter for screening mammogram for malignant neoplasm of breast: Secondary | ICD-10-CM

## 2021-06-29 ENCOUNTER — Encounter (INDEPENDENT_AMBULATORY_CARE_PROVIDER_SITE_OTHER): Payer: Self-pay | Admitting: Bariatrics

## 2021-06-29 ENCOUNTER — Other Ambulatory Visit: Payer: Self-pay

## 2021-06-29 ENCOUNTER — Ambulatory Visit (INDEPENDENT_AMBULATORY_CARE_PROVIDER_SITE_OTHER): Payer: 59 | Admitting: Bariatrics

## 2021-06-29 VITALS — BP 138/87 | HR 66 | Temp 97.9°F | Ht 65.0 in | Wt 191.0 lb

## 2021-06-29 DIAGNOSIS — E559 Vitamin D deficiency, unspecified: Secondary | ICD-10-CM | POA: Diagnosis not present

## 2021-06-29 DIAGNOSIS — Z6839 Body mass index (BMI) 39.0-39.9, adult: Secondary | ICD-10-CM

## 2021-06-29 DIAGNOSIS — E8881 Metabolic syndrome: Secondary | ICD-10-CM

## 2021-06-30 ENCOUNTER — Ambulatory Visit (INDEPENDENT_AMBULATORY_CARE_PROVIDER_SITE_OTHER): Payer: 59 | Admitting: Psychology

## 2021-06-30 DIAGNOSIS — F4321 Adjustment disorder with depressed mood: Secondary | ICD-10-CM

## 2021-07-03 NOTE — Progress Notes (Signed)
Chief Complaint:   OBESITY Kendra Day is here to discuss her progress with her obesity treatment plan along with follow-up of her obesity related diagnoses. [name] is on the Category 1 Plan and states she is following her eating plan approximately 60% of the time. [name] states she is walking for 30 minutes 3-4 times per week.  Today's visit was #: 13 Starting weight: 239 lbs Starting date: 10/12/2020 Today's weight: 191 lbs Today's date: 06/29/2021 Total lbs lost to date: 48 lbs Total lbs lost since last in-office visit: 2 lbs  Interim History: Kendra Day is down 2 lbs since her last visit and had done very well overall. She has been off schedule. She struggles with snacking.  Subjective:   1. Insulin resistance Kendra Day is not on any medications for Insulin resistance.  2. Vitamin D deficiency Kendra Day is taking Vitamin D OTC .   Assessment/Plan:   1. Insulin resistance Kendra Day will reduce sweets and carbohydrates. She will continue her water intake. She will continue to work on weight loss, exercise, and decreasing simple carbohydrates to help decrease the risk of diabetes. Briceyda agreed to follow-up with Kendra Day as directed to closely monitor her progress.   2. Vitamin D deficiency Low Vitamin D level contributes to fatigue and are associated with obesity, breast, and colon cancer. Shweta agrees to continue to take OTC Vitamin D 2,000 IU every week and will follow-up for routine testing of Vitamin D, at least 2-3 times per year to avoid over-replacement.   3. Obesity, current BMI 31.9 Kendra Day is currently in the action stage of change. As such, her goal is to continue with weight loss efforts. She has agreed to the Category 1 Plan.   Kendra Day will be consistent with the plan at least 80%.  Exercise goals:  Kendra Day will continue walking.  Behavioral modification strategies: increasing lean protein intake, decreasing simple carbohydrates, increasing vegetables,  increasing water intake, decreasing eating out, no skipping meals, meal planning and cooking strategies, keeping healthy foods in the home, and planning for success.  Kendra Day has agreed to follow-up with our clinic in 3-4 weeks. She was informed of the importance of frequent follow-up visits to maximize her success with intensive lifestyle modifications for her multiple health conditions.   Objective:   Blood pressure 138/87, pulse 66, temperature 97.9 F (36.6 C), height 5\' 5"  (1.651 m), weight 191 lb (86.6 kg), SpO2 98 %. Body mass index is 31.78 kg/m.  General: Cooperative, alert, well developed, in no acute distress. HEENT: Conjunctivae and lids unremarkable. Cardiovascular: Regular rhythm.  Lungs: Normal work of breathing. Neurologic: No focal deficits.   Lab Results  Component Value Date   CREATININE 1.07 (H) 04/19/2021   BUN 23 04/19/2021   NA 141 04/19/2021   K 5.0 04/19/2021   CL 101 04/19/2021   CO2 23 04/19/2021   Lab Results  Component Value Date   ALT 25 04/19/2021   AST 22 04/19/2021   ALKPHOS 129 (H) 04/19/2021   BILITOT 0.2 04/19/2021   Lab Results  Component Value Date   HGBA1C 5.6 04/19/2021   Lab Results  Component Value Date   INSULIN 5.8 04/19/2021   INSULIN 10.8 10/12/2020   Lab Results  Component Value Date   TSH 0.681 02/08/2014   No results found for: CHOL, HDL, LDLCALC, LDLDIRECT, TRIG, CHOLHDL Lab Results  Component Value Date   VD25OH 79.8 04/19/2021   VD25OH 45.9 10/12/2020   Lab Results  Component Value Date   WBC 7.6 02/21/2014  HGB 14.6 02/21/2014   HCT 41.6 02/21/2014   MCV 85.2 02/21/2014   PLT 218 02/21/2014   No results found for: IRON, TIBC, FERRITIN  Attestation Statements:   Reviewed by clinician on day of visit: allergies, medications, problem list, medical history, surgical history, family history, social history, and previous encounter notes.  Time spent on visit including pre-visit chart review and  post-visit care and charting was 20 minutes.   I, Jackson Latino, RMA, am acting as Energy manager for [employer], DO.   I have reviewed the above documentation for accuracy and completeness, and I agree with the above. Corinna Capra, DO

## 2021-07-04 ENCOUNTER — Encounter (INDEPENDENT_AMBULATORY_CARE_PROVIDER_SITE_OTHER): Payer: Self-pay | Admitting: Bariatrics

## 2021-07-27 ENCOUNTER — Other Ambulatory Visit: Payer: Self-pay

## 2021-07-27 ENCOUNTER — Encounter (INDEPENDENT_AMBULATORY_CARE_PROVIDER_SITE_OTHER): Payer: Self-pay | Admitting: Bariatrics

## 2021-07-27 ENCOUNTER — Ambulatory Visit (INDEPENDENT_AMBULATORY_CARE_PROVIDER_SITE_OTHER): Payer: 59 | Admitting: Bariatrics

## 2021-07-27 VITALS — BP 110/70 | HR 62 | Temp 97.8°F | Ht 65.0 in | Wt 188.0 lb

## 2021-07-27 DIAGNOSIS — Z6839 Body mass index (BMI) 39.0-39.9, adult: Secondary | ICD-10-CM

## 2021-07-27 DIAGNOSIS — E559 Vitamin D deficiency, unspecified: Secondary | ICD-10-CM | POA: Diagnosis not present

## 2021-07-27 DIAGNOSIS — E8881 Metabolic syndrome: Secondary | ICD-10-CM | POA: Diagnosis not present

## 2021-07-27 NOTE — Progress Notes (Signed)
Chief Complaint:   OBESITY Kendra Day is here to discuss her progress with her obesity treatment plan along with follow-up of her obesity related diagnoses. Kendra Day is on the Category 1 Plan and states she is following her eating plan approximately 75% of the time. Kendra Day states she is walking 30-45 minutes 6 times per week.  Today's visit was #: 14 Starting weight: 239 lbs Starting date: 10/12/2020 Today's weight: 188 lbs Today's date: 07/27/2021 Total lbs lost to date: 51 lbs Total lbs lost since last in-office visit: 3 lbs  Interim History: Kendra Day is down 3 lbs since her last visit. She is doing well with her protein and water.  Subjective:   1. Insulin resistance Kendra Day is currently not on medications.  2. Vitamin D deficiency Kendra Day is currently taking Vitamin D.  Assessment/Plan:   1. Insulin resistance Kendra Day will continue to work on weight loss, exercise, and decreasing simple carbohydrates to help decrease the risk of diabetes. She will increase proteins. Kendra Day agreed to follow-up with [name] as directed to closely monitor her progress.   2. Vitamin D deficiency Low Vitamin D level contributes to fatigue and are associated with obesity, breast, and colon cancer. Kendra Day agrees to continue to take prescription Vitamin D 50,000 IU every week and she will follow-up for routine testing of Vitamin D, at least 2-3 times per year to avoid over-replacement.   3. Obesity, current BMI 31.3 Kendra Day is currently in the action stage of change. As such, her goal is to continue with weight loss efforts. She has agreed to the Category 1 Plan.   Kendra Day will stay adherent to the plan 80-85%. Handouts given. She will increase protein sources.  Exercise goals:  As is.  She will continue to exercise and walking.  Behavioral modification strategies: increasing lean protein intake, decreasing simple carbohydrates, increasing vegetables, increasing water intake, decreasing  eating out, no skipping meals, meal planning and cooking strategies, keeping healthy foods in the home, and planning for success.  Kendra Day has agreed to follow-up with our clinic in 3-4 weeks (fasting). She was informed of the importance of frequent follow-up visits to maximize her success with intensive lifestyle modifications for her multiple health conditions.   Objective:   Pulse 62, temperature 97.8 F (36.6 C), height 5\' 5"  (1.651 m), weight 188 lb (85.3 kg), SpO2 98 %. Body mass index is 31.28 kg/m.  General: Cooperative, alert, well developed, in no acute distress. HEENT: Conjunctivae and lids unremarkable. Cardiovascular: Regular rhythm.  Lungs: Normal work of breathing. Neurologic: No focal deficits.   Lab Results  Component Value Date   CREATININE 1.07 (H) 04/19/2021   BUN 23 04/19/2021   NA 141 04/19/2021   K 5.0 04/19/2021   CL 101 04/19/2021   CO2 23 04/19/2021   Lab Results  Component Value Date   ALT 25 04/19/2021   AST 22 04/19/2021   ALKPHOS 129 (H) 04/19/2021   BILITOT 0.2 04/19/2021   Lab Results  Component Value Date   HGBA1C 5.6 04/19/2021   Lab Results  Component Value Date   INSULIN 5.8 04/19/2021   INSULIN 10.8 10/12/2020   Lab Results  Component Value Date   TSH 0.681 02/08/2014   No results found for: CHOL, HDL, LDLCALC, LDLDIRECT, TRIG, CHOLHDL Lab Results  Component Value Date   VD25OH 79.8 04/19/2021   VD25OH 45.9 10/12/2020   Lab Results  Component Value Date   WBC 7.6 02/21/2014   HGB 14.6 02/21/2014   HCT 41.6 02/21/2014  MCV 85.2 02/21/2014   PLT 218 02/21/2014   No results found for: IRON, TIBC, FERRITIN  Attestation Statements:   Reviewed by clinician on day of visit: allergies, medications, problem list, medical history, surgical history, family history, social history, and previous encounter notes.  Time spent on visit including pre-visit chart review and post-visit care and charting was 20 minutes.   I, Jackson Latino, RMA, am acting as Energy manager for [employer], DO.   I have reviewed the above documentation for accuracy and completeness, and I agree with the above. Corinna Capra, DO

## 2021-07-28 ENCOUNTER — Encounter (INDEPENDENT_AMBULATORY_CARE_PROVIDER_SITE_OTHER): Payer: Self-pay | Admitting: Bariatrics

## 2021-07-28 ENCOUNTER — Ambulatory Visit (INDEPENDENT_AMBULATORY_CARE_PROVIDER_SITE_OTHER): Payer: 59 | Admitting: Psychology

## 2021-07-28 DIAGNOSIS — F4321 Adjustment disorder with depressed mood: Secondary | ICD-10-CM | POA: Diagnosis not present

## 2021-08-21 ENCOUNTER — Other Ambulatory Visit: Payer: Self-pay

## 2021-08-21 ENCOUNTER — Ambulatory Visit
Admission: RE | Admit: 2021-08-21 | Discharge: 2021-08-21 | Disposition: A | Discharge status: Home / Self Care | Payer: 59 | Source: Ambulatory Visit | Attending: Obstetrics and Gynecology | Admitting: Obstetrics and Gynecology

## 2021-08-21 DIAGNOSIS — Z1231 Encounter for screening mammogram for malignant neoplasm of breast: Secondary | ICD-10-CM

## 2021-08-22 ENCOUNTER — Encounter (INDEPENDENT_AMBULATORY_CARE_PROVIDER_SITE_OTHER): Payer: Self-pay | Admitting: Physician Assistant

## 2021-08-22 ENCOUNTER — Other Ambulatory Visit: Payer: Self-pay

## 2021-08-22 ENCOUNTER — Ambulatory Visit (INDEPENDENT_AMBULATORY_CARE_PROVIDER_SITE_OTHER): Payer: 59 | Admitting: Physician Assistant

## 2021-08-22 VITALS — BP 95/61 | HR 57 | Temp 97.7°F | Ht 65.0 in | Wt 187.0 lb

## 2021-08-22 DIAGNOSIS — E8881 Metabolic syndrome: Secondary | ICD-10-CM

## 2021-08-22 DIAGNOSIS — Z6839 Body mass index (BMI) 39.0-39.9, adult: Secondary | ICD-10-CM | POA: Diagnosis not present

## 2021-08-22 DIAGNOSIS — Z9189 Other specified personal risk factors, not elsewhere classified: Secondary | ICD-10-CM

## 2021-08-22 DIAGNOSIS — E88819 Insulin resistance, unspecified: Secondary | ICD-10-CM

## 2021-08-22 DIAGNOSIS — E559 Vitamin D deficiency, unspecified: Secondary | ICD-10-CM

## 2021-08-22 NOTE — Progress Notes (Signed)
Chief Complaint:   OBESITY Kendra Day is here to discuss her progress with her obesity treatment plan along with follow-up of her obesity related diagnoses. Kendra Day is on the Category 1 Plan and states she is following her eating plan approximately 25% of the time. Kendra Day states she is walking for 20-30 minutes 7 times per week.  Today's visit was #: 15 Starting weight: 239 lbs Starting date: 10/12/2020 Today's weight: 187 lbs Today's date: 08/22/2021 Total lbs lost to date: 52 lbs Total lbs lost since last in-office visit: 1 lb  Interim History: Kendra Day is bored with breakfast and dinner. She is looking for more options. She is slightly hungrier throughout the day. She is no longer weighing her protein. We discussed rechecking her metabolism in the next couple of months if necessary.   Subjective:   1. Insulin resistance Kendra Day is currently not on medications. She notices increased hunger the last few weeks.   2. Vitamin D deficiency Kendra Day is currently on OTC Vitamin D and tolerating it well.  3. At risk for diabetes mellitus Kendra Day is at higher than average risk for developing diabetes due to obesity.     Assessment/Plan:   1. Insulin resistance We will check labs today. Kendra Day will continue to work on weight loss, exercise, and decreasing simple carbohydrates to help decrease the risk of diabetes. Kendra Day agreed to follow-up with [name] as directed to closely monitor her progress.  - Hemoglobin A1c - Insulin, random - Comprehensive metabolic panel  2. Vitamin D deficiency Low Vitamin D level contributes to fatigue and are associated with obesity, breast, and colon cancer. Kendra Day agrees to continue to take OTC Vitamin D 2,000 IU and she will follow-up for routine testing of Vitamin D, at least 2-3 times per year to avoid over-replacement. We will check labs today.   - VITAMIN D 25 Hydroxy (Vit-D Deficiency, Fractures)  3. At risk for diabetes  mellitus Kendra Day was given approximately 15 minutes of diabetes education and counseling today. We discussed intensive lifestyle modifications today with an emphasis on weight loss as well as increasing exercise and decreasing simple carbohydrates in her diet. We also reviewed medication options with an emphasis on risk versus benefit of those discussed.   Repetitive spaced learning was employed today to elicit superior memory formation and behavioral change.   4. Obesity, current BMI 31.12 Kendra Day is currently in the action stage of change. As such, her goal is to continue with weight loss efforts. She has agreed to the Category 1 Plan and keeping a food journal and adhering to recommended goals of 300-400 calories and 30 grams of protein at supper.   Exercise goals:  As is.  Behavioral modification strategies: meal planning and cooking strategies and planning for success.  Kendra Day has agreed to follow-up with our clinic in 3-4 weeks. She was informed of the importance of frequent follow-up visits to maximize her success with intensive lifestyle modifications for her multiple health conditions.   Kendra Day was informed we would discuss her lab results at her next visit unless there is a critical issue that needs to be addressed sooner. Kendra Day agreed to keep her next visit at the agreed upon time to discuss these results.  Objective:   Blood pressure 95/61, pulse (!) 57, temperature 97.7 F (36.5 C), height 5\' 5"  (1.651 m), weight 187 lb (84.8 kg), SpO2 99 %. Body mass index is 31.12 kg/m.  General: Cooperative, alert, well developed, in no acute distress. HEENT: Conjunctivae and lids unremarkable.  Cardiovascular: Regular rhythm.  Lungs: Normal work of breathing. Neurologic: No focal deficits.   Lab Results  Component Value Date   CREATININE 1.07 (H) 04/19/2021   BUN 23 04/19/2021   NA 141 04/19/2021   K 5.0 04/19/2021   CL 101 04/19/2021   CO2 23 04/19/2021   Lab Results   Component Value Date   ALT 25 04/19/2021   AST 22 04/19/2021   ALKPHOS 129 (H) 04/19/2021   BILITOT 0.2 04/19/2021   Lab Results  Component Value Date   HGBA1C 5.6 04/19/2021   Lab Results  Component Value Date   INSULIN 5.8 04/19/2021   INSULIN 10.8 10/12/2020   Lab Results  Component Value Date   TSH 0.681 02/08/2014   No results found for: CHOL, HDL, LDLCALC, LDLDIRECT, TRIG, CHOLHDL Lab Results  Component Value Date   VD25OH 79.8 04/19/2021   VD25OH 45.9 10/12/2020   Lab Results  Component Value Date   WBC 7.6 02/21/2014   HGB 14.6 02/21/2014   HCT 41.6 02/21/2014   MCV 85.2 02/21/2014   PLT 218 02/21/2014   No results found for: IRON, TIBC, FERRITIN  Attestation Statements:   Reviewed by clinician on day of visit: allergies, medications, problem list, medical history, surgical history, family history, social history, and previous encounter notes.  I, Sindy Messing, am acting as Energy manager for [employer], PA-C.   I have reviewed the above documentation for accuracy and completeness, and I agree with the above. Alois Cliche, PA-C

## 2021-08-23 LAB — COMPREHENSIVE METABOLIC PANEL
ALT: 19 IU/L (ref 0–32)
AST: 15 IU/L (ref 0–40)
Albumin/Globulin Ratio: 1.7 (ref 1.2–2.2)
Albumin: 4.5 g/dL (ref 3.8–4.9)
Alkaline Phosphatase: 114 IU/L (ref 44–121)
BUN/Creatinine Ratio: 22 (ref 9–23)
BUN: 23 mg/dL (ref 6–24)
Bilirubin Total: 0.4 mg/dL (ref 0.0–1.2)
CO2: 23 mmol/L (ref 20–29)
Calcium: 10.2 mg/dL (ref 8.7–10.2)
Chloride: 101 mmol/L (ref 96–106)
Creatinine, Ser: 1.05 mg/dL — ABNORMAL HIGH (ref 0.57–1.00)
Globulin, Total: 2.7 g/dL (ref 1.5–4.5)
Glucose: 95 mg/dL (ref 65–99)
Potassium: 4.9 mmol/L (ref 3.5–5.2)
Sodium: 142 mmol/L (ref 134–144)
Total Protein: 7.2 g/dL (ref 6.0–8.5)
eGFR: 64 mL/min/{1.73_m2} (ref >59)

## 2021-08-23 LAB — HEMOGLOBIN A1C
Est. average glucose Bld gHb Est-mCnc: 117 mg/dL
Hgb A1c MFr Bld: 5.7 % — ABNORMAL HIGH (ref 4.8–5.6)

## 2021-08-23 LAB — VITAMIN D 25 HYDROXY (VIT D DEFICIENCY, FRACTURES): Vit D, 25-Hydroxy: 100 ng/mL (ref 30.0–100.0)

## 2021-08-23 LAB — INSULIN, RANDOM: INSULIN: 6.4 u[IU]/mL (ref 2.6–24.9)

## 2021-08-25 ENCOUNTER — Ambulatory Visit (INDEPENDENT_AMBULATORY_CARE_PROVIDER_SITE_OTHER): Payer: 59 | Admitting: Psychology

## 2021-08-25 DIAGNOSIS — F4321 Adjustment disorder with depressed mood: Secondary | ICD-10-CM

## 2021-09-19 ENCOUNTER — Ambulatory Visit (INDEPENDENT_AMBULATORY_CARE_PROVIDER_SITE_OTHER): Payer: 59 | Admitting: Physician Assistant

## 2021-09-29 ENCOUNTER — Ambulatory Visit (INDEPENDENT_AMBULATORY_CARE_PROVIDER_SITE_OTHER): Payer: 59 | Admitting: Psychology

## 2021-09-29 DIAGNOSIS — F4321 Adjustment disorder with depressed mood: Secondary | ICD-10-CM | POA: Diagnosis not present

## 2021-10-02 ENCOUNTER — Ambulatory Visit (INDEPENDENT_AMBULATORY_CARE_PROVIDER_SITE_OTHER): Payer: 59 | Admitting: Physician Assistant

## 2021-10-02 ENCOUNTER — Encounter (INDEPENDENT_AMBULATORY_CARE_PROVIDER_SITE_OTHER): Payer: Self-pay | Admitting: Physician Assistant

## 2021-10-02 ENCOUNTER — Other Ambulatory Visit: Payer: Self-pay

## 2021-10-02 VITALS — BP 116/76 | HR 60 | Temp 97.6°F | Ht 65.0 in | Wt 187.0 lb

## 2021-10-02 DIAGNOSIS — R7303 Prediabetes: Secondary | ICD-10-CM | POA: Diagnosis not present

## 2021-10-02 DIAGNOSIS — E559 Vitamin D deficiency, unspecified: Secondary | ICD-10-CM

## 2021-10-02 DIAGNOSIS — Z6839 Body mass index (BMI) 39.0-39.9, adult: Secondary | ICD-10-CM | POA: Diagnosis not present

## 2021-10-02 DIAGNOSIS — Z9189 Other specified personal risk factors, not elsewhere classified: Secondary | ICD-10-CM | POA: Diagnosis not present

## 2021-10-02 NOTE — Progress Notes (Signed)
Chief Complaint:   OBESITY Kendra Day is here to discuss her progress with her obesity treatment plan along with follow-up of her obesity related diagnoses. Kendra Day is on the Category 1 Plan and states she is following her eating plan approximately 25% of the time. Kendra Day states she is walking 20-30 minutes 7 times per week.  Today's visit was #: 16 Starting weight: 239 lbs Starting date: 10/12/2020 Today's weight: 187 lbs Today's date: 10/02/2021 Total lbs lost to date: 52 Total lbs lost since last in-office visit: 0  Interim History: Kendra Day has had a busy 2 weeks and has been stress snacking. She sometimes doesn't eat all of her protein at night if she snacks too much in the afternoon.  Subjective:   1. Vitamin D deficiency Kendra Day's Vit D level is 100. She is at risk for over supplementation taking OTC Vit D 2,000 IU daily.  2. Pre-diabetes Pt's last A1c was 5.7, which is worse from 5.6. She is not on meds.  3. At risk for diabetes mellitus Kendra Day is at higher than average risk for developing diabetes due to obesity.   Assessment/Plan:   1. Vitamin D deficiency Low Vitamin D level contributes to fatigue and are associated with obesity, breast, and colon cancer. She will discontinue Vit D supplementation and follow-up for routine testing of Vitamin D, at least 2-3 times per year to avoid over-replacement.  2. Pre-diabetes Kendra Day will continue to work on weight loss, exercise, and decreasing simple carbohydrates to help decrease the risk of diabetes.   3. At risk for diabetes mellitus [name] was given approximately 15 minutes of diabetes education and counseling today. We discussed intensive lifestyle modifications today with an emphasis on weight loss as well as increasing exercise and decreasing simple carbohydrates in her diet. We also reviewed medication options with an emphasis on risk versus benefit of those discussed.   Repetitive spaced learning was  employed today to elicit superior memory formation and behavioral change.  4. Obesity, current BMI 31.12  Kendra Day is currently in the action stage of change. As such, her goal is to continue with weight loss efforts. She has agreed to the Category 1 Plan.   Exercise goals:  As is  Behavioral modification strategies: meal planning and cooking strategies and keeping healthy foods in the home.  Kendra Day has agreed to follow-up with our clinic in 3 weeks. She was informed of the importance of frequent follow-up visits to maximize her success with intensive lifestyle modifications for her multiple health conditions.   Objective:   Blood pressure 116/76, pulse 60, temperature 97.6 F (36.4 C), height 5\' 5"  (1.651 m), weight 187 lb (84.8 kg), SpO2 100 %. Body mass index is 31.12 kg/m.  General: Cooperative, alert, well developed, in no acute distress. HEENT: Conjunctivae and lids unremarkable. Cardiovascular: Regular rhythm.  Lungs: Normal work of breathing. Neurologic: No focal deficits.   Lab Results  Component Value Date   CREATININE 1.05 (H) 08/22/2021   BUN 23 08/22/2021   NA 142 08/22/2021   K 4.9 08/22/2021   CL 101 08/22/2021   CO2 23 08/22/2021   Lab Results  Component Value Date   ALT 19 08/22/2021   AST 15 08/22/2021   ALKPHOS 114 08/22/2021   BILITOT 0.4 08/22/2021   Lab Results  Component Value Date   HGBA1C 5.7 (H) 08/22/2021   HGBA1C 5.6 04/19/2021   Lab Results  Component Value Date   INSULIN 6.4 08/22/2021   INSULIN 5.8 04/19/2021  INSULIN 10.8 10/12/2020   Lab Results  Component Value Date   TSH 0.681 02/08/2014   No results found for: CHOL, HDL, LDLCALC, LDLDIRECT, TRIG, CHOLHDL Lab Results  Component Value Date   VD25OH 100.0 08/22/2021   VD25OH 79.8 04/19/2021   VD25OH 45.9 10/12/2020   Lab Results  Component Value Date   WBC 7.6 02/21/2014   HGB 14.6 02/21/2014   HCT 41.6 02/21/2014   MCV 85.2 02/21/2014   PLT 218 02/21/2014     Attestation Statements:   Reviewed by clinician on day of visit: allergies, medications, problem list, medical history, surgical history, family history, social history, and previous encounter notes.  Coral Ceo, CMA, am acting as transcriptionist for [employer], PA-C.  I have reviewed the above documentation for accuracy and completeness, and I agree with the above. Abby Potash, PA-C

## 2021-10-24 ENCOUNTER — Other Ambulatory Visit: Payer: Self-pay

## 2021-10-24 ENCOUNTER — Encounter (INDEPENDENT_AMBULATORY_CARE_PROVIDER_SITE_OTHER): Payer: Self-pay | Admitting: Family Medicine

## 2021-10-24 ENCOUNTER — Ambulatory Visit (INDEPENDENT_AMBULATORY_CARE_PROVIDER_SITE_OTHER): Payer: 59 | Admitting: Family Medicine

## 2021-10-24 VITALS — BP 105/67 | HR 65 | Temp 97.9°F | Ht 65.0 in | Wt 186.0 lb

## 2021-10-24 DIAGNOSIS — R7303 Prediabetes: Secondary | ICD-10-CM

## 2021-10-24 DIAGNOSIS — E559 Vitamin D deficiency, unspecified: Secondary | ICD-10-CM

## 2021-10-24 DIAGNOSIS — Z6839 Body mass index (BMI) 39.0-39.9, adult: Secondary | ICD-10-CM

## 2021-10-24 DIAGNOSIS — E66812 Obesity, class 2: Secondary | ICD-10-CM

## 2021-10-24 NOTE — Progress Notes (Signed)
Chief Complaint:   OBESITY Kendra Day is here to discuss her progress with her obesity treatment plan along with follow-up of her obesity related diagnoses. Kendra Day is on the Category 1 Plan and states she is following her eating plan approximately 60% of the time. Kendra Day states she is walking 30 minutes 3 times per week.  Today's visit was #: 17 Starting weight: 239 lbs Starting date: 10/12/2020 Today's weight: 186 lbs Today's date: 10/24/2021 Total lbs lost to date: 53 Total lbs lost since last in-office visit: 1  Interim History: Kendra Day feels like she has hit a wall with food plan due to her busy schedule. She is going to her sister's for Thanksgiving and doesn't anticipate being overindulgent. She reports some hunger in the morning but is flipping lunch and supper, in anticipation for this. Pt just celebrated her [facility] at [facility].  Subjective:   1. Pre-diabetes Kendra Day's last A1c was 5.7 and insulin level 6.4. She is not on medication.  2. Vitamin D deficiency Pt is not on prescription Vit D, as er last level was at goal.  Assessment/Plan:   1. Pre-diabetes Kendra Day will continue to work on weight loss, exercise, and decreasing simple carbohydrates to help decrease the risk of diabetes. Check labs in February.  2. Vitamin D deficiency Repeat labs in February. Pt will follow-up for routine testing of Vitamin D, at least 2-3 times per year to avoid over-replacement.  3. Obesity BMI today is 28  Kendra Day is currently in the action stage of change. As such, her goal is to continue with weight loss efforts. She has agreed to the Category 1 Plan.   Exercise goals:  As is  Behavioral modification strategies: increasing lean protein intake, meal planning and cooking strategies, keeping healthy foods in the home, and planning for success.  Kendra Day has agreed to follow-up with our clinic in 4 weeks. She was informed of the importance of frequent follow-up visits to  maximize her success with intensive lifestyle modifications for her multiple health conditions.   Objective:   Blood pressure 105/67, pulse 65, temperature 97.9 F (36.6 C), height 5\' 5"  (1.651 m), weight 186 lb (84.4 kg), SpO2 100 %. Body mass index is 30.95 kg/m.  General: Cooperative, alert, well developed, in no acute distress. HEENT: Conjunctivae and lids unremarkable. Cardiovascular: Regular rhythm.  Lungs: Normal work of breathing. Neurologic: No focal deficits.   Lab Results  Component Value Date   CREATININE 1.05 (H) 08/22/2021   BUN 23 08/22/2021   NA 142 08/22/2021   K 4.9 08/22/2021   CL 101 08/22/2021   CO2 23 08/22/2021   Lab Results  Component Value Date   ALT 19 08/22/2021   AST 15 08/22/2021   ALKPHOS 114 08/22/2021   BILITOT 0.4 08/22/2021   Lab Results  Component Value Date   HGBA1C 5.7 (H) 08/22/2021   HGBA1C 5.6 04/19/2021   Lab Results  Component Value Date   INSULIN 6.4 08/22/2021   INSULIN 5.8 04/19/2021   INSULIN 10.8 10/12/2020   Lab Results  Component Value Date   TSH 0.681 02/08/2014   No results found for: CHOL, HDL, LDLCALC, LDLDIRECT, TRIG, CHOLHDL Lab Results  Component Value Date   VD25OH 100.0 08/22/2021   VD25OH 79.8 04/19/2021   VD25OH 45.9 10/12/2020   Lab Results  Component Value Date   WBC 7.6 02/21/2014   HGB 14.6 02/21/2014   HCT 41.6 02/21/2014   MCV 85.2 02/21/2014   PLT 218 02/21/2014  Attestation Statements:   Reviewed by clinician on day of visit: allergies, medications, problem list, medical history, surgical history, family history, social history, and previous encounter notes.  Edmund Hilda, CMA, am acting as transcriptionist for Reuben Likes, MD.   I have reviewed the above documentation for accuracy and completeness, and I agree with the above. - Reuben Likes, MD

## 2021-10-25 ENCOUNTER — Ambulatory Visit (INDEPENDENT_AMBULATORY_CARE_PROVIDER_SITE_OTHER): Payer: 59 | Admitting: Psychology

## 2021-10-25 DIAGNOSIS — F4321 Adjustment disorder with depressed mood: Secondary | ICD-10-CM | POA: Diagnosis not present

## 2021-11-08 IMAGING — MG DIGITAL SCREENING BILAT W/ TOMO W/ CAD
8 series · 8 of 24 positions shown · non-contrast
Comparison: Previous exam(s).

CLINICAL DATA: Screening.

EXAM:
DIGITAL SCREENING BILATERAL MAMMOGRAM WITH TOMO AND CAD

[R CC synth-2D]
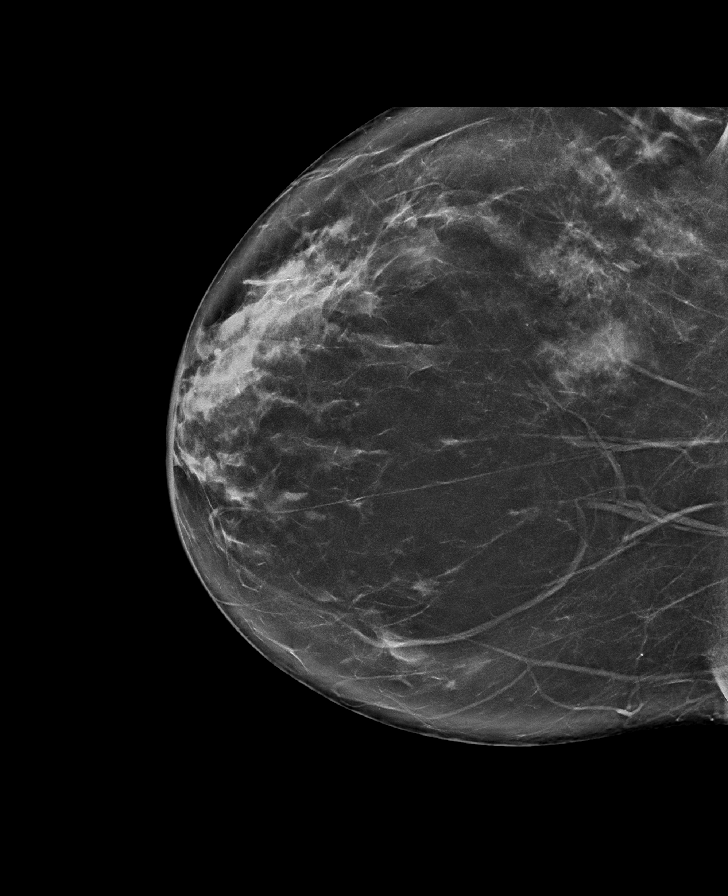

[L MLO synth-2D]
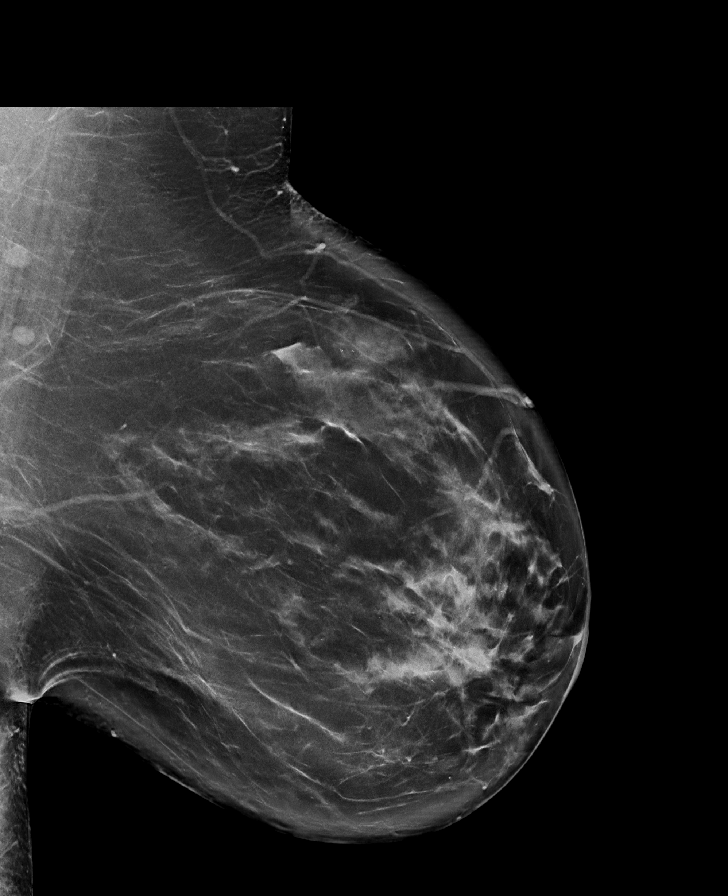

[L CC synth-2D]
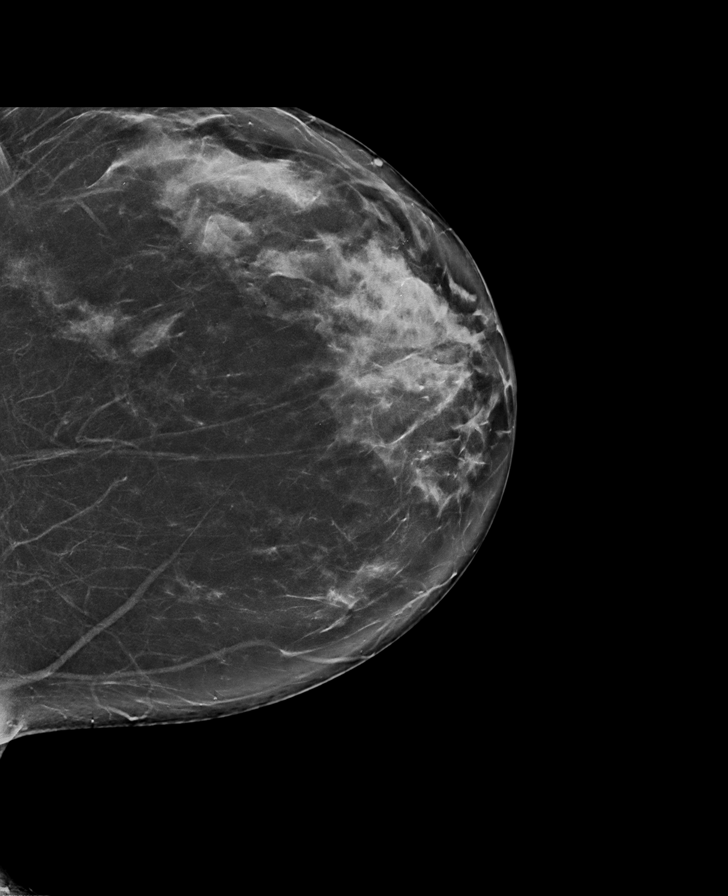

[R MLO synth-2D]
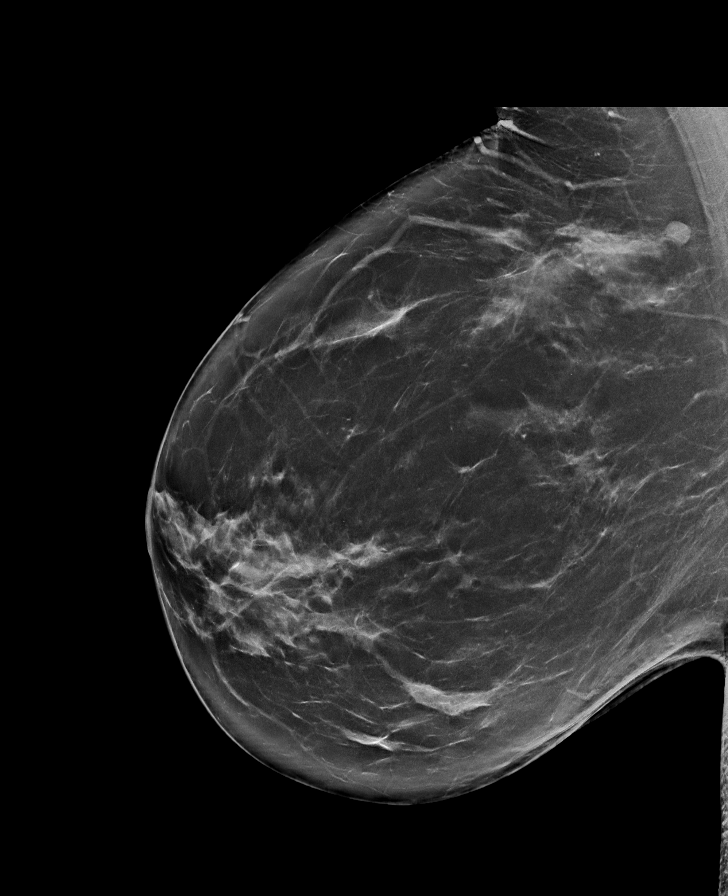

[R CC tomo · tomo slice 42/83.0]
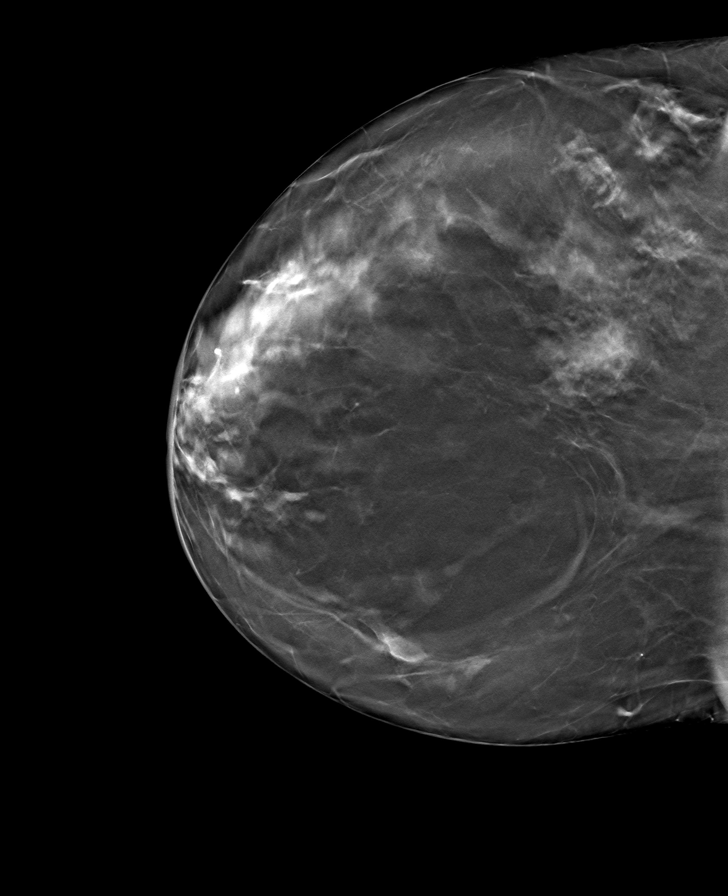

[L CC tomo · tomo slice 43/86.0]
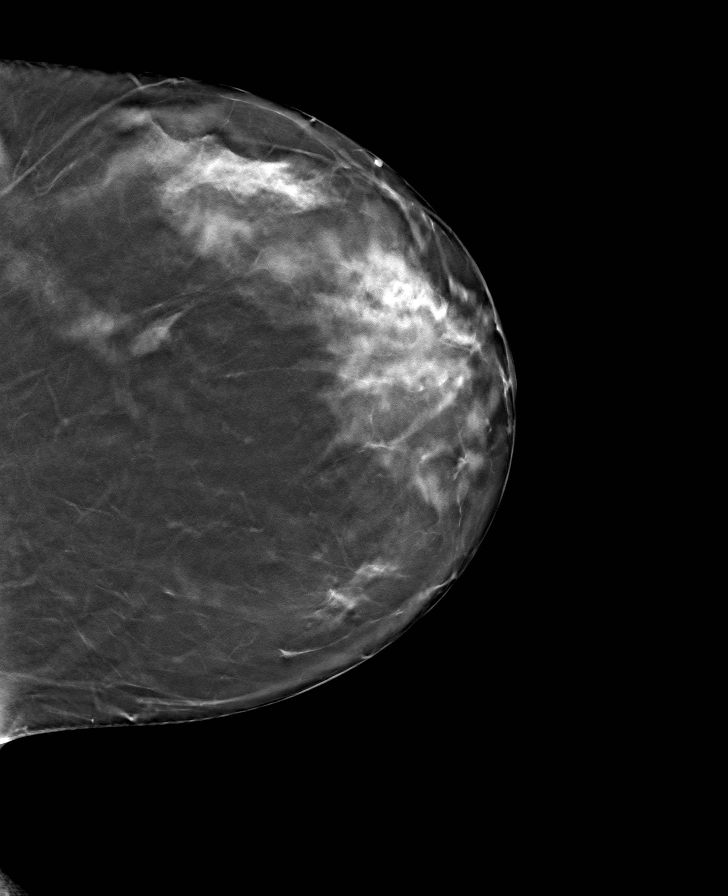

[L MLO tomo · tomo slice 48/95.0]
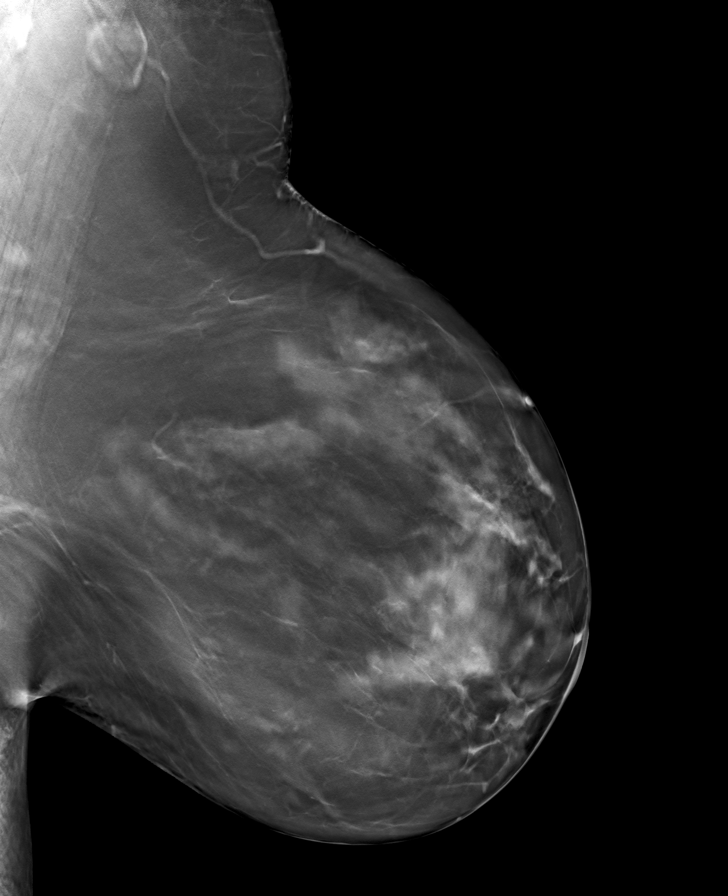

[R MLO tomo · tomo slice 47/93.0]
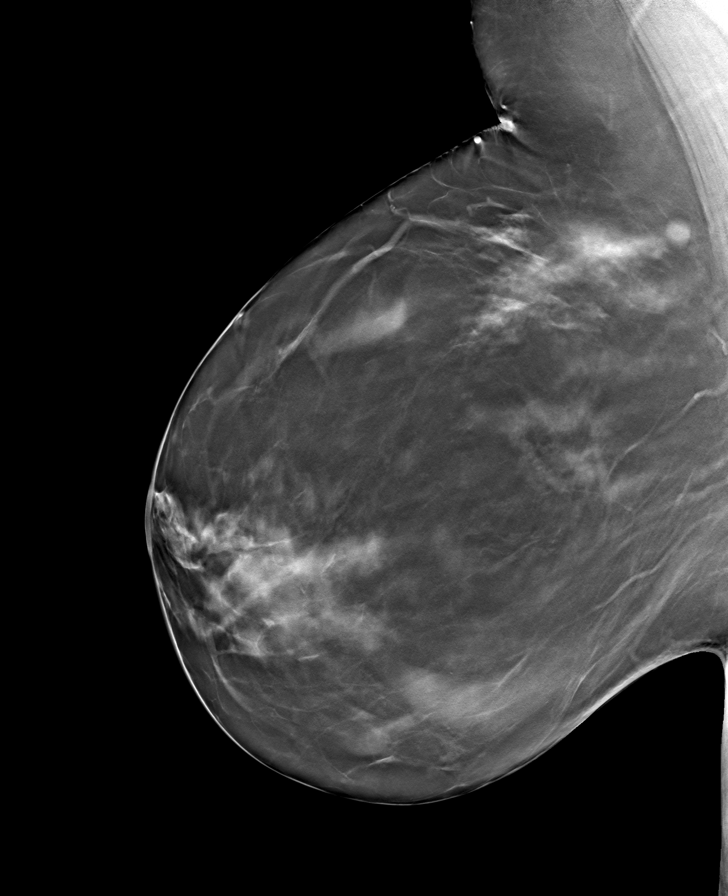

[8 of 24 positions shown; findings below may reference images not displayed]

ACR Breast Density Category b: There are scattered areas of
fibroglandular density.
FINDINGS: There are no findings suspicious for malignancy. Images were
processed with CAD.
IMPRESSION: No mammographic evidence of malignancy. A result letter of this
screening mammogram will be mailed directly to the patient.

RECOMMENDATION:
Screening mammogram in one year. (Code:[id number])

BI-RADS CATEGORY  1: Negative.

## 2021-11-16 ENCOUNTER — Encounter (INDEPENDENT_AMBULATORY_CARE_PROVIDER_SITE_OTHER): Payer: Self-pay | Admitting: Physician Assistant

## 2021-11-16 ENCOUNTER — Ambulatory Visit (INDEPENDENT_AMBULATORY_CARE_PROVIDER_SITE_OTHER): Payer: 59 | Admitting: Physician Assistant

## 2021-11-16 ENCOUNTER — Other Ambulatory Visit: Payer: Self-pay

## 2021-11-16 VITALS — BP 117/85 | HR 62 | Temp 97.7°F | Ht 65.0 in | Wt 186.0 lb

## 2021-11-16 DIAGNOSIS — Z6839 Body mass index (BMI) 39.0-39.9, adult: Secondary | ICD-10-CM

## 2021-11-16 DIAGNOSIS — E559 Vitamin D deficiency, unspecified: Secondary | ICD-10-CM | POA: Diagnosis not present

## 2021-11-16 NOTE — Progress Notes (Signed)
Chief Complaint:   OBESITY Kendra Day is here to discuss her progress with her obesity treatment plan along with follow-up of her obesity related diagnoses. Kendra Day is on the Category 1 Plan and states she is following her eating plan approximately 50% of the time. Kendra Day states she is walking for 30 minutes 4 times per week.  Today's visit was #: 18 Starting weight: 239 lbs Starting date: 10/12/2020 Today's weight: 186 lbs Today's date: 11/16/2021 Total lbs lost to date: 53 lbs Total lbs lost since last in-office visit: 0  Interim History: Kendra Day reports that when she is stressed she has a hard time focusing on plan. Holiday food and treats are all around her and she has been eating these things more often.  Subjective:   1. Vitamin D deficiency Kendra Day is currently not on medication, as her last Vitamin D level was 100.0.  Assessment/Plan:   1. Vitamin D deficiency Low Vitamin D level contributes to fatigue and are associated with obesity, breast, and colon cancer. Kendra Day will continue with plan and we will monitor Vitamin D periodically. She agrees to continue to take prescription Vitamin D 50,000 IU every week and will follow-up for routine testing of Vitamin D, at least 2-3 times per year to avoid over-replacement.  2. Obesity, current BMI 30.95 Kendra Day is currently in the action stage of change. As such, her goal is to continue with weight loss efforts. She has agreed to the Category 1 Plan.   We will recheck Interstitial Cystitis and labs.   Exercise goals:  As is.  Behavioral modification strategies: meal planning and cooking strategies and keeping healthy foods in the home.  Kendra Day has agreed to follow-up with our clinic in 4 weeks. She was informed of the importance of frequent follow-up visits to maximize her success with intensive lifestyle modifications for her multiple health conditions.   Objective:   Blood pressure 117/85, pulse 62, temperature  97.7 F (36.5 C), height 5\' 5"  (1.651 m), weight 186 lb (84.4 kg), SpO2 100 %. Body mass index is 30.95 kg/m.  General: Cooperative, alert, well developed, in no acute distress. HEENT: Conjunctivae and lids unremarkable. Cardiovascular: Regular rhythm.  Lungs: Normal work of breathing. Neurologic: No focal deficits.   Lab Results  Component Value Date   CREATININE 1.05 (H) 08/22/2021   BUN 23 08/22/2021   NA 142 08/22/2021   K 4.9 08/22/2021   CL 101 08/22/2021   CO2 23 08/22/2021   Lab Results  Component Value Date   ALT 19 08/22/2021   AST 15 08/22/2021   ALKPHOS 114 08/22/2021   BILITOT 0.4 08/22/2021   Lab Results  Component Value Date   HGBA1C 5.7 (H) 08/22/2021   HGBA1C 5.6 04/19/2021   Lab Results  Component Value Date   INSULIN 6.4 08/22/2021   INSULIN 5.8 04/19/2021   INSULIN 10.8 10/12/2020   Lab Results  Component Value Date   TSH 0.681 02/08/2014   No results found for: CHOL, HDL, LDLCALC, LDLDIRECT, TRIG, CHOLHDL Lab Results  Component Value Date   VD25OH 100.0 08/22/2021   VD25OH 79.8 04/19/2021   VD25OH 45.9 10/12/2020   Lab Results  Component Value Date   WBC 7.6 02/21/2014   HGB 14.6 02/21/2014   HCT 41.6 02/21/2014   MCV 85.2 02/21/2014   PLT 218 02/21/2014   No results found for: IRON, TIBC, FERRITIN  Attestation Statements:   Reviewed by clinician on day of visit: allergies, medications, problem list, medical history, surgical history,  family history, social history, and previous encounter notes.   Time spent on visit including pre-visit chart review and post-visit care and charting was 30 minutes.   I, Sindy Messing, am acting as Energy manager for [employer], PA-C.   I have reviewed the above documentation for accuracy and completeness, and I agree with the above. Alois Cliche, PA-C

## 2021-11-23 ENCOUNTER — Ambulatory Visit (INDEPENDENT_AMBULATORY_CARE_PROVIDER_SITE_OTHER): Payer: 59 | Admitting: Psychology

## 2021-11-23 DIAGNOSIS — F4321 Adjustment disorder with depressed mood: Secondary | ICD-10-CM

## 2021-11-23 NOTE — Progress Notes (Signed)
[facility] Counselor/Therapist Progress Note  Patient ID: Kendra Day, MRN: [id number],    Date: 11/23/2021  Time Spent: 50 mins  Treatment Type: Individual Therapy  Reported Symptoms: Pt presented for follow up session, via webex video, due to virus outbreak.  Pt grants consent for session, stating she is in her home with no one else present.  I shared with pt that I am in my office at home with no one else present here either.  Pt shares she has "had a crazy 4 wks since our last visit.  Work was really busy and it was hard without my boss there."    Mental Status Exam: Appearance:  Casual     Behavior: Appropriate  Motor: Normal  Speech/Language:  Clear and Coherent  Affect: Appropriate  Mood: normal  Thought process: normal  Thought content:   WNL  Sensory/Perceptual disturbances:   WNL  Orientation: oriented to person, place, and time/date  Attention: Good  Concentration: Good  Memory: WNL  Fund of knowledge:  Good  Insight:   Good  Judgment:  Good  Impulse Control: Good   Risk Assessment: Danger to Self:  No Self-injurious Behavior: No Danger to Others: No Duty to Warn:no Physical Aggression / Violence:No  Access to Firearms a concern: No  Gang Involvement:No   Subjective: Pt shares that [name] has been home since the 5th or 6th of the month.  He has been pretty good.  He only passed one credit hour this past semester.  They are trying to help him decide what is best for him moving forward.  They will allow him to go back to [employer] for the next semester and they will decide what to do at the end of the semester.  Talked about a variety of options for him to earn a living that does not require a college degree.  Pt describes an incident where [name] started an argument with [name] about how [name] was lazy.  Pt and [name] were standing up for [place] because [name]'s points were not valid.  [name] got mad and left the home only to come back later and did not  pursue the issue any more.  Pt shares that [name] has been accepted to [employer], [employer], and [employer].  He will not here from his favorite schools until Feb/Mar.  He continues to take college classes (made 3 A's and one B).  Pt shares that she is listening to an audio book and reading a book and walking the dog with [name].  They visited friends in [place] last weekend and that was fun for them.  They are going to several concerts in the [place].  They will also get together with family for the holidays as well.  Pt shares that she has seen her boss a couple of times recently and he is doing much better.  He is likely to be coming back to work early in Jan; pt is hopeful that he will catch on and be able to do what he needs to do to be successful in his return to work.  Encouraged pt to continue with her self care activities and we will meet in 4 wks for a follow up session.   Interventions: Cognitive Behavioral Therapy  Diagnosis:Adjustment disorder with depressed mood  Plan: Treatment Plan Strengths/Abilities:  Intelligent, Intuitive, Willing to participate in therapy Treatment Preferences:  Outpatient Individual Therapy Statement of Needs:  Patient is to use CBT, mindfulness and coping skills to help manage and/or decrease  symptoms associated with their diagnosis. Symptoms:  Depressed/Irritable mood, worry, social withdrawal Problems Addressed:  Depressive thoughts, Sadness, Sleep issues, etc. Long Term Goals:  Pt to reduce overall level, frequency, and intensity of the feelings of depression as evidenced by decreased irritability, negative self talk, and helpless feelings from 6 to 7 days/week to 0 to 1 days/week, per client report, for at least 3 consecutive months.  Progress: 30% Short Term Goals:  Pt to verbally express understanding of the relationship between feelings of depression and their impact on thinking patterns and behaviors.  Pt to verbalize an understanding of the role that distorted thinking  plays in creating fears, excessive worry, and ruminations.  Progress: 30% Target Date:  11/23/2022 Frequency:  Monthly Modality:  Cognitive Behavioral Therapy Interventions by Therapist:  Therapist will use CBT, Mindfulness exercises, Coping skills and Referrals, as needed by client. Client has verbally approved this treatment plan.  Karie Kirks, [facility]

## 2021-12-15 ENCOUNTER — Ambulatory Visit: Payer: Self-pay | Admitting: Internal Medicine

## 2021-12-18 ENCOUNTER — Other Ambulatory Visit: Payer: Self-pay

## 2021-12-18 ENCOUNTER — Encounter (INDEPENDENT_AMBULATORY_CARE_PROVIDER_SITE_OTHER): Payer: Self-pay | Admitting: Physician Assistant

## 2021-12-18 ENCOUNTER — Ambulatory Visit (INDEPENDENT_AMBULATORY_CARE_PROVIDER_SITE_OTHER): Payer: 59 | Admitting: Physician Assistant

## 2021-12-18 VITALS — BP 132/75 | HR 67 | Temp 97.6°F | Ht 65.0 in | Wt 186.0 lb

## 2021-12-18 DIAGNOSIS — R0602 Shortness of breath: Secondary | ICD-10-CM | POA: Diagnosis not present

## 2021-12-18 DIAGNOSIS — R7303 Prediabetes: Secondary | ICD-10-CM | POA: Diagnosis not present

## 2021-12-18 DIAGNOSIS — E669 Obesity, unspecified: Secondary | ICD-10-CM | POA: Diagnosis not present

## 2021-12-18 DIAGNOSIS — Z6839 Body mass index (BMI) 39.0-39.9, adult: Secondary | ICD-10-CM

## 2021-12-18 DIAGNOSIS — E559 Vitamin D deficiency, unspecified: Secondary | ICD-10-CM

## 2021-12-18 DIAGNOSIS — Z9189 Other specified personal risk factors, not elsewhere classified: Secondary | ICD-10-CM

## 2021-12-18 DIAGNOSIS — E7849 Other hyperlipidemia: Secondary | ICD-10-CM

## 2021-12-18 DIAGNOSIS — Z6830 Body mass index (BMI) 30.0-30.9, adult: Secondary | ICD-10-CM

## 2021-12-18 NOTE — Progress Notes (Signed)
Chief Complaint:   OBESITY Kendra Day is here to discuss her progress with her obesity treatment plan along with follow-up of her obesity related diagnoses. Kendra Day is on the Category 1 Plan and states she is following her eating plan approximately 50% of the time. Kendra Day states she is walking and doing resistance for 30 minutes 3 times per week.  Today's visit was #: 83 Starting weight: 239 lbs Starting date: 10/12/2020 Today's weight: 186 lbs Today's date: 12/18/2021 Total lbs lost to date: 53 Total lbs lost since last in-office visit: 0  Interim History: Kendra Day's IC today shows increase in RMR to 1843 from 1414. She is hungry all day long. She is drinking more water to decrease her hunger.  Subjective:   1. Shortness of breath Kendra Day denies dizziness or lightheadedness.   2. Other hyperlipidemia Kendra Day is not on medications and she denies chest pain.  3. Pre-diabetes Kendra Day's last A1c was 5.7. She reports polyphagia and she is not on medications.  4. Vitamin D deficiency Kendra Day's last vit D level was 100. She is not on supplementation currently.  5. At risk for heart disease Kendra Day is at a higher than average risk for cardiovascular disease due to obesity.   Assessment/Plan:   1. Shortness of breath IC was done today. Kendra Day has agreed to work on weight loss and gradually increase exercise to treat her exercise induced shortness of breath. Will continue to monitor closely.  2. Other hyperlipidemia Cardiovascular risk and specific lipid/LDL goals reviewed. We discussed several lifestyle modifications today. We will check labs today. Kendra Day will continue with exercise and her meal plan. Orders and follow up as documented in patient record.   Counseling Intensive lifestyle modifications are the first line treatment for this issue. Dietary changes: Increase soluble fiber. Decrease simple carbohydrates. Exercise changes: Moderate to vigorous-intensity  aerobic activity 150 minutes per week if tolerated. Lipid-lowering medications: see documented in medical record.  - Lipid panel  3. Pre-diabetes We will check labs today. Kendra Day will continue with exercise and her meal plan to help decrease the risk of diabetes.   - Comprehensive metabolic panel - Hemoglobin A1c - Insulin, random  4. Vitamin D deficiency We will check labs today. Kendra Day will follow-up for routine testing of Vitamin D, at least 2-3 times per year to avoid over-replacement.  - VITAMIN D 25 Hydroxy (Vit-D Deficiency, Fractures)  5. At risk for heart disease Kendra Day was given approximately 15 minutes of coronary artery disease prevention counseling today. She is 51 y.o. female and has risk factors for heart disease including obesity. We discussed intensive lifestyle modifications today with an emphasis on specific weight loss instructions and strategies.   Repetitive spaced learning was employed today to elicit superior memory formation and behavioral change.  6. Obesity, current BMI 30.95 Kendra Day is currently in the action stage of change. As such, her goal is to continue with weight loss efforts. She has agreed to change to the Category 2 Plan + 150 protein calories..   Exercise goals: Walking and resistance training 3 times per week, and resistance band exercise were given.  Behavioral modification strategies: increasing lean protein intake and meal planning and cooking strategies.  Kendra Day has agreed to follow-up with our clinic in 3 to 4 weeks. She was informed of the importance of frequent follow-up visits to maximize her success with intensive lifestyle modifications for her multiple health conditions.   Kendra Day was informed we would discuss her lab results at her next visit unless there  is a critical issue that needs to be addressed sooner. Kendra Day agreed to keep her next visit at the agreed upon time to discuss these results.  Objective:   Blood  pressure 132/75, pulse 67, temperature 97.6 F (36.4 C), height 5\' 5"  (1.651 m), weight 186 lb (84.4 kg), SpO2 100 %. Body mass index is 30.95 kg/m.  General: Cooperative, alert, well developed, in no acute distress. HEENT: Conjunctivae and lids unremarkable. Cardiovascular: Regular rhythm.  Lungs: Normal work of breathing. Neurologic: No focal deficits.   Lab Results  Component Value Date   CREATININE 1.05 (H) 08/22/2021   BUN 23 08/22/2021   NA 142 08/22/2021   K 4.9 08/22/2021   CL 101 08/22/2021   CO2 23 08/22/2021   Lab Results  Component Value Date   ALT 19 08/22/2021   AST 15 08/22/2021   ALKPHOS 114 08/22/2021   BILITOT 0.4 08/22/2021   Lab Results  Component Value Date   HGBA1C 5.7 (H) 08/22/2021   HGBA1C 5.6 04/19/2021   Lab Results  Component Value Date   INSULIN 6.4 08/22/2021   INSULIN 5.8 04/19/2021   INSULIN 10.8 10/12/2020   Lab Results  Component Value Date   TSH 0.681 02/08/2014   No results found for: CHOL, HDL, LDLCALC, LDLDIRECT, TRIG, CHOLHDL Lab Results  Component Value Date   VD25OH 100.0 08/22/2021   VD25OH 79.8 04/19/2021   VD25OH 45.9 10/12/2020   Lab Results  Component Value Date   WBC 7.6 02/21/2014   HGB 14.6 02/21/2014   HCT 41.6 02/21/2014   MCV 85.2 02/21/2014   PLT 218 02/21/2014   No results found for: IRON, TIBC, FERRITIN  Attestation Statements:   Reviewed by clinician on day of visit: allergies, medications, problem list, medical history, surgical history, family history, social history, and previous encounter notes.   Wilhemena Durie, am acting as transcriptionist for [employer], PA-C.  I have reviewed the above documentation for accuracy and completeness, and I agree with the above. Abby Potash, PA-C

## 2021-12-19 LAB — COMPREHENSIVE METABOLIC PANEL
ALT: 16 IU/L (ref 0–32)
AST: 17 IU/L (ref 0–40)
Albumin/Globulin Ratio: 1.8 (ref 1.2–2.2)
Albumin: 4.5 g/dL (ref 3.8–4.9)
Alkaline Phosphatase: 115 IU/L (ref 44–121)
BUN/Creatinine Ratio: 26 — ABNORMAL HIGH (ref 9–23)
BUN: 26 mg/dL — ABNORMAL HIGH (ref 6–24)
Bilirubin Total: 0.3 mg/dL (ref 0.0–1.2)
CO2: 24 mmol/L (ref 20–29)
Calcium: 9.7 mg/dL (ref 8.7–10.2)
Chloride: 105 mmol/L (ref 96–106)
Creatinine, Ser: 1.01 mg/dL — ABNORMAL HIGH (ref 0.57–1.00)
Globulin, Total: 2.5 g/dL (ref 1.5–4.5)
Glucose: 98 mg/dL (ref 70–99)
Potassium: 5 mmol/L (ref 3.5–5.2)
Sodium: 143 mmol/L (ref 134–144)
Total Protein: 7 g/dL (ref 6.0–8.5)
eGFR: 67 mL/min/{1.73_m2} (ref >59)

## 2021-12-19 LAB — INSULIN, RANDOM: INSULIN: 8.3 u[IU]/mL (ref 2.6–24.9)

## 2021-12-19 LAB — LIPID PANEL
Chol/HDL Ratio: 2.6 ratio (ref 0.0–4.4)
Cholesterol, Total: 179 mg/dL (ref 100–199)
HDL: 68 mg/dL (ref >39)
LDL Chol Calc (NIH): 95 mg/dL (ref 0–99)
Triglycerides: 86 mg/dL (ref 0–149)
VLDL Cholesterol Cal: 16 mg/dL (ref 5–40)

## 2021-12-19 LAB — HEMOGLOBIN A1C
Est. average glucose Bld gHb Est-mCnc: 117 mg/dL
Hgb A1c MFr Bld: 5.7 % — ABNORMAL HIGH (ref 4.8–5.6)

## 2021-12-19 LAB — VITAMIN D 25 HYDROXY (VIT D DEFICIENCY, FRACTURES): Vit D, 25-Hydroxy: 85.3 ng/mL (ref 30.0–100.0)

## 2021-12-21 ENCOUNTER — Ambulatory Visit (INDEPENDENT_AMBULATORY_CARE_PROVIDER_SITE_OTHER): Payer: 59 | Admitting: Psychology

## 2021-12-21 DIAGNOSIS — F4321 Adjustment disorder with depressed mood: Secondary | ICD-10-CM | POA: Diagnosis not present

## 2021-12-21 NOTE — Progress Notes (Signed)
[place] Counselor/Therapist Progress Note  Patient ID: Kendra Day, MRN: [id number],    Date: 12/21/2021  Time Spent: 50 mins  Treatment Type: Individual Therapy  Reported Symptoms: Pt presented for follow up session, via webex video, due to virus outbreak.  Pt grants consent for session, stating she is in her home with no one else present.  I shared with pt that I am in my office at home with no one else present here either.  Pt shares her boss did come back to work this past Tuesday, "and it has not been great.  He was cleared to come back one hour per day but he is there more than an hour a day."  Pt shares the boss is sending her emails and is saying "I don't know how to do anything, my brain is jumbled", etc.    Mental Status Exam: Appearance:  Casual     Behavior: Appropriate  Motor: Normal  Speech/Language:  Clear and Coherent  Affect: Appropriate  Mood: normal  Thought process: normal  Thought content:   WNL  Sensory/Perceptual disturbances:   WNL  Orientation: oriented to person, place, and time/date  Attention: Good  Concentration: Good  Memory: WNL  Fund of knowledge:  Good  Insight:   Good  Judgment:  Good  Impulse Control: Good   Risk Assessment: Danger to Self:  No Self-injurious Behavior: No Danger to Others: No Duty to Warn:no Physical Aggression / Violence:No  Access to Firearms a concern: No  Gang Involvement:No   Subjective: Pt shares "I don't know how to help in this situation."  Pt does not want to call HR because she does not want to open any windows for HR to be engaging in their office more than they are now.  Pt will be going to the new company's office in [place] 2 days per week and in their current office the other 3 days per week, beginning in mid-Feb.  Pt is very stressed by this situation and is not sure what her next steps need to be.  Pt and her coworkers have all talked about this situation and no one is sure what to do  about it.  Pt shares that her family is doing well.  Marcello Moores is doing well in school and is still waiting to hear from [place] [facility] [place].  Ulice Dash is doing a little better and they want to help steer him in directions that are best for him.  She is encouraging him to get a part time job as he is only taking 2 classes this semester.  Pt is excited that Ulice Dash has agreed to go with them on the family vacation this summer and she purchased the plane tickets yesterday for everyone.  Encouraged pt to continue with her self care activities and we will meet in 4 wks for a follow up session.  Interventions: Cognitive Behavioral Therapy  Diagnosis:Adjustment disorder with depressed mood  Plan: Treatment Plan Strengths/Abilities:  Intelligent, Intuitive, Willing to participate in therapy Treatment Preferences:  Outpatient Individual Therapy Statement of Needs:  Patient is to use CBT, mindfulness and coping skills to help manage and/or decrease symptoms associated with their diagnosis. Symptoms:  Depressed/Irritable mood, worry, social withdrawal Problems Addressed:  Depressive thoughts, Sadness, Sleep issues, etc. Long Term Goals:  Pt to reduce overall level, frequency, and intensity of the feelings of depression as evidenced by decreased irritability, negative self talk, and helpless feelings from 6 to 7 days/week to 0 to 1 days/week,  per client report, for at least 3 consecutive months.  Progress: 30% Short Term Goals:  Pt to verbally express understanding of the relationship between feelings of depression and their impact on thinking patterns and behaviors.  Pt to verbalize an understanding of the role that distorted thinking plays in creating fears, excessive worry, and ruminations.  Progress: 30% Target Date:  11/23/2022 Frequency:  Monthly Modality:  Cognitive Behavioral Therapy Interventions by Therapist:  Therapist will use CBT, Mindfulness exercises, Coping skills and Referrals, as needed by client. Client  has verbally approved this treatment plan.  Ivan Anchors, [place], [facility]

## 2022-01-10 ENCOUNTER — Ambulatory Visit (INDEPENDENT_AMBULATORY_CARE_PROVIDER_SITE_OTHER): Payer: 59 | Admitting: Physician Assistant

## 2022-01-10 ENCOUNTER — Encounter (INDEPENDENT_AMBULATORY_CARE_PROVIDER_SITE_OTHER): Payer: Self-pay | Admitting: Physician Assistant

## 2022-01-10 ENCOUNTER — Other Ambulatory Visit: Payer: Self-pay

## 2022-01-10 VITALS — BP 110/72 | HR 63 | Temp 97.9°F | Ht 65.0 in | Wt 183.0 lb

## 2022-01-10 DIAGNOSIS — R7303 Prediabetes: Secondary | ICD-10-CM | POA: Diagnosis not present

## 2022-01-10 DIAGNOSIS — E559 Vitamin D deficiency, unspecified: Secondary | ICD-10-CM

## 2022-01-10 DIAGNOSIS — E669 Obesity, unspecified: Secondary | ICD-10-CM

## 2022-01-10 DIAGNOSIS — Z6830 Body mass index (BMI) 30.0-30.9, adult: Secondary | ICD-10-CM | POA: Diagnosis not present

## 2022-01-10 DIAGNOSIS — Z9189 Other specified personal risk factors, not elsewhere classified: Secondary | ICD-10-CM

## 2022-01-10 NOTE — Progress Notes (Signed)
Chief Complaint:   OBESITY Kendra Day is here to discuss her progress with her obesity treatment plan along with follow-up of her obesity related diagnoses. Kendra Day is on the Category 2 Plan + 150 protein calories and states she is following her eating plan approximately 75% of the time. Kendra Day states she is walking and doing body weights 30 minutes 4-5 times per week.  Today's visit was #: 20 Starting weight: 239 lbs Starting date: 10/12/2020 Today's weight: 183 lbs Today's date: 01/10/2022 Total lbs lost to date: 56 Total lbs lost since last in-office visit: 3  Interim History: Pt transitioned from category 1 to category 2 after checking her IC last visit. Her hunger is much more controlled.  Subjective:   1. Vitamin D deficiency Pt's last Vit D level was 85.3. She is not taking supplementation.  2. Pre-diabetes Pt's last A1c was 5.7, unchanged from A1c 08/2021. She is not on meds.  3. At risk for diabetes mellitus Kendra Day is at higher than average risk for developing diabetes due to her obesity.  Assessment/Plan:   1. Vitamin D deficiency Continue with plan and weight loss.  2. Pre-diabetes Kendra Day will continue to work on weight loss, exercise, and decreasing simple carbohydrates to help decrease the risk of diabetes. Continue with plan and exercise.  3. At risk for diabetes mellitus Kendra Day was given approximately 15 minutes of diabetic education and counseling today. We discussed intensive lifestyle modifications today with an emphasis on weight loss as well as increasing exercise and decreasing simple carbohydrates in her diet. We also reviewed medication options with an emphasis on risk versus benefits of those discussed.  Repetitive spaced learning was employed today to elicit superior memory formation and behavioral change.  4. Obesity, current BMI 30.45 Kendra Day is currently in the action stage of change. As such, her goal is to continue with weight loss  efforts. She has agreed to the Category 2 Plan +150 protein calories.   Exercise goals:  As is  Behavioral modification strategies: meal planning and cooking strategies and planning for success.  Kendra Day has agreed to follow-up with our clinic in 3 weeks. She was informed of the importance of frequent follow-up visits to maximize her success with intensive lifestyle modifications for her multiple health conditions.   Objective:   Blood pressure 110/72, pulse 63, temperature 97.9 F (36.6 C), height 5\' 5"  (1.651 m), weight 183 lb (83 kg), SpO2 100 %. Body mass index is 30.45 kg/m.  General: Cooperative, alert, well developed, in no acute distress. HEENT: Conjunctivae and lids unremarkable. Cardiovascular: Regular rhythm.  Lungs: Normal work of breathing. Neurologic: No focal deficits.   Lab Results  Component Value Date   CREATININE 1.01 (H) 12/18/2021   BUN 26 (H) 12/18/2021   NA 143 12/18/2021   K 5.0 12/18/2021   CL 105 12/18/2021   CO2 24 12/18/2021   Lab Results  Component Value Date   ALT 16 12/18/2021   AST 17 12/18/2021   ALKPHOS 115 12/18/2021   BILITOT 0.3 12/18/2021   Lab Results  Component Value Date   HGBA1C 5.7 (H) 12/18/2021   HGBA1C 5.7 (H) 08/22/2021   HGBA1C 5.6 04/19/2021   Lab Results  Component Value Date   INSULIN 8.3 12/18/2021   INSULIN 6.4 08/22/2021   INSULIN 5.8 04/19/2021   INSULIN 10.8 10/12/2020   Lab Results  Component Value Date   TSH 0.681 02/08/2014   Lab Results  Component Value Date   CHOL 179 12/18/2021  HDL 68 12/18/2021   LDLCALC 95 12/18/2021   TRIG 86 12/18/2021   CHOLHDL 2.6 12/18/2021   Lab Results  Component Value Date   VD25OH 85.3 12/18/2021   VD25OH 100.0 08/22/2021   VD25OH 79.8 04/19/2021   Lab Results  Component Value Date   WBC 7.6 02/21/2014   HGB 14.6 02/21/2014   HCT 41.6 02/21/2014   MCV 85.2 02/21/2014   PLT 218 02/21/2014    Attestation Statements:   Reviewed by clinician on day of  visit: allergies, medications, problem list, medical history, surgical history, family history, social history, and previous encounter notes.  Coral Ceo, CMA, am acting as transcriptionist for [employer], PA-C.  I have reviewed the above documentation for accuracy and completeness, and I agree with the above. Abby Potash, PA-C

## 2022-01-19 ENCOUNTER — Ambulatory Visit (INDEPENDENT_AMBULATORY_CARE_PROVIDER_SITE_OTHER): Payer: 59 | Admitting: Psychology

## 2022-01-19 DIAGNOSIS — F4321 Adjustment disorder with depressed mood: Secondary | ICD-10-CM | POA: Diagnosis not present

## 2022-01-19 NOTE — Progress Notes (Signed)
[place] Counselor/Therapist Progress Note  Patient ID: EVAGELIA KNACK, MRN: [id number],    Date: 01/19/2022  Time Spent: 50 mins  Treatment Type: Individual Therapy  Reported Symptoms: Pt presented for follow up session, via webex video, due to virus outbreak.  Pt grants consent for session, stating she is in her home with no one else present.  I shared with pt that I am in my office at home with no one else present here either.  Pt shares her boss is still not doing much better at work; pt and several other coworkers talked with him last week and encouraged him to make decisions that are in his best interests.  Mental Status Exam: Appearance:  Casual     Behavior: Appropriate  Motor: Normal  Speech/Language:  Clear and Coherent  Affect: Appropriate  Mood: normal  Thought process: normal  Thought content:   WNL  Sensory/Perceptual disturbances:   WNL  Orientation: oriented to person, place, and time/date  Attention: Good  Concentration: Good  Memory: WNL  Fund of knowledge:  Good  Insight:   Good  Judgment:  Good  Impulse Control: Good   Risk Assessment: Danger to Self:  No Self-injurious Behavior: No Danger to Others: No Duty to Warn:no Physical Aggression / Violence:No  Access to Firearms a concern: No  Gang Involvement:No   Subjective: Pt shares she stills feel at a loss on how to help him and or make the situation better for him.  She mentions that he is "remarkably different physically."  She and her coworkers are planning to move to the new office location in [place].  Pt shares she has met with the boss' wife and believes she understands pt's concerns.  Pt shares that someone from corporate is coming next week to do their annual reviews and pt is planning to talk with them about how much the boss is still struggling.  Pt has realized that he is not doing anything to help himself at this point.  Pt shares he does see his PCP and a psychiatrist but not  a counselor at this point.  Pt is aware that a counselor would likely be helpful for him, but knows she cannot make that happen.  Pt shares that Marcello Moores got wait listed at [employer] and is waiting to hear from [facility] today at [place].  Pt is frustrated for Marcello Moores in his college application process.  Pt also shares that Ulice Dash seems to be doing OK; he is taking 2 classes at [employer] and she feels they have done a good job of telling him what they expect of him.  She is happy with the advances Ulice Dash has made.  Herbie Baltimore is doing well at this point too.  Pt continues to walk a lot, she is reading regularly, and "my new guilty pleasure is Tic Toc videos."  Encouraged pt to continue with her self care activities and we will meet in 3 wks for a follow up session.  Interventions: Cognitive Behavioral Therapy  Diagnosis:Adjustment disorder with depressed mood  Plan: Treatment Plan Strengths/Abilities:  Intelligent, Intuitive, Willing to participate in therapy Treatment Preferences:  Outpatient Individual Therapy Statement of Needs:  Patient is to use CBT, mindfulness and coping skills to help manage and/or decrease symptoms associated with their diagnosis. Symptoms:  Depressed/Irritable mood, worry, social withdrawal Problems Addressed:  Depressive thoughts, Sadness, Sleep issues, etc. Long Term Goals:  Pt to reduce overall level, frequency, and intensity of the feelings of depression as evidenced by  decreased irritability, negative self talk, and helpless feelings from 6 to 7 days/week to 0 to 1 days/week, per client report, for at least 3 consecutive months.  Progress: 30% Short Term Goals:  Pt to verbally express understanding of the relationship between feelings of depression and their impact on thinking patterns and behaviors.  Pt to verbalize an understanding of the role that distorted thinking plays in creating fears, excessive worry, and ruminations.  Progress: 30% Target Date:  11/23/2022 Frequency:   Monthly Modality:  Cognitive Behavioral Therapy Interventions by Therapist:  Therapist will use CBT, Mindfulness exercises, Coping skills and Referrals, as needed by client. Client has verbally approved this treatment plan.  Ivan Anchors, [facility]

## 2022-02-06 ENCOUNTER — Ambulatory Visit (INDEPENDENT_AMBULATORY_CARE_PROVIDER_SITE_OTHER): Payer: 59 | Admitting: Physician Assistant

## 2022-02-16 ENCOUNTER — Ambulatory Visit (INDEPENDENT_AMBULATORY_CARE_PROVIDER_SITE_OTHER): Payer: 59 | Admitting: Psychology

## 2022-02-16 DIAGNOSIS — F4321 Adjustment disorder with depressed mood: Secondary | ICD-10-CM | POA: Diagnosis not present
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
## 2022-02-19 ENCOUNTER — Ambulatory Visit (INDEPENDENT_AMBULATORY_CARE_PROVIDER_SITE_OTHER): Payer: 59 | Admitting: Physician Assistant

## 2022-02-19 ENCOUNTER — Other Ambulatory Visit: Payer: Self-pay

## 2022-02-19 ENCOUNTER — Encounter (INDEPENDENT_AMBULATORY_CARE_PROVIDER_SITE_OTHER): Payer: Self-pay | Admitting: Physician Assistant

## 2022-02-19 VITALS — BP 102/68 | HR 57 | Temp 97.9°F | Ht 65.0 in | Wt 184.0 lb

## 2022-02-19 DIAGNOSIS — Z6830 Body mass index (BMI) 30.0-30.9, adult: Secondary | ICD-10-CM

## 2022-02-19 DIAGNOSIS — E669 Obesity, unspecified: Secondary | ICD-10-CM | POA: Diagnosis not present

## 2022-02-19 DIAGNOSIS — R7303 Prediabetes: Secondary | ICD-10-CM | POA: Diagnosis not present

## 2022-02-21 NOTE — Progress Notes (Signed)
? ? ? ?Chief Complaint:  ? ?OBESITY ?Genae is here to discuss her progress with her obesity treatment plan along with follow-up of her obesity related diagnoses. Bobbette is on the Category 2 Plan plus 150 grams of protein and states she is following her eating plan approximately 50% of the time. Caria states she is walking for 30 minutes 3 times per week. ? ?Today's visit was #: 21 ?Starting weight: 239 lbs ?Starting date: 10/12/2020 ?Today's weight: 184 lbs ?Today's date: 02/19/2022 ?Total lbs lost to date: 55 lbs ?Total lbs lost since last in-office visit: 0 ? ?Interim History: Hadassa has been working 50 plus hours a week and has been struggling with her plan. For dinner she would eat what she wanted rather than eating on plan.  ? ?Subjective:  ? ?1. Pre-diabetes ?Liana is not on medications currently. Her last A1C was 5.7 on 12/2021. ? ?Assessment/Plan:  ? ?1. Pre-diabetes ?Lani will continue with the plan and she will continue to work on weight loss, exercise, and decreasing simple carbohydrates to help decrease the risk of diabetes.  ? ?2. Obesity, current BMI 30.62 ?Gabryella is currently in the action stage of change. As such, her goal is to continue with weight loss efforts. She has agreed to the Category 2 Plan plus 150 grams of protein. ? ?Exercise goals:  As is. ? ?Behavioral modification strategies: increasing lean protein intake and decreasing simple carbohydrates. ? ?Lakiea has agreed to follow-up with our clinic in 3-4 weeks. She was informed of the importance of frequent follow-up visits to maximize her success with intensive lifestyle modifications for her multiple health conditions.  ? ?Objective:  ? ?Blood pressure 102/68, pulse (!) 57, temperature 97.9 ?F (36.6 ?C), height 5\' 5"  (1.651 m), weight 184 lb (83.5 kg), SpO2 98 %. ?Body mass index is 30.62 kg/m?. ? ?General: Cooperative, alert, well developed, in no acute distress. ?HEENT: Conjunctivae and lids  unremarkable. ?Cardiovascular: Regular rhythm.  ?Lungs: Normal work of breathing. ?Neurologic: No focal deficits.  ? ?Lab Results  ?Component Value Date  ? CREATININE 1.01 (H) 12/18/2021  ? BUN 26 (H) 12/18/2021  ? NA 143 12/18/2021  ? K 5.0 12/18/2021  ? CL 105 12/18/2021  ? CO2 24 12/18/2021  ? ?Lab Results  ?Component Value Date  ? ALT 16 12/18/2021  ? AST 17 12/18/2021  ? ALKPHOS 115 12/18/2021  ? BILITOT 0.3 12/18/2021  ? ?Lab Results  ?Component Value Date  ? HGBA1C 5.7 (H) 12/18/2021  ? HGBA1C 5.7 (H) 08/22/2021  ? HGBA1C 5.6 04/19/2021  ? ?Lab Results  ?Component Value Date  ? INSULIN 8.3 12/18/2021  ? INSULIN 6.4 08/22/2021  ? INSULIN 5.8 04/19/2021  ? INSULIN 10.8 10/12/2020  ? ?Lab Results  ?Component Value Date  ? TSH 0.681 02/08/2014  ? ?Lab Results  ?Component Value Date  ? CHOL 179 12/18/2021  ? HDL 68 12/18/2021  ? [place] 95 12/18/2021  ? TRIG 86 12/18/2021  ? CHOLHDL 2.6 12/18/2021  ? ?Lab Results  ?Component Value Date  ? VD25OH 85.3 12/18/2021  ? VD25OH 100.0 08/22/2021  ? VD25OH 79.8 04/19/2021  ? ?Lab Results  ?Component Value Date  ? WBC 7.6 02/21/2014  ? HGB 14.6 02/21/2014  ? HCT 41.6 02/21/2014  ? MCV 85.2 02/21/2014  ? PLT 218 02/21/2014  ? ?No results found for: IRON, TIBC, FERRITIN ? ?Attestation Statements:  ? ?Reviewed by clinician on day of visit: allergies, medications, problem list, medical history, surgical history, family history, social history, and previous  encounter notes. ? ?Time spent on visit including pre-visit chart review and post-visit care and charting was 30 minutes. ? ? ?I, Tonye Pearson, am acting as Location manager for [employer], PA-C. ? ?I have reviewed the above documentation for accuracy and completeness, and I agree with the above. Abby Potash, PA-C ? ?

## 2022-03-15 ENCOUNTER — Ambulatory Visit (INDEPENDENT_AMBULATORY_CARE_PROVIDER_SITE_OTHER): Payer: 59 | Admitting: Physician Assistant

## 2022-03-16 ENCOUNTER — Ambulatory Visit (INDEPENDENT_AMBULATORY_CARE_PROVIDER_SITE_OTHER): Payer: 59 | Admitting: Psychology

## 2022-03-16 DIAGNOSIS — F4321 Adjustment disorder with depressed mood: Secondary | ICD-10-CM
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
## 2022-04-10 ENCOUNTER — Ambulatory Visit (INDEPENDENT_AMBULATORY_CARE_PROVIDER_SITE_OTHER): Payer: 59 | Admitting: Physician Assistant

## 2022-04-11 ENCOUNTER — Ambulatory Visit (INDEPENDENT_AMBULATORY_CARE_PROVIDER_SITE_OTHER): Payer: 59 | Admitting: Family Medicine

## 2022-04-11 ENCOUNTER — Encounter (INDEPENDENT_AMBULATORY_CARE_PROVIDER_SITE_OTHER): Payer: Self-pay | Admitting: Family Medicine

## 2022-04-11 VITALS — BP 117/76 | HR 67 | Temp 97.7°F | Ht 65.0 in | Wt 186.0 lb

## 2022-04-11 DIAGNOSIS — R7303 Prediabetes: Secondary | ICD-10-CM | POA: Diagnosis not present

## 2022-04-11 DIAGNOSIS — Z6831 Body mass index (BMI) 31.0-31.9, adult: Secondary | ICD-10-CM

## 2022-04-11 DIAGNOSIS — E669 Obesity, unspecified: Secondary | ICD-10-CM

## 2022-04-11 DIAGNOSIS — J3089 Other allergic rhinitis: Secondary | ICD-10-CM | POA: Diagnosis not present

## 2022-04-11 DIAGNOSIS — Z9189 Other specified personal risk factors, not elsewhere classified: Secondary | ICD-10-CM | POA: Insufficient documentation

## 2022-04-11 MED ORDER — LEVOCETIRIZINE DIHYDROCHLORIDE 5 MG PO TABS: 5.0000 mg | ORAL_TABLET | Freq: Every evening | ORAL | 0 refills | Status: DC

## 2022-04-13 ENCOUNTER — Ambulatory Visit (INDEPENDENT_AMBULATORY_CARE_PROVIDER_SITE_OTHER): Payer: 59 | Admitting: Psychology

## 2022-04-13 DIAGNOSIS — F4321 Adjustment disorder with depressed mood: Secondary | ICD-10-CM | POA: Diagnosis not present
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
## 2022-04-20 NOTE — Progress Notes (Signed)
Chief Complaint:   OBESITY Kendra Day is here to discuss her progress with her obesity treatment plan along with follow-up of her obesity related diagnoses. Kendra Day is on the Category 2 Plan and states she is following her eating plan approximately 50% of the time. Kendra Day states she is walking 30 to 45 minutes 4 times per week.  Today's visit was #: 22 Starting weight: 239 lbs Starting date: 10/12/2020 Today's weight: 186 lbs Today's date: 04/11/2022 Total lbs lost to date: 101 Total lbs lost since last in-office visit: 0  Interim History: This is Kendra Day 1st visit with me. She has had increased stress at work and is snacking on crumbled cookies and the like at work. Kendra Day is on 1300 calories per day with 150 grams of protein as her goal per day.  Subjective:   1. Pre-diabetes Kendra Day has a diagnosis of prediabetes based on her elevated HgA1c. She is not on medication and denies hunger or cravings per se. She was informed this puts her at greater risk of developing diabetes. She continues to work on diet and exercise to decrease her risk of diabetes.   2. Environmental and seasonal allergies Kendra Day has had increased allergy symptoms the past couple of weeks. She is taking Allegra and Singulair.  3. At risk for dehydration Kendra Day is at risk for dehydration with summer and inadequate water intake.  Assessment/Plan:  No orders of the defined types were placed in this encounter.   Medications Discontinued During This Encounter  Medication Reason   fexofenadine (ALLEGRA) 180 MG tablet      Meds ordered this encounter  Medications   levocetirizine (XYZAL) 5 MG tablet    Sig: Take 1 tablet (5 mg total) by mouth every evening.    Dispense:  30 tablet    Refill:  0     1. Pre-diabetes Kendra Day will continue to work on weight loss, exercise, and decreasing simple carbohydrates to help decrease the risk of diabetes.   2. Environmental and seasonal  allergies Kendra Day agrees to change from [employer] to Xyzal.   - levocetirizine (XYZAL) 5 MG tablet; Take 1 tablet (5 mg total) by mouth every evening.  Dispense: 30 tablet; Refill: 0  3. At risk for dehydration Kendra Day was given approximately 9 minutes of dehydration prevention counseling today. Kendra Day is at risk for dehydration due to weight loss and current medication(s). She was encouraged to hydrate and monitor fluid status to avoid dehydration as weight loss plateaus.   4. Obesity, Current BMI 31.1 A journaling log was given to Kendra Day to help her with accountability.  Kendra Day is currently in the action stage of change. As such, her goal is to continue with weight loss efforts. She has agreed to keeping a food journal and adhering to recommended goals of 1300 calories and 120+ grams of protein daily.  Exercise goals: For substantial health benefits, adults should do at least 150 minutes (2 hours and 30 minutes) a week of moderate-intensity, or 75 minutes (1 hour and 15 minutes) a week of vigorous-intensity aerobic physical activity, or an equivalent combination of moderate- and vigorous-intensity aerobic activity. Aerobic activity should be performed in episodes of at least 10 minutes, and preferably, it should be spread throughout the week.  Behavioral modification strategies: planning for success and keeping a strict food journal.  Kendra Day has agreed to follow-up with our clinic in 4 weeks. She was informed of the importance of frequent follow-up visits to maximize her success with intensive lifestyle modifications for  her multiple health conditions.   Objective:   Blood pressure 117/76, pulse 67, temperature 97.7 F (36.5 C), height 5\' 5"  (1.651 m), weight 186 lb (84.4 kg), SpO2 100 %. Body mass index is 30.95 kg/m.  General: Cooperative, alert, well developed, in no acute distress. HEENT: Conjunctivae and lids unremarkable. Cardiovascular: Regular rhythm.  Lungs: Normal  work of breathing. Neurologic: No focal deficits.   Lab Results  Component Value Date   CREATININE 1.01 (H) 12/18/2021   BUN 26 (H) 12/18/2021   NA 143 12/18/2021   K 5.0 12/18/2021   CL 105 12/18/2021   CO2 24 12/18/2021   Lab Results  Component Value Date   ALT 16 12/18/2021   AST 17 12/18/2021   ALKPHOS 115 12/18/2021   BILITOT 0.3 12/18/2021   Lab Results  Component Value Date   HGBA1C 5.7 (H) 12/18/2021   HGBA1C 5.7 (H) 08/22/2021   HGBA1C 5.6 04/19/2021   Lab Results  Component Value Date   INSULIN 8.3 12/18/2021   INSULIN 6.4 08/22/2021   INSULIN 5.8 04/19/2021   INSULIN 10.8 10/12/2020   Lab Results  Component Value Date   TSH 0.681 02/08/2014   Lab Results  Component Value Date   CHOL 179 12/18/2021   HDL 68 12/18/2021   LDLCALC 95 12/18/2021   TRIG 86 12/18/2021   CHOLHDL 2.6 12/18/2021   Lab Results  Component Value Date   VD25OH 85.3 12/18/2021   VD25OH 100.0 08/22/2021   VD25OH 79.8 04/19/2021   Lab Results  Component Value Date   WBC 7.6 02/21/2014   HGB 14.6 02/21/2014   HCT 41.6 02/21/2014   MCV 85.2 02/21/2014   PLT 218 02/21/2014   No results found for: IRON, TIBC, FERRITIN  Attestation Statements:   Reviewed by clinician on day of visit: allergies, medications, problem list, medical history, surgical history, family history, social history, and previous encounter notes.  IMarcille Blanco, CMA, am acting as transcriptionist for [employer], DO  I have reviewed the above documentation for accuracy and completeness, and I agree with the above. Marjory Sneddon, D.O.  The [place] was signed into law in 2016 which includes the topic of electronic health records.  This provides immediate access to information in MyChart.  This includes consultation notes, operative notes, office notes, lab results and pathology reports.  If you have any questions about what you read please let us know at your next visit so we can  discuss your concerns and take corrective action if need be.  We are right here with you.

## 2022-05-10 ENCOUNTER — Ambulatory Visit (INDEPENDENT_AMBULATORY_CARE_PROVIDER_SITE_OTHER): Payer: 59 | Admitting: Family Medicine

## 2022-05-11 ENCOUNTER — Ambulatory Visit (INDEPENDENT_AMBULATORY_CARE_PROVIDER_SITE_OTHER): Payer: 59 | Admitting: Psychology

## 2022-05-11 DIAGNOSIS — F4321 Adjustment disorder with depressed mood: Secondary | ICD-10-CM | POA: Diagnosis not present

## 2022-05-11 NOTE — Progress Notes (Signed)
[facility] Counselor/Therapist Progress Note  Patient ID: GLENDA FALLS, MRN: [id number],    Date: 05/11/2022  Time Spent: 50 mins  Treatment Type: Individual Therapy  Reported Symptoms: Pt presented for follow up session, via webex video.  Pt grants consent for session, stating she is in her home with no one else present.  I shared with pt that I am in my office at home with no one else present here either.    Mental Status Exam: Appearance:  Casual     Behavior: Appropriate  Motor: Normal  Speech/Language:  Clear and Coherent  Affect: Appropriate  Mood: normal  Thought process: normal  Thought content:   WNL  Sensory/Perceptual disturbances:   WNL  Orientation: oriented to person, place, and time/date  Attention: Good  Concentration: Good  Memory: WNL  Fund of knowledge:  Good  Insight:   Good  Judgment:  Good  Impulse Control: Good   Risk Assessment: Danger to Self:  No Self-injurious Behavior: No Danger to Others: No Duty to Warn:no Physical Aggression / Violence:No  Access to Firearms a concern: No  Gang Involvement:No   Subjective: Pt shares Marcello Moores' room mate has chosen not to go to [employer] so Marcello Moores is now looking for a new room mate.  Pt shares their trip to [place] "was great.  The boys bonded more than they had previously and that was the best part of the trip."  Ulice Dash has been contacted by a company called "[employer]" that is contracted with the State [place] that helps students who flunked out of college get re-enrolled at a university.  Pt and Herbie Baltimore have talked with Ulice Dash several times about what he wants to do in the Fall.  Pt shares that her former boss Lennette Bihari) was referred to a neuro-psychologist for evaluation by his PCP.  His executive functioning is not working and they are now trying to figure out why that is the case.  Pt shares that she enjoyed her trip to the [place] with the family; she has been reading, walking, and watching Tic Toc videos.   Encouraged pt to continue with her self care activities and we will meet in 4 wks for a follow up session.  Interventions: Cognitive Behavioral Therapy  Diagnosis:Adjustment disorder with depressed mood  Plan: Treatment Plan Strengths/Abilities:  Intelligent, Intuitive, Willing to participate in therapy Treatment Preferences:  Outpatient Individual Therapy Statement of Needs:  Patient is to use CBT, mindfulness and coping skills to help manage and/or decrease symptoms associated with their diagnosis. Symptoms:  Depressed/Irritable mood, worry, social withdrawal Problems Addressed:  Depressive thoughts, Sadness, Sleep issues, etc. Long Term Goals:  Pt to reduce overall level, frequency, and intensity of the feelings of depression as evidenced by decreased irritability, negative self talk, and helpless feelings from 6 to 7 days/week to 0 to 1 days/week, per client report, for at least 3 consecutive months.  Progress: 30% Short Term Goals:  Pt to verbally express understanding of the relationship between feelings of depression and their impact on thinking patterns and behaviors.  Pt to verbalize an understanding of the role that distorted thinking plays in creating fears, excessive worry, and ruminations.  Progress: 30% Target Date:  11/23/2022 Frequency:  Monthly Modality:  Cognitive Behavioral Therapy Interventions by Therapist:  Therapist will use CBT, Mindfulness exercises, Coping skills and Referrals, as needed by client. Client has verbally approved this treatment plan.  Ivan Anchors, [facility]

## 2022-05-17 ENCOUNTER — Encounter (INDEPENDENT_AMBULATORY_CARE_PROVIDER_SITE_OTHER): Payer: Self-pay | Admitting: Family Medicine

## 2022-05-17 ENCOUNTER — Ambulatory Visit (INDEPENDENT_AMBULATORY_CARE_PROVIDER_SITE_OTHER): Payer: 59 | Admitting: Family Medicine

## 2022-05-17 VITALS — BP 114/75 | HR 58 | Temp 97.9°F | Ht 65.0 in | Wt 186.0 lb

## 2022-05-17 DIAGNOSIS — Z9189 Other specified personal risk factors, not elsewhere classified: Secondary | ICD-10-CM

## 2022-05-17 DIAGNOSIS — R7989 Other specified abnormal findings of blood chemistry: Secondary | ICD-10-CM | POA: Diagnosis not present

## 2022-05-17 DIAGNOSIS — R7303 Prediabetes: Secondary | ICD-10-CM

## 2022-05-17 DIAGNOSIS — J302 Other seasonal allergic rhinitis: Secondary | ICD-10-CM | POA: Diagnosis not present

## 2022-05-17 DIAGNOSIS — E669 Obesity, unspecified: Secondary | ICD-10-CM | POA: Diagnosis not present

## 2022-05-17 DIAGNOSIS — Z6831 Body mass index (BMI) 31.0-31.9, adult: Secondary | ICD-10-CM

## 2022-05-23 NOTE — Progress Notes (Unsigned)
Chief Complaint:   OBESITY Kendra Day is here to discuss her progress with her obesity treatment plan along with follow-up of her obesity related diagnoses. Kendra Day is on keeping a food journal and adhering to recommended goals of 1300 calories and 150 grams protein and states she is following her eating plan approximately 25% of the time. Kendra Day states she is walking 30 minutes 4 times per week.  Today's visit was #: 23 Starting weight: 239 lbs Starting date: 10/12/2020 Today's weight: 186 lbs Today's date: 05/17/2022 Total lbs lost to date: 53 Total lbs lost since last in-office visit: 0  Interim History: Pt went to the [place] on vacation and her son's graduation. She is happy she didn't gain weight, which was her goal. Pt does report some carb cravings.  Subjective:   1. Pre-diabetes Discussed labs with patient today. Pt's A1c was 5.7 a couple of months ago. Medication: None  2. Seasonal allergies She changed form Xyzal to Allegra 5 mg, as Xyzal felt too strong to pt.  3. Elevated serum creatinine Discussed labs with patient today. Pt had an elevated serum creatinine on last lab draw.  4. At risk for dehydration Kendra Day is at risk for dehydration due to elevated BUN and creatinine and poor water intake.  Assessment/Plan:   1. Pre-diabetes Kendra Day will continue to work on weight loss, exercise, and decreasing simple carbohydrates to help decrease the risk of diabetes. Pt will consider Metformin at next OV. Handouts given and discussion had.  2. Seasonal allergies Continue Singulair and Allegra. Pt advised to take 1/2 tab of Xyzal in the future if desired.  3. Elevated serum creatinine Counseling done. Increase water intake, avoid, excess NSAIDS. We will continue to monitor.  4. At risk for dehydration Kendra Day is at higher than average risk of dehydration. Kendra Day was given more than 9 minutes of proper hydration counseling today. We discussed the  signs and symptoms of dehydration, some of which may include muscle cramping, constipation or even orthostatic symptoms. Counseling on the prevention of dehydration was also provided today. Kendra Day is at risk for dehydration due to weight loss, lifestyle and behavorial habits and possibly due to taking certain medication(s). She was encouraged to adequately hydrate and monitor fluid status to avoid dehydration as well as weight loss plateaus. Unless pre-existing renal or cardiopulmonary conditions exist, in which patient was told to limit their fluid intake, I recommended roughly one half of their weight in pounds to be the approximate ounces of non-caloric, non-caffeinated beverages they should drink per day; including more if they are engaging in exercise.  Kendra Day is at higher than average risk of dehydration. Kendra Day was given more than 9 minutes of proper hydration counseling today. We discussed the signs and symptoms of dehydration, some of which may include muscle cramping, constipation, or even orthostatic symptoms. Counseling on the prevention of dehydration was also provided today. Kendra Day is at risk for dehydration due to weight loss, lifestyle and behavorial habits, and possibly due to taking certain medication(s).  She was encouraged to adequately hydrate and monitor fluid status to avoid dehydration as well as weight loss plateaus. Unless pre-existing renal or cardiopulmonary conditions exist, which pt was told to limit their fluid intake. I recommended roughly one half of their weight in pounds to be the approximate ounces of non-caloric, non-caffeinated beverages they should drink per day; including more if they are engaging in exercise.  5. Obesity, Current BMI 31.1 Kendra Day is currently in the action stage of change.  As such, her goal is to continue with weight loss efforts. She has agreed to keeping a food journal and adhering to recommended goals of 1300 calories and 126 grams protein.    Handouts: Metformin, Insulin Resistance/Pre-diabetes Pt will think about Metformin and let us know at her next OV.  Exercise goals:  As is  Behavioral modification strategies: increasing lean protein intake, decreasing simple carbohydrates, and planning for success.  Kendra Day has agreed to follow-up with our clinic in 3-4 weeks. She was informed of the importance of frequent follow-up visits to maximize her success with intensive lifestyle modifications for her multiple health conditions.   Objective:   Blood pressure 114/75, pulse (!) 58, temperature 97.9 F (36.6 C), height 5\' 5"  (1.651 m), weight 186 lb (84.4 kg), SpO2 100 %. Body mass index is 30.95 kg/m.  General: Cooperative, alert, well developed, in no acute distress. HEENT: Conjunctivae and lids unremarkable. Cardiovascular: Regular rhythm.  Lungs: Normal work of breathing. Neurologic: No focal deficits.   Lab Results  Component Value Date   CREATININE 1.01 (H) 12/18/2021   BUN 26 (H) 12/18/2021   NA 143 12/18/2021   K 5.0 12/18/2021   CL 105 12/18/2021   CO2 24 12/18/2021   Lab Results  Component Value Date   ALT 16 12/18/2021   AST 17 12/18/2021   ALKPHOS 115 12/18/2021   BILITOT 0.3 12/18/2021   Lab Results  Component Value Date   HGBA1C 5.7 (H) 12/18/2021   HGBA1C 5.7 (H) 08/22/2021   HGBA1C 5.6 04/19/2021   Lab Results  Component Value Date   INSULIN 8.3 12/18/2021   INSULIN 6.4 08/22/2021   INSULIN 5.8 04/19/2021   INSULIN 10.8 10/12/2020   Lab Results  Component Value Date   TSH 0.681 02/08/2014   Lab Results  Component Value Date   CHOL 179 12/18/2021   HDL 68 12/18/2021   LDLCALC 95 12/18/2021   TRIG 86 12/18/2021   CHOLHDL 2.6 12/18/2021   Lab Results  Component Value Date   VD25OH 85.3 12/18/2021   VD25OH 100.0 08/22/2021   VD25OH 79.8 04/19/2021   Lab Results  Component Value Date   WBC 7.6 02/21/2014   HGB 14.6 02/21/2014   HCT 41.6 02/21/2014   MCV 85.2 02/21/2014    PLT 218 02/21/2014    Attestation Statements:   Reviewed by clinician on day of visit: allergies, medications, problem list, medical history, surgical history, family history, social history, and previous encounter notes.  I, 02/23/2014, BS, CMA, am acting as transcriptionist for Kyung Rudd, DO.  I have reviewed the above documentation for accuracy and completeness, and I agree with the above. -  ***

## 2022-06-08 ENCOUNTER — Ambulatory Visit (INDEPENDENT_AMBULATORY_CARE_PROVIDER_SITE_OTHER): Payer: 59 | Admitting: Psychology

## 2022-06-08 DIAGNOSIS — F4321 Adjustment disorder with depressed mood: Secondary | ICD-10-CM | POA: Diagnosis not present

## 2022-06-08 NOTE — Progress Notes (Signed)
[facility] Counselor/Therapist Progress Note  Patient ID: Kendra Day, MRN: [id number],    Date: 06/08/2022  Time Spent: 50 mins  Treatment Type: Individual Therapy  Reported Symptoms: Pt presented for follow up session, via webex video.  Pt grants consent for session, stating she is in her office with no one else present.  I shared with pt that I am in my office at home with no one else present here either.    Mental Status Exam: Appearance:  Casual     Behavior: Appropriate  Motor: Normal  Speech/Language:  Clear and Coherent  Affect: Appropriate  Mood: normal  Thought process: normal  Thought content:   WNL  Sensory/Perceptual disturbances:   WNL  Orientation: oriented to person, place, and time/date  Attention: Good  Concentration: Good  Memory: WNL  Fund of knowledge:  Good  Insight:   Good  Judgment:  Good  Impulse Control: Good   Risk Assessment: Danger to Self:  No Self-injurious Behavior: No Danger to Others: No Duty to Warn:no Physical Aggression / Violence:No  Access to Firearms a concern: No  Gang Involvement:No   Subjective: Pt shares that she is excited for Kendra Fus as he prepares to go away to [employer] in August; he will have about 45 credit hours going with him.  He is going to be living in a large living situation with 5 other guys.  He will be taking 14 credit hours in the Fall semester.  She is so proud of him "blossoming into a brand new kid."  Pt shares that Kendra Day continues to work about 3 days a week for [employer]; pt and Kendra Day talked with Kendra Day this past weekend and told him they expect him to get a more regular job with more regular medicine.  They are requiring him to begin paying $100.00 per month for his car insurance and to pay for his school, if he chooses to continue with school.  Pt shares that the boys have continued to be semi-close since their [place] trip.  Kendra Fus is looking forward to the college experience.  Pt shares that her  former boss Kendra Day) has seen the neuro-psychologist and they have determined that there is no organic structural issues going on but rather the issues are emotional/psychological in nature.  He continues to see a psychiatrist for medication but is not seeing a therapist.  Pt shares that she continues to engage with reading, listening to audio books, being intentional about work/life balance, walking the dogs, and watching Tic Toc videos.  Encouraged pt to continue with her self care activities and we will meet in 4 wks for a follow up session.  Interventions: Cognitive Behavioral Therapy  Diagnosis:Adjustment disorder with depressed mood  Plan: Treatment Plan Strengths/Abilities:  Intelligent, Intuitive, Willing to participate in therapy Treatment Preferences:  Outpatient Individual Therapy Statement of Needs:  Patient is to use CBT, mindfulness and coping skills to help manage and/or decrease symptoms associated with their diagnosis. Symptoms:  Depressed/Irritable mood, worry, social withdrawal Problems Addressed:  Depressive thoughts, Sadness, Sleep issues, etc. Long Term Goals:  Pt to reduce overall level, frequency, and intensity of the feelings of depression as evidenced by decreased irritability, negative self talk, and helpless feelings from 6 to 7 days/week to 0 to 1 days/week, per client report, for at least 3 consecutive months.  Progress: 30% Short Term Goals:  Pt to verbally express understanding of the relationship between feelings of depression and their impact on thinking patterns and behaviors.  Pt to verbalize an understanding of the role that distorted thinking plays in creating fears, excessive worry, and ruminations.  Progress: 30% Target Date:  11/23/2022 Frequency:  Monthly Modality:  Cognitive Behavioral Therapy Interventions by Therapist:  Therapist will use CBT, Mindfulness exercises, Coping skills and Referrals, as needed by client. Client has verbally approved this  treatment plan.  Karie Kirks, [facility]

## 2022-06-20 ENCOUNTER — Encounter (INDEPENDENT_AMBULATORY_CARE_PROVIDER_SITE_OTHER): Payer: Self-pay | Admitting: Family Medicine

## 2022-06-20 ENCOUNTER — Ambulatory Visit (INDEPENDENT_AMBULATORY_CARE_PROVIDER_SITE_OTHER): Payer: 59 | Admitting: Family Medicine

## 2022-06-20 VITALS — BP 123/82 | HR 69 | Temp 97.5°F | Ht 65.0 in | Wt 189.0 lb

## 2022-06-20 DIAGNOSIS — R7303 Prediabetes: Secondary | ICD-10-CM

## 2022-06-20 DIAGNOSIS — Z9189 Other specified personal risk factors, not elsewhere classified: Secondary | ICD-10-CM

## 2022-06-20 DIAGNOSIS — E559 Vitamin D deficiency, unspecified: Secondary | ICD-10-CM | POA: Diagnosis not present

## 2022-06-20 DIAGNOSIS — Z6831 Body mass index (BMI) 31.0-31.9, adult: Secondary | ICD-10-CM

## 2022-06-20 DIAGNOSIS — E669 Obesity, unspecified: Secondary | ICD-10-CM | POA: Diagnosis not present

## 2022-06-20 DIAGNOSIS — N182 Chronic kidney disease, stage 2 (mild): Secondary | ICD-10-CM

## 2022-06-21 LAB — BASIC METABOLIC PANEL
BUN/Creatinine Ratio: 26 — ABNORMAL HIGH (ref 9–23)
BUN: 29 mg/dL — ABNORMAL HIGH (ref 6–24)
CO2: 24 mmol/L (ref 20–29)
Calcium: 10.2 mg/dL (ref 8.7–10.2)
Chloride: 101 mmol/L (ref 96–106)
Creatinine, Ser: 1.12 mg/dL — ABNORMAL HIGH (ref 0.57–1.00)
Glucose: 80 mg/dL (ref 70–99)
Potassium: 4.9 mmol/L (ref 3.5–5.2)
Sodium: 138 mmol/L (ref 134–144)
eGFR: 59 mL/min/{1.73_m2} — ABNORMAL LOW (ref >59)

## 2022-06-21 LAB — VITAMIN D 25 HYDROXY (VIT D DEFICIENCY, FRACTURES): Vit D, 25-Hydroxy: 70.6 ng/mL (ref 30.0–100.0)

## 2022-06-21 LAB — HEMOGLOBIN A1C
Est. average glucose Bld gHb Est-mCnc: 120 mg/dL
Hgb A1c MFr Bld: 5.8 % — ABNORMAL HIGH (ref 4.8–5.6)

## 2022-06-22 NOTE — Progress Notes (Unsigned)
Chief Complaint:   OBESITY Kendra Day is here to discuss her progress with her obesity treatment plan along with follow-up of her obesity related diagnoses. Kendra Day is on keeping a food journal and adhering to recommended goals of 1300 calories and 120 protein and states she is following her eating plan approximately 25% of the time. Kendra Day states she is walking 30-45 minutes 4-5 times per week.  Today's visit was #: 24 Starting weight: 239 lbs Starting date: 10/12/2020 Today's weight: 189 lbs Today's date: 06/20/2022 Total lbs lost to date: 50 lbs Total lbs lost since last in-office visit: +3  Interim History: For the past 5 weeks Kendra Day has not felt like following the meal plan.  She just started back following the plan a couple of days ago.  Has not been journaling, has not had any time.  She is drinking about 80 oz of water daily.   Subjective:   1. Stage 2 chronic kidney disease [name] has a history of renal failure with pregnancy.  Her last GFR was >60.  Her serum creatinine was just over 1.00.  2. Pre-diabetes Kendra Day has a diagnosis of prediabetes based on her elevated HgA1c and was informed this puts her at greater risk of developing diabetes. She continues to work on diet and exercise to decrease her risk of diabetes. She denies nausea or hypoglycemia. Kendra Day's last A1c was 5.7.   3. Vitamin D deficiency She is currently taking no vitamin D supplement. She denies nausea, vomiting or muscle weakness. Kendra Day stopped taking OTC vitamin d  about 5-6 months ago.   4. At risk for dehydration [name] is at risk for dehydration due to lack of water intake.   Assessment/Plan:   Orders Placed This Encounter  Procedures   Hemoglobin A1c   Basic metabolic panel   VITAMIN D 25 Hydroxy (Vit-D Deficiency, Fractures)    There are no discontinued medications.   No orders of the defined types were placed in this encounter.    1. Stage 2 chronic kidney  disease Plan: - Extensively discussed importance of following prudent healthy meal plan and engaging in exercise (to increase blood flow to kidneys)  - Also stressed importance of good control of blood pressure and blood sugar as it pertains to pt's chronic diseases.    - Patient reminded to also avoid nephrotoxic substances.  - All pt's questions/ concerns were addressed.  - We will continue to monitor serum creatinine and GFR.  Check BMP with GFR, control blood sugars and blood pressures.  Increase water intake and continue exercise. Continue prudent nutritional plan and weight loss.   - Basic metabolic panel  2. Pre-diabetes Sarit will continue to work on weight loss, exercise, and decreasing simple carbohydrates to help decrease the risk of diabetes. We discussed Metformin last office visit but she is not ready to start it.  Check labs today.  - Hemoglobin A1c  3. Vitamin D deficiency Low Vitamin D level contributes to fatigue and are associated with obesity, breast, and colon cancer. She agrees to continue to take prescription Vitamin D @50 ,000 IU every week and will follow-up for routine testing of Vitamin D, at least 2-3 times per year to avoid over-replacement. Check labs today.  - VITAMIN D 25 Hydroxy (Vit-D Deficiency, Fractures)  4. At risk for dehydration Teagyn was given approximately 9 minutes of dehydration prevention counseling today. Kendra Day is at risk for dehydration due to weight loss and current medication(s). She was encouraged to hydrate and monitor fluid  status to avoid dehydration as weight loss plateaus.   5. Obesity, current BMI, 31.5 Increase water intake to 1/2 her body weight in ounces per day.  Bring in journaling log next office visit.   Kendria is currently in the action stage of change. As such, her goal is to continue with weight loss efforts. She has agreed to keeping a food journal and adhering to recommended goals of 1300 calories and 120+  protein daily.   Exercise goals:  As is.   Behavioral modification strategies: meal planning and cooking strategies and keeping healthy foods in the home.  Kendra Day has agreed to follow-up with our clinic in 4 weeks. She was informed of the importance of frequent follow-up visits to maximize her success with intensive lifestyle modifications for her multiple health conditions.   Kendra Day was informed we would discuss her lab results at her next visit unless there is a critical issue that needs to be addressed sooner. Kendra Day agreed to keep her next visit at the agreed upon time to discuss these results.  Objective:   Blood pressure 123/82, pulse 69, temperature (!) 97.5 F (36.4 C), height 5\' 5"  (1.651 m), weight 189 lb (85.7 kg), SpO2 100 %. Body mass index is 31.45 kg/m.  General: Cooperative, alert, well developed, in no acute distress. HEENT: Conjunctivae and lids unremarkable. Cardiovascular: Regular rhythm.  Lungs: Normal work of breathing. Neurologic: No focal deficits.   Lab Results  Component Value Date   CREATININE 1.12 (H) 06/20/2022   BUN 29 (H) 06/20/2022   NA 138 06/20/2022   K 4.9 06/20/2022   CL 101 06/20/2022   CO2 24 06/20/2022   Lab Results  Component Value Date   ALT 16 12/18/2021   AST 17 12/18/2021   ALKPHOS 115 12/18/2021   BILITOT 0.3 12/18/2021   Lab Results  Component Value Date   HGBA1C 5.8 (H) 06/20/2022   HGBA1C 5.7 (H) 12/18/2021   HGBA1C 5.7 (H) 08/22/2021   HGBA1C 5.6 04/19/2021   Lab Results  Component Value Date   INSULIN 8.3 12/18/2021   INSULIN 6.4 08/22/2021   INSULIN 5.8 04/19/2021   INSULIN 10.8 10/12/2020   Lab Results  Component Value Date   TSH 0.681 02/08/2014   Lab Results  Component Value Date   CHOL 179 12/18/2021   HDL 68 12/18/2021   LDLCALC 95 12/18/2021   TRIG 86 12/18/2021   CHOLHDL 2.6 12/18/2021   Lab Results  Component Value Date   VD25OH 70.6 06/20/2022   VD25OH 85.3 12/18/2021   VD25OH 100.0  08/22/2021   Lab Results  Component Value Date   WBC 7.6 02/21/2014   HGB 14.6 02/21/2014   HCT 41.6 02/21/2014   MCV 85.2 02/21/2014   PLT 218 02/21/2014   No results found for: "IRON", "TIBC", "FERRITIN"  Attestation Statements:   Reviewed by clinician on day of visit: allergies, medications, problem list, medical history, surgical history, family history, social history, and previous encounter notes.  I, 02/23/2014, RMA, am acting as [employer] for Energy manager, DO.  I have reviewed the above documentation for accuracy and completeness, and I agree with the above. [employer], D.O.  The 21st Century Cures Act was signed into law in 2016 which includes the topic of electronic health records.  This provides immediate access to information in MyChart.  This includes consultation notes, operative notes, office notes, lab results and pathology reports.  If you have any questions about what you read please let 2017 know  at your next visit so we can discuss your concerns and take corrective action if need be.  We are right here with you.

## 2022-07-11 ENCOUNTER — Encounter (INDEPENDENT_AMBULATORY_CARE_PROVIDER_SITE_OTHER): Payer: Self-pay

## 2022-07-11 ENCOUNTER — Other Ambulatory Visit: Payer: Self-pay | Admitting: Obstetrics and Gynecology

## 2022-07-11 DIAGNOSIS — Z1231 Encounter for screening mammogram for malignant neoplasm of breast: Secondary | ICD-10-CM

## 2022-07-13 ENCOUNTER — Ambulatory Visit (INDEPENDENT_AMBULATORY_CARE_PROVIDER_SITE_OTHER): Payer: 59 | Admitting: Psychology

## 2022-07-13 DIAGNOSIS — F4321 Adjustment disorder with depressed mood: Secondary | ICD-10-CM | POA: Diagnosis not present

## 2022-07-13 NOTE — Progress Notes (Signed)
[facility] Counselor/Therapist Progress Note  Patient ID: Kendra Day, MRN: [id number],    Date: 07/13/2022  Time Spent: 50 mins  Treatment Type: Individual Therapy  Reported Symptoms: Pt presented for follow up session, via webex video.  Pt grants consent for session, stating she is in her home with no one else present.  I shared with pt that I am in my office at home with no one else present here either.    Mental Status Exam: Appearance:  Casual     Behavior: Appropriate  Motor: Normal  Speech/Language:  Clear and Coherent  Affect: Appropriate  Mood: normal  Thought process: normal  Thought content:   WNL  Sensory/Perceptual disturbances:   WNL  Orientation: oriented to person, place, and time/date  Attention: Good  Concentration: Good  Memory: WNL  Fund of knowledge:  Good  Insight:   Good  Judgment:  Good  Impulse Control: Good   Risk Assessment: Danger to Self:  No Self-injurious Behavior: No Danger to Others: No Duty to Warn:no Physical Aggression / Violence:No  Access to Firearms a concern: No  Gang Involvement:No   Subjective: Pt shares that she is getting ready to take [name] to [employer] on Tuesday.  Pt shares that it will be a big adjustment for her to not have Thomas around the home anymore.  She is excited for him and she and [name] will be going to [place] several times this Fall for football games.  Pt shares she has been working from home several times lately.  [name] officially retired as 8/1.  His providers are taking him off all of his medications to re-assess his needs moving forward.  She is feeling really sad that he has left, but she knows it is the right decision for him.  It has been a year since he had his issues and just did not recover.  Pt shares that [name] is working 3-4 days per week and hopes to be working more hours per week once college kids go back to school.  Pt wishes that he was more intentional about what to do for  himself next.  He has not been paying his parents the 100.00 per month for his car insurance.  Pt , [name], and [name] had a conversation yesterday about him needing to pay this money as a part of being responsible.  He has also been demanding about pt making dinner for him on his schedule; "he always comes at [name] hard for things he wants; he is often negative.  He is complaining, arguing, and demanding."  They told [name] that he needs to work on how he interacts with them.  She has told [name] that, if he cannot change the way he interacts with them, he may need to find another place to live.  [name] is not at the same place with this issue.  [name] wants him to realize that he can do better and that he will then begin doing better.  We talked about "in the absence of consequence, human behavior will not change."  Encouraged pt to be sure that she and [name] continue to talk about their concerns for [name] and try to be sure they understand each other's perspectives.  Pt shares that she continues to engage with reading, listening to audio books, being intentional about work/life balance, walking the dogs, and watching Tic Toc videos.  Encouraged pt to continue with her self care activities and we will meet in 4 wks for a  follow up session.  Interventions: Cognitive Behavioral Therapy  Diagnosis:Adjustment disorder with depressed mood  Plan: Treatment Plan Strengths/Abilities:  Intelligent, Intuitive, Willing to participate in therapy Treatment Preferences:  Outpatient Individual Therapy Statement of Needs:  Patient is to use CBT, mindfulness and coping skills to help manage and/or decrease symptoms associated with their diagnosis. Symptoms:  Depressed/Irritable mood, worry, social withdrawal Problems Addressed:  Depressive thoughts, Sadness, Sleep issues, etc. Long Term Goals:  Pt to reduce overall level, frequency, and intensity of the feelings of depression as evidenced by decreased irritability, negative self  talk, and helpless feelings from 6 to 7 days/week to 0 to 1 days/week, per client report, for at least 3 consecutive months.  Progress: 30% Short Term Goals:  Pt to verbally express understanding of the relationship between feelings of depression and their impact on thinking patterns and behaviors.  Pt to verbalize an understanding of the role that distorted thinking plays in creating fears, excessive worry, and ruminations.  Progress: 30% Target Date:  11/23/2022 Frequency:  Monthly Modality:  Cognitive Behavioral Therapy Interventions by Therapist:  Therapist will use CBT, Mindfulness exercises, Coping skills and Referrals, as needed by client. Client has verbally approved this treatment plan.  Karie Kirks, [facility]

## 2022-07-18 ENCOUNTER — Encounter (INDEPENDENT_AMBULATORY_CARE_PROVIDER_SITE_OTHER): Payer: Self-pay | Admitting: Family Medicine

## 2022-07-18 ENCOUNTER — Ambulatory Visit (INDEPENDENT_AMBULATORY_CARE_PROVIDER_SITE_OTHER): Payer: 59 | Admitting: Family Medicine

## 2022-07-18 VITALS — BP 115/70 | HR 59 | Temp 98.0°F | Ht 65.0 in | Wt 187.0 lb

## 2022-07-18 DIAGNOSIS — E669 Obesity, unspecified: Secondary | ICD-10-CM | POA: Diagnosis not present

## 2022-07-18 DIAGNOSIS — E559 Vitamin D deficiency, unspecified: Secondary | ICD-10-CM | POA: Diagnosis not present

## 2022-07-18 DIAGNOSIS — Z6831 Body mass index (BMI) 31.0-31.9, adult: Secondary | ICD-10-CM

## 2022-07-18 DIAGNOSIS — R7303 Prediabetes: Secondary | ICD-10-CM

## 2022-07-18 DIAGNOSIS — N1831 Chronic kidney disease, stage 3a: Secondary | ICD-10-CM

## 2022-07-18 DIAGNOSIS — Z9189 Other specified personal risk factors, not elsewhere classified: Secondary | ICD-10-CM

## 2022-07-24 NOTE — Progress Notes (Signed)
Chief Complaint:   OBESITY Kendra Day is here to discuss her progress with her obesity treatment plan along with follow-up of her obesity related diagnoses. Kendra Day is on keeping a food journal and adhering to recommended goals of 1300 calories and 120+ grams protein and states she is following her eating plan approximately 60% of the time. Kendra Day states she is walking 30-40 minutes 3-4 times per week.  Today's visit was #: 25 Starting weight: 239 lbs Starting date: 10/12/2020 Today's weight: 187 lbs Today's date: 07/18/2022 Total lbs lost to date: 52 Total lbs lost since last in-office visit: 2  Interim History: For 2 weeks, Kendra Day journaled and ate 1300 calories and 100-110 grams of protein. Some days, pt is hungry and other days she is not hungry in the afternoons.  Subjective:   1. Pre-diabetes Worsening. Discussed labs with patient today. Pt has no concerns about meal plan today. She reports occasional hungry and is not sure why. She expresses it might be emotional.  2. Stage 3a chronic kidney disease (HCC) Worsening. Discussed labs with patient today. Pt is asymptomatic without concerns.  3. Vitamin D deficiency Discussed labs with patient today. Pt has been off all Vit D supplementation for 6 months.  4. At risk for impaired metabolic function Kendra Day is at increased risk for impaired metabolic function due to current nutrition and muscle mass.  Assessment/Plan:  No orders of the defined types were placed in this encounter.   There are no discontinued medications.   No orders of the defined types were placed in this encounter.    1. Pre-diabetes A1c worsening at 5.8, with prior levels at 5.6 and 5.7. Due to stage III CKD, I would not recommend Metformin at this time.  2. Stage 3a chronic kidney disease (HCC) Worsening BUN and serum creatinine. GFR is now <60. Extensive education performed. Increased water intake, which pt struggles with. Avoid  nephrotoxic substances.  3. Vitamin D deficiency Vit D level 70.6 and at goal. No need for supplementation at this time.  4. At risk for impaired metabolic function Due to Kendra Day's current state of health and medical condition(s), she is at a significantly higher risk for impaired metabolic function.   At least 9 minutes was spent on counseling Kendra Day about these concerns today.  This places the patient at a much greater risk to subsequently develop cardio-pulmonary conditions that can negatively affect the patient's quality of life.  I stressed the importance of reversing these risks factors.  The initial goal is to lose at least 5-10% of starting weight to help reduce risk factors.  Counseling:  Intensive lifestyle modifications discussed with Kendra Day as the most appropriate first line treatment.  she will continue to work on diet, exercise, and weight loss efforts.  We will continue to reassess these conditions on a fairly regular basis in an attempt to decrease the patient's overall morbidity and mortality.  5. Obesity, current BMI, 31.2 Kendra Day is currently in the action stage of change. As such, her goal is to continue with weight loss efforts. She has agreed to keeping a food journal and adhering to recommended goals of 1300 calories and 120+ grams protein.   Concentrate on water intake and how many ounces per day she take in. Continue to journal.  Exercise goals:  As is  Behavioral modification strategies: emotional eating strategies.  Kendra Day has agreed to follow-up with our clinic in 3-4 weeks. She was informed of the importance of frequent follow-up visits to maximize her  success with intensive lifestyle modifications for her multiple health conditions.   Objective:   Blood pressure 115/70, pulse (!) 59, temperature 98 F (36.7 C), height 5\' 5"  (1.651 m), weight 187 lb (84.8 kg), SpO2 99 %. Body mass index is 31.12 kg/m.  General: Cooperative, alert, well developed, in no  acute distress. HEENT: Conjunctivae and lids unremarkable. Cardiovascular: Regular rhythm.  Lungs: Normal work of breathing. Neurologic: No focal deficits.   Lab Results  Component Value Date   CREATININE 1.12 (H) 06/20/2022   BUN 29 (H) 06/20/2022   NA 138 06/20/2022   K 4.9 06/20/2022   CL 101 06/20/2022   CO2 24 06/20/2022   Lab Results  Component Value Date   ALT 16 12/18/2021   AST 17 12/18/2021   ALKPHOS 115 12/18/2021   BILITOT 0.3 12/18/2021   Lab Results  Component Value Date   HGBA1C 5.8 (H) 06/20/2022   HGBA1C 5.7 (H) 12/18/2021   HGBA1C 5.7 (H) 08/22/2021   HGBA1C 5.6 04/19/2021   Lab Results  Component Value Date   INSULIN 8.3 12/18/2021   INSULIN 6.4 08/22/2021   INSULIN 5.8 04/19/2021   INSULIN 10.8 10/12/2020   Lab Results  Component Value Date   TSH 0.681 02/08/2014   Lab Results  Component Value Date   CHOL 179 12/18/2021   HDL 68 12/18/2021   LDLCALC 95 12/18/2021   TRIG 86 12/18/2021   CHOLHDL 2.6 12/18/2021   Lab Results  Component Value Date   VD25OH 70.6 06/20/2022   VD25OH 85.3 12/18/2021   VD25OH 100.0 08/22/2021   Lab Results  Component Value Date   WBC 7.6 02/21/2014   HGB 14.6 02/21/2014   HCT 41.6 02/21/2014   MCV 85.2 02/21/2014   PLT 218 02/21/2014    Attestation Statements:   Reviewed by clinician on day of visit: allergies, medications, problem list, medical history, surgical history, family history, social history, and previous encounter notes.  I, 02/23/2014, BS, CMA, am acting as transcriptionist for Kyung Rudd, DO.  I have reviewed the above documentation for accuracy and completeness, and I agree with the above. [employer], D.O.  The 21st Century Cures Act was signed into law in 2016 which includes the topic of electronic health records.  This provides immediate access to information in MyChart.  This includes consultation notes, operative notes, office notes, lab results and pathology  reports.  If you have any questions about what you read please let 2017 know at your next visit so we can discuss your concerns and take corrective action if need be.  We are right here with you.

## 2022-08-03 ENCOUNTER — Ambulatory Visit (INDEPENDENT_AMBULATORY_CARE_PROVIDER_SITE_OTHER): Payer: 59 | Admitting: Psychology

## 2022-08-03 DIAGNOSIS — F4321 Adjustment disorder with depressed mood: Secondary | ICD-10-CM

## 2022-08-03 NOTE — Progress Notes (Signed)
[facility] Counselor/Therapist Progress Note  Patient ID: Kendra Day, MRN: [id number],    Date: 08/03/2022  Time Spent: 50 mins  Treatment Type: Individual Therapy  Reported Symptoms: Pt presented for follow up session, via webex video.  Pt grants consent for session, stating she is in her home with no one else present.  I shared with pt that I am in my office with no one else present here either.    Mental Status Exam: Appearance:  Casual     Behavior: Appropriate  Motor: Normal  Speech/Language:  Clear and Coherent  Affect: Appropriate  Mood: normal  Thought process: normal  Thought content:   WNL  Sensory/Perceptual disturbances:   WNL  Orientation: oriented to person, place, and time/date  Attention: Good  Concentration: Good  Memory: WNL  Fund of knowledge:  Good  Insight:   Good  Judgment:  Good  Impulse Control: Good   Risk Assessment: Danger to Self:  No Self-injurious Behavior: No Danger to Others: No Duty to Warn:no Physical Aggression / Violence:No  Access to Firearms a concern: No  Gang Involvement:No   Subjective: Pt shares that "work has been crazy.  We are going to [employer] this weekend for a football game and we are looking forward to seeing Kendra Day; Kendra Day's brother and sister will be there too, with their families."  Pt shares that Kendra Day has waffled and wavered about going so she is still not sure if he will actually end up going.  Pt hopes he will go with them and they can have good conversations with him.  Kendra Day seems to be wishing that he was back in school because all of his friends are in school.  Pt shares that work is busy as it always is this time of year for her.  Kendra Day has officially retired and they are still adjusting to that change.  The whole company feels different to pt and her colleagues and it is a big adjustment for all of them.  Pt shares she has been walking some alone as well as with the dogs, she is listening to  audio books (not reading as much), etc.  Encouraged pt to continue with her self care activities and we will meet in 3 wks for a follow up session.  Interventions: Cognitive Behavioral Therapy  Diagnosis:Adjustment disorder with depressed mood  Plan: Treatment Plan Strengths/Abilities:  Intelligent, Intuitive, Willing to participate in therapy Treatment Preferences:  Outpatient Individual Therapy Statement of Needs:  Patient is to use CBT, mindfulness and coping skills to help manage and/or decrease symptoms associated with their diagnosis. Symptoms:  Depressed/Irritable mood, worry, social withdrawal Problems Addressed:  Depressive thoughts, Sadness, Sleep issues, etc. Long Term Goals:  Pt to reduce overall level, frequency, and intensity of the feelings of depression as evidenced by decreased irritability, negative self talk, and helpless feelings from 6 to 7 days/week to 0 to 1 days/week, per client report, for at least 3 consecutive months.  Progress: 30% Short Term Goals:  Pt to verbally express understanding of the relationship between feelings of depression and their impact on thinking patterns and behaviors.  Pt to verbalize an understanding of the role that distorted thinking plays in creating fears, excessive worry, and ruminations.  Progress: 30% Target Date:  11/23/2022 Frequency:  Monthly Modality:  Cognitive Behavioral Therapy Interventions by Therapist:  Therapist will use CBT, Mindfulness exercises, Coping skills and Referrals, as needed by client. Client has verbally approved this treatment plan.  Karie Kirks, [facility]

## 2022-08-20 ENCOUNTER — Encounter (INDEPENDENT_AMBULATORY_CARE_PROVIDER_SITE_OTHER): Payer: Self-pay | Admitting: Family Medicine

## 2022-08-20 ENCOUNTER — Telehealth (INDEPENDENT_AMBULATORY_CARE_PROVIDER_SITE_OTHER): Payer: 59 | Admitting: Family Medicine

## 2022-08-20 DIAGNOSIS — E860 Dehydration: Secondary | ICD-10-CM

## 2022-08-20 DIAGNOSIS — Z6839 Body mass index (BMI) 39.0-39.9, adult: Secondary | ICD-10-CM

## 2022-08-20 DIAGNOSIS — E669 Obesity, unspecified: Secondary | ICD-10-CM

## 2022-08-20 DIAGNOSIS — F1520 Other stimulant dependence, uncomplicated: Secondary | ICD-10-CM

## 2022-08-20 DIAGNOSIS — R7303 Prediabetes: Secondary | ICD-10-CM | POA: Diagnosis not present

## 2022-08-20 DIAGNOSIS — U071 COVID-19: Secondary | ICD-10-CM | POA: Diagnosis not present

## 2022-08-20 DIAGNOSIS — Z6831 Body mass index (BMI) 31.0-31.9, adult: Secondary | ICD-10-CM

## 2022-08-24 ENCOUNTER — Ambulatory Visit (INDEPENDENT_AMBULATORY_CARE_PROVIDER_SITE_OTHER): Payer: 59 | Admitting: Psychology

## 2022-08-24 DIAGNOSIS — F4321 Adjustment disorder with depressed mood: Secondary | ICD-10-CM | POA: Diagnosis not present

## 2022-08-24 NOTE — Progress Notes (Signed)
[facility] Counselor/Therapist Progress Note  Patient ID: OPIE FANTON, MRN: [id number],    Date: 08/24/2022  Time Spent: 50 mins  Treatment Type: Individual Therapy  Reported Symptoms: Pt presented for follow up session, via webex video.  Pt grants consent for session, stating she is in her office with no one else present.  I shared with pt that I am in my office with no one else present here either.    Mental Status Exam: Appearance:  Casual     Behavior: Appropriate  Motor: Normal  Speech/Language:  Clear and Coherent  Affect: Appropriate  Mood: normal  Thought process: normal  Thought content:   WNL  Sensory/Perceptual disturbances:   WNL  Orientation: oriented to person, place, and time/date  Attention: Good  Concentration: Good  Memory: WNL  Fund of knowledge:  Good  Insight:   Good  Judgment:  Good  Impulse Control: Good   Risk Assessment: Danger to Self:  No Self-injurious Behavior: No Danger to Others: No Duty to Warn:no Physical Aggression / Violence:No  Access to Firearms a concern: No  Gang Involvement:No   Subjective: Pt shares that "I have been OK but I got COVID after being at a music festival with [place].  He had just come back from [place] and we think he may have gotten it from his plane trip back from [place].  The music festival was great, but we could have done without the COVID."  Pt shares that "I felt bad."  She missed two days last week and half of this week; she is still having some symptoms of congestion and a cough.  Pt shares that [name] is doing well; he is interviewing for being a mgr for the [employer] basketball team at [employer] [employer]; he should hear soon whether or not he gets the job.  Pt shares that they enjoyed the football game they went to and it was good to see [name] in his college element.  Pt shares that [name] "is doing OK.  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  Pt shares that she has learned that [name] hears most things as negative or as criticism.  Pt and [name] are trying very hard to communicate effectively so that he is able to hear their true meaning.  Pt shares that [name] has been easier to interact with than he has been in year's past.  Pt continues to work on self care activities: she has been walking some alone as well as with the dogs, she is listening to audio books (not reading as much), watching TV, etc.  Encouraged pt to continue with her self care activities and we will meet in 3 wks for a follow up session.  Interventions: Cognitive Behavioral Therapy  Diagnosis:Adjustment disorder with depressed mood  Plan: Treatment Plan Strengths/Abilities:  Intelligent, Intuitive, Willing to participate in therapy Treatment Preferences:  Outpatient Individual Therapy Statement of Needs:  Patient is to use CBT, mindfulness and coping skills to help manage and/or decrease symptoms associated with their diagnosis. Symptoms:  Depressed/Irritable mood, worry, social withdrawal Problems Addressed:  Depressive thoughts, Sadness, Sleep issues, etc. Long Term Goals:  Pt to reduce overall level, frequency, and intensity of the feelings of depression as evidenced by decreased irritability, negative self talk, and helpless feelings from 6 to 7 days/week to 0 to  1 days/week, per client report, for at least 3 consecutive months.  Progress: 30% Short Term Goals:  Pt to verbally express understanding of the relationship between feelings of depression and their impact on thinking patterns and behaviors.  Pt to verbalize an understanding of the role that distorted thinking plays in creating fears, excessive worry, and ruminations.  Progress: 30% Target Date:  11/23/2022 Frequency:  Monthly Modality:  Cognitive Behavioral Therapy Interventions by Therapist:  Therapist will use CBT, Mindfulness exercises, Coping skills  and Referrals, as needed by client. Client has verbally approved this treatment plan.  Ivan Anchors, [facility]

## 2022-08-24 NOTE — Progress Notes (Unsigned)
TeleHealth Visit:  Due to the COVID-19 pandemic, this visit was completed with telemedicine (audio/video) technology to reduce patient and provider exposure as well as to preserve personal protective equipment.   Kendra Day has verbally consented to this TeleHealth visit. The patient is located at home, the provider is located at the [facility] office. The participants in this visit include the listed provider and patient. The visit was conducted today via video.   Chief Complaint: OBESITY Kendra Day is here to discuss her progress with her obesity treatment plan along with follow-up of her obesity related diagnoses. Kendra Day is on keeping a food journal and adhering to recommended goals of 1300 calories and 120+ grams protein and states she is following her eating plan approximately 50% of the time. Kendra Day states she is not currently exercising.  Today's visit was #: 26 Starting weight: 239 lbs Starting date: 10/12/2020  Interim History: Kendra Day has COVID, with all the classic symptoms that started 4 days ago. Her husband was positive 3 days prior to her. Since her last OV, pt increased her water intake but still drinks 1-2 cups of coffee and average of 3 diet Dr. Malachi Bonds. Prior to illness, pt was walking 4-5 days a week for 30 minutes. She was also journaling 1300-1400 calories and 100+ grams of protein before getting sick.  Subjective:   1. Pre-diabetes Kendra Day denies hunger or cravings because nothing tastes good right now with COVID.  2. Caffeine dependence, continuous (HCC) She drinks 3-4 cans of diet Dr. Malachi Bonds per day and 2 cups of coffee on average.  3. COVID, acute infection It is day 4 of COVID and pt is still testing grossly positive at home.  4. Mild dehydration Pt's serum creatinine and BUN are elevated from prior. Her labs showed possible inadequate fluid intake may be contributing. She is getting 80 oz of water in per day.  Assessment/Plan:  No  orders of the defined types were placed in this encounter.   There are no discontinued medications.   No orders of the defined types were placed in this encounter.    1. Pre-diabetes Kendra Day will continue to work on weight loss, exercise, and decreasing simple carbohydrates to help decrease the risk of diabetes. I reiterated to pt that her A1c was slightly worse than prior. She needs to follow prudent nutritional plan and increase exercise and weight loss.  2. Caffeine dependence, continuous (HCC) Decrease caffeine intake slowly over time to avoid symptoms/ headaches. Goal will be 2 servings a day.  3. COVID, acute infection Counseling done. Pt will work from home today and tomorrow. She will return to work on day 7 with mask.  4. Mild dehydration Pt is doing much better with water intake. Continue to have a goal of 1/2 her weight  in ounces of water per day.  5. Obesity, current BMI, 31.1 Kendra Day is currently in the action stage of change. As such, her goal is to continue with weight loss efforts. She has agreed to keeping a food journal and adhering to recommended goals of 1300 calories and 120+ grams protein.   Exercise goals:  As is, for now with COVID.  Behavioral modification strategies: no skipping meals and planning for success.  Kendra Day has agreed to follow-up with our clinic in 3-4 weeks. She was informed of the importance of frequent follow-up visits to maximize her success with intensive lifestyle modifications for her multiple health conditions.  Objective:   VITALS: Per patient if applicable, see vitals. GENERAL:  Alert and in no acute distress. CARDIOPULMONARY: No increased WOB. Speaking in clear sentences.  PSYCH: Pleasant and cooperative. Speech normal rate and rhythm. Affect is appropriate. Insight and judgement are appropriate. Attention is focused, linear, and appropriate.  NEURO: Oriented as arrived to appointment on time with no prompting.   Lab Results   Component Value Date   CREATININE 1.12 (H) 06/20/2022   BUN 29 (H) 06/20/2022   NA 138 06/20/2022   K 4.9 06/20/2022   CL 101 06/20/2022   CO2 24 06/20/2022   Lab Results  Component Value Date   ALT 16 12/18/2021   AST 17 12/18/2021   ALKPHOS 115 12/18/2021   BILITOT 0.3 12/18/2021   Lab Results  Component Value Date   HGBA1C 5.8 (H) 06/20/2022   HGBA1C 5.7 (H) 12/18/2021   HGBA1C 5.7 (H) 08/22/2021   HGBA1C 5.6 04/19/2021   Lab Results  Component Value Date   INSULIN 8.3 12/18/2021   INSULIN 6.4 08/22/2021   INSULIN 5.8 04/19/2021   INSULIN 10.8 10/12/2020   Lab Results  Component Value Date   TSH 0.681 02/08/2014   Lab Results  Component Value Date   CHOL 179 12/18/2021   HDL 68 12/18/2021   LDLCALC 95 12/18/2021   TRIG 86 12/18/2021   CHOLHDL 2.6 12/18/2021   Lab Results  Component Value Date   VD25OH 70.6 06/20/2022   VD25OH 85.3 12/18/2021   VD25OH 100.0 08/22/2021   Lab Results  Component Value Date   WBC 7.6 02/21/2014   HGB 14.6 02/21/2014   HCT 41.6 02/21/2014   MCV 85.2 02/21/2014   PLT 218 02/21/2014   No results found for: "IRON", "TIBC", "FERRITIN"  Attestation Statements:   Reviewed by clinician on day of visit: allergies, medications, problem list, medical history, surgical history, family history, social history, and previous encounter notes.  I, Kathlene November, BS, CMA, am acting as transcriptionist for [employer], DO.   I have reviewed the above documentation for accuracy and completeness, and I agree with the above. Kendra Day, D.O.  The [place] was signed into law in 2016 which includes the topic of electronic health records.  This provides immediate access to information in MyChart.  This includes consultation notes, operative notes, office notes, lab results and pathology reports.  If you have any questions about what you read please let us know at your next visit so we can discuss your concerns and  take corrective action if need be.  We are right here with you.

## 2022-08-28 ENCOUNTER — Ambulatory Visit
Admission: RE | Admit: 2022-08-28 | Discharge: 2022-08-28 | Disposition: A | Discharge status: Home / Self Care | Payer: 59 | Source: Ambulatory Visit

## 2022-08-28 DIAGNOSIS — Z1231 Encounter for screening mammogram for malignant neoplasm of breast: Secondary | ICD-10-CM

## 2022-09-17 ENCOUNTER — Ambulatory Visit (INDEPENDENT_AMBULATORY_CARE_PROVIDER_SITE_OTHER): Payer: 59 | Admitting: Family Medicine

## 2022-09-21 ENCOUNTER — Ambulatory Visit: Payer: 59 | Admitting: Psychology

## 2022-09-28 ENCOUNTER — Ambulatory Visit (INDEPENDENT_AMBULATORY_CARE_PROVIDER_SITE_OTHER): Payer: 59 | Admitting: Psychology

## 2022-09-28 DIAGNOSIS — F4321 Adjustment disorder with depressed mood: Secondary | ICD-10-CM | POA: Diagnosis not present

## 2022-09-28 NOTE — Progress Notes (Signed)
[facility] Counselor/Therapist Progress Note  Patient ID: Kendra Day, MRN: [id number],    Date: 09/28/2022  Time Spent: 50 mins  Treatment Type: Individual Therapy  Reported Symptoms: Pt presented for follow up session, via webex video.  Pt grants consent for session, stating she is in her home with no one else present.  I shared with pt that I am in my office with no one else present here either.    Mental Status Exam: Appearance:  Casual     Behavior: Appropriate  Motor: Normal  Speech/Language:  Clear and Coherent  Affect: Appropriate  Mood: normal  Thought process: normal  Thought content:   WNL  Sensory/Perceptual disturbances:   WNL  Orientation: oriented to person, place, and time/date  Attention: Good  Concentration: Good  Memory: WNL  Fund of knowledge:  Good  Insight:   Good  Judgment:  Good  Impulse Control: Good   Risk Assessment: Danger to Self:  No Self-injurious Behavior: No Danger to Others: No Duty to Warn:no Physical Aggression / Violence:No  Access to Firearms a concern: No  Gang Involvement:No   Subjective: Pt shares that "I have been pretty good since our last session.  It is a crazy time of the year for [name] at work but it is going OK."  Pt shares that Ulice Dash has continued to be demanding and trying to manage how she cooks dinner for the family.  She has tried to to tell him no but he just seems that he will never stop doing it.  This situation makes pt not want to continue cooking.  Pt shares, "I am at a loss as to how to change his behavior in this regard; it is so frustrating for me."  It is difficult to get Ulice Dash to talk with them "because he never has time to talk."  Talked over different ways to encourage Ulice Dash to change his behavior towards pt and Herbie [place].  Encouraged pt to talk with Herbie [place] about supporting pt by helping hold Ulice Dash accountable to the rules they set for him.  Encouraged pt to continue with her self care activities and  we will meet in 3 wks for a follow up session.  Ask pt about [name]'s behavior choices and the client who was aggressive with her for no apparent reason today.  Interventions: Cognitive Behavioral Therapy  Diagnosis:Adjustment disorder with depressed mood  Plan: Treatment Plan Strengths/Abilities:  Intelligent, Intuitive, Willing to participate in therapy Treatment Preferences:  Outpatient Individual Therapy Statement of Needs:  Patient is to use CBT, mindfulness and coping skills to help manage and/or decrease symptoms associated with their diagnosis. Symptoms:  Depressed/Irritable mood, worry, social withdrawal Problems Addressed:  Depressive thoughts, Sadness, Sleep issues, etc. Long Term Goals:  Pt to reduce overall level, frequency, and intensity of the feelings of depression as evidenced by decreased irritability, negative self talk, and helpless feelings from 6 to 7 days/week to 0 to 1 days/week, per client report, for at least 3 consecutive months.  Progress: 30% Short Term Goals:  Pt to verbally express understanding of the relationship between feelings of depression and their impact on thinking patterns and behaviors.  Pt to verbalize an understanding of the role that distorted thinking plays in creating fears, excessive worry, and ruminations.  Progress: 30% Target Date:  11/23/2022 Frequency:  Monthly Modality:  Cognitive Behavioral Therapy Interventions by Therapist:  Therapist will use CBT, Mindfulness exercises, Coping skills and Referrals, as needed by client. Client has verbally approved this treatment  plan.  Ivan Anchors, [facility]

## 2022-10-11 ENCOUNTER — Ambulatory Visit (INDEPENDENT_AMBULATORY_CARE_PROVIDER_SITE_OTHER): Payer: 59 | Admitting: Family Medicine

## 2022-10-18 ENCOUNTER — Ambulatory Visit (INDEPENDENT_AMBULATORY_CARE_PROVIDER_SITE_OTHER): Payer: 59 | Admitting: Psychology

## 2022-10-18 DIAGNOSIS — F4321 Adjustment disorder with depressed mood: Secondary | ICD-10-CM | POA: Diagnosis not present

## 2022-10-18 NOTE — Progress Notes (Signed)
[facility] Counselor/Therapist Progress Note  Patient ID: Kendra Day, MRN: [id number],    Date: 10/18/2022  Time Spent: 50 mins  Treatment Type: Individual Therapy  Reported Symptoms: Pt presented for follow up session, via webex video.  Pt grants consent for session, stating she is in her home with no one else present.  I shared with pt that I am in my office with no one else present here either.    Mental Status Exam: Appearance:  Casual     Behavior: Appropriate  Motor: Normal  Speech/Language:  Clear and Coherent  Affect: Appropriate  Mood: normal  Thought process: normal  Thought content:   WNL  Sensory/Perceptual disturbances:   WNL  Orientation: oriented to person, place, and time/date  Attention: Good  Concentration: Good  Memory: WNL  Fund of knowledge:  Good  Insight:   Good  Judgment:  Good  Impulse Control: Good   Risk Assessment: Danger to Self:  No Self-injurious Behavior: No Danger to Others: No Duty to Warn:no Physical Aggression / Violence:No  Access to Firearms a concern: No  Gang Involvement:No   Subjective: Pt shares that "I have been pretty good since our last session.  Work is still crazy at work and the drama at home continues with [name]."  Pt shares that she has continued to be frustrated with [name] and him not doing the things she wants/needs him to do.  Pt shares that they had a huge drama this week about 4 pairs of pants with [name].  Pt shares that she and [name] "were still fuming about the incident the next day and [name] was completely unaffected by the event.  Talked with pt about how to choose different interactions with [name].  Appreciated pt's ability to see that [name] is not effected the same way as she is or the way [name] is.   Encouraged pt to continue with her self care activities and we will meet in 3 wks for a follow up session.  Interventions: Cognitive Behavioral Therapy  Diagnosis:Adjustment disorder with depressed  mood  Plan: Treatment Plan Strengths/Abilities:  Intelligent, Intuitive, Willing to participate in therapy Treatment Preferences:  Outpatient Individual Therapy Statement of Needs:  Patient is to use CBT, mindfulness and coping skills to help manage and/or decrease symptoms associated with their diagnosis. Symptoms:  Depressed/Irritable mood, worry, social withdrawal Problems Addressed:  Depressive thoughts, Sadness, Sleep issues, etc. Long Term Goals:  Pt to reduce overall level, frequency, and intensity of the feelings of depression as evidenced by decreased irritability, negative self talk, and helpless feelings from 6 to 7 days/week to 0 to 1 days/week, per client report, for at least 3 consecutive months.  Progress: 30% Short Term Goals:  Pt to verbally express understanding of the relationship between feelings of depression and their impact on thinking patterns and behaviors.  Pt to verbalize an understanding of the role that distorted thinking plays in creating fears, excessive worry, and ruminations.  Progress: 30% Target Date:  11/23/2022 Frequency:  Monthly Modality:  Cognitive Behavioral Therapy Interventions by Therapist:  Therapist will use CBT, Mindfulness exercises, Coping skills and Referrals, as needed by client. Client has verbally approved this treatment plan.  Karie Kirks, [facility]

## 2022-10-22 ENCOUNTER — Ambulatory Visit (INDEPENDENT_AMBULATORY_CARE_PROVIDER_SITE_OTHER): Payer: 59 | Admitting: Family Medicine

## 2022-10-22 ENCOUNTER — Encounter (INDEPENDENT_AMBULATORY_CARE_PROVIDER_SITE_OTHER): Payer: Self-pay | Admitting: Family Medicine

## 2022-10-22 ENCOUNTER — Ambulatory Visit: Payer: Self-pay | Admitting: Internal Medicine

## 2022-10-22 VITALS — BP 135/81 | HR 60 | Temp 98.0°F | Ht 65.0 in | Wt 188.0 lb

## 2022-10-22 DIAGNOSIS — F39 Unspecified mood [affective] disorder: Secondary | ICD-10-CM | POA: Diagnosis not present

## 2022-10-22 DIAGNOSIS — Z6838 Body mass index (BMI) 38.0-38.9, adult: Secondary | ICD-10-CM

## 2022-10-22 DIAGNOSIS — E66812 Obesity, class 2: Secondary | ICD-10-CM

## 2022-10-22 DIAGNOSIS — E669 Obesity, unspecified: Secondary | ICD-10-CM | POA: Diagnosis not present

## 2022-10-22 MED ORDER — BUPROPION HCL ER (SR) 100 MG PO TB12: 100.0000 mg | ORAL_TABLET | Freq: Every day | ORAL | 0 refills | Status: DC

## 2022-10-22 NOTE — Progress Notes (Signed)
Office: [phone]  /  Fax: [phone]    Date: 10/29/2022   Appointment Start Time: 9:01am Duration: 37 minutes Provider: Lawerance Cruel, Psy.D. Type of Session: Intake for Individual Therapy  Location of Patient: Work (private location) Location of Provider: Provider's home (private office) Type of Contact: Telepsychological Visit via MyChart Video Visit  Informed Consent: Prior to proceeding with today's appointment, two pieces of identifying information were obtained. In addition, Eliette's physical location at the time of this appointment was obtained as well a phone number she could be reached at in the event of technical difficulties. Meilin and this provider participated in today's telepsychological service.   The provider's role was explained to Catalina Antigua. The provider reviewed and discussed issues of confidentiality, privacy, and limits therein (e.g., reporting obligations). In addition to verbal informed consent, written informed consent for psychological services was obtained prior to the initial appointment. Since the clinic is not a 24/7 crisis center, mental health emergency resources were shared and this  provider explained MyChart, e-mail, voicemail, and/or other messaging systems should be utilized only for non-emergency reasons. This provider also explained that information obtained during appointments will be placed in Denia's medical record and relevant information will be shared with other providers at [facility] for coordination of care. Pinki agreed information may be shared with other [facility] providers as needed for coordination of care and by signing the service agreement document, she provided written consent for coordination of care. Prior to initiating telepsychological services, Darbie completed an informed consent document, which included the development of a safety plan (i.e., an emergency contact and emergency  resources) in the event of an emergency/crisis. Marijah verbally acknowledged understanding she is ultimately responsible for understanding her insurance benefits for telepsychological and in-person services. This provider also reviewed confidentiality, as it relates to telepsychological services. Raney  acknowledged understanding that appointments cannot be recorded without both party consent and she is aware she is responsible for securing confidentiality on her end of the session. Dory verbally consented to proceed.  Chief Complaint/HPI: Mikiya was referred by Dr. Thomasene Lot on October 22, 2022. Evann started with the clinic in November 2021.   During today's appointment, [name] shared, "I've had a lot going on personally." She explained her 60 year old son returned home in May 2023, and she described their relationship as "fairly difficult." The aforementioned has resulted in emotional eating behaviors. She was verbally administered a questionnaire assessing various behaviors related to emotional eating behaviors. Nadra endorsed the following: overeat when you are celebrating, experience food cravings on a regular basis, eat certain foods when you are anxious, stressed, depressed, or your feelings are hurt, use food to help you cope with emotional situations, find food is comforting to you, overeat when you are angry or upset, overeat when you are worried about something, and eat as a reward. She shared she craves sweets (e.g., cookies, cake, candy, chocolate, ice cream). Judie believes the onset of emotional eating behaviors was likely in childhood, and described the current frequency of emotional eating behaviors as daily. In addition, Nyiesha denied a history of binge eating behaviors. Charmayne denied a history of significantly restricting food intake, purging and engagement in other compensatory strategies, and has never been diagnosed with an eating disorder. She also denied  a history of treatment for emotional eating. Currently, Anorah indicated she is unsure what makes emotional eating behaviors better. Furthermore, Lauralyn shared her husband has "significant food allergies," which has made it  more challenging.   Mental Status Examination:  Appearance: neat Behavior: appropriate to circumstances Mood: neutral Affect: mood congruent Speech: WNL Eye Contact: appropriate Psychomotor Activity: WNL Gait: unable to assess  Thought Process: linear, logical, and goal directed and denies suicidal, homicidal, and self-harm ideation, plan and intent  Thought Content/Perception: no hallucinations, delusions, bizarre thinking or behavior endorsed or observed Orientation: AAOx4 Memory/Concentration: memory, attention, language, and fund of knowledge intact  Insight/Judgment: fair  Family & Psychosocial History: Claris CheMargaret reported she is married and she has two children (ages 7920 and 218). 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 Additionally, Claris CheMargaret shared her highest level of education obtained is a bachelor's degree. Currently, Hajer's social support system consists of her husband, mother, best work friend, and exercise friend. Moreover, Claris CheMargaret stated she resides with her husband, son, and two dogs.   Medical History:  Past Medical History:  Diagnosis Date   Adult acne    Anemia    Arthritis of carpometacarpal (CMC) joint of thumb    Edema of both lower extremities    Hydronephrosis    Infertility, female    Joint pain    Kidney problem    Knee pain    Numbness in both hands    Prediabetes    Reflux    Past Surgical History:  Procedure Laterality Date   CESAREAN SECTION     renal reflux     Current Outpatient Medications on File Prior to Visit  Medication Sig Dispense Refill   buPROPion ER (WELLBUTRIN SR) 100 MG 12 hr tablet Take 1 tablet (100 mg total) by mouth daily. With brkfst 30 tablet 0   fexofenadine  (ALLEGRA) 180 MG tablet Take 180 mg by mouth daily.     montelukast (SINGULAIR) 10 MG tablet Take 10 mg by mouth at bedtime.     omeprazole (PRILOSEC) 20 MG capsule Take 20 mg by mouth daily.     No current facility-administered medications on file prior to visit.  Claris CheMargaret stated she is medication compliant.   Mental Health History: Claris CheMargaret reported she started meeting with Maggie FontKeith Cottle with [facility] around 2021, noting she was attending family therapy during 2020. She indicated the focus of treatment currently is family dynamics and ongoing stressors, noting they meet every three weeks. Davetta agreed to sign an authorization for coordination of care if deemed necessary. She stated she was recently prescribed Wellbutrin by Dr. [name] Holsterpalski. Claris CheMargaret reported there is no history of hospitalizations for psychiatric concerns. Claris CheMargaret endorsed a family history of substance abuse (paternal side), possible bipolar (paternal uncle), and bipolar (sister). Furthermore, Claris CheMargaret reported there is no history of trauma including psychological, physical , and sexual abuse, as well as neglect.   Claris CheMargaret described her typical mood lately as "okay." Aside from concerns noted above and endorsed on the PHQ-9 and GAD-7, Claris CheMargaret reported she likely has "undiagnosed ADD," noting she is easily distracted, forgetful, and will hyperfocus. Shellye endorsed consuming 1-2 standard alcohol beverages appropriately two times a week. She denied tobacco use. She denied illicit/recreational substance use. Furthermore, Claris CheMargaret indicated she is not experiencing the following: hallucinations and delusions, paranoia, symptoms of mania , social withdrawal, crying spells, panic attacks, memory concerns, and obsessions and compulsions. She also denied history of and current suicidal ideation, plan, and intent; history of and current homicidal ideation, plan, and intent; and history of and current engagement in  self-harm.  Legal History: Claris CheMargaret reported there is no history of legal involvement.   Structured  Assessments Results: The Patient Health Questionnaire-9 (PHQ-9) is a self-report measure that assesses symptoms and severity of depression over the course of the last two weeks. Palin obtained a score of 4 suggesting minimal depression. Jenalyn finds the endorsed symptoms to be not difficult at all. [0= Not at all; 1= Several days; 2= More than half the days; 3= Nearly every day] Little interest or pleasure in doing things 0  Feeling down, depressed, or hopeless 0  Trouble falling or staying asleep, or sleeping too much 2  Feeling tired or having little energy 1  Poor appetite or overeating 1  Feeling bad about yourself --- or that you are a failure or have let yourself or your family down 0  Trouble concentrating on things, such as reading the newspaper or watching television 0  Moving or speaking so slowly that other people could have noticed? Or the opposite --- being so fidgety or restless that you have been moving around a lot more than usual 0  Thoughts that you would be better off dead or hurting yourself in some way 0  PHQ-9 Score 4    The Generalized Anxiety Disorder-7 (GAD-7) is a brief self-report measure that assesses symptoms of anxiety over the course of the last two weeks. Aira obtained a score of 5 suggesting mild anxiety. Donisha finds the endorsed symptoms to be somewhat difficult. [0= Not at all; 1= Several days; 2= Over half the days; 3= Nearly every day] Feeling nervous, anxious, on edge 1  Not being able to stop or control worrying 1  Worrying too much about different things- "I'm a worrier. I come from a family of worriers." She reported worry about son's well-being, work, life and forgetting to get things done resulting in her waking in the middle of the night to write reminders. 2  Trouble relaxing 1  Being so restless that it's hard to sit still 0  Becoming  easily annoyed or irritable 0  Feeling afraid as if something awful might happen 0  GAD-7 Score 5   Interventions:  Conducted a chart review Focused on rapport building Verbally administered PHQ-9 and GAD-7 for symptom monitoring Verbally administered Food & Mood questionnaire to assess various behaviors related to emotional eating Provided emphatic reflections and validation Collaborated with patient on a treatment goal  Psychoeducation provided regarding physical versus emotional hunger  Diagnostic Impressions & Provisional DSM-5 Diagnosis(es): Dollie reported a history of engagement in emotional eating behaviors starting in childhood, and described the current frequency as daily. She denied engagement in any other disordered eating behaviors. She also endorsed items on the GAD-7 and described herself as a "worrier" and described her worry as generalized. Additionally, Inice described ongoing challenges since her oldest son returned home in May of this year. Based on the aforementioned, the following diagnoses were assigned: F50.89 Other Specified Feeding or Eating Disorder, Emotional Eating Behaviors, F41.1 Generalized Anxiety Disorder, and F43.20 Adjustment Disorder, Unspecified. 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 A specifier of "Unspecified" was noted given the limited scope of this appointment and this provider's role with the clinic. Moreover, Orine shared she can become easily distracted, forgetful, and will hyperfocus. Given the limited scope of this appointment and this provider's role with the clinic, it was recommended she discuss further with her primary therapist.   Plan: Kay appears able and willing to participate as evidenced by collaboration  on a treatment goal, engagement in reciprocal conversation, and asking questions as needed  for clarification. The next appointment is scheduled for 11/12/2022 at 8am, which will be via MyChart Video Visit. The following treatment goal was established: increase coping skills. This provider will regularly review the treatment plan and medical chart to keep informed of status changes. Marika expressed understanding and agreement with the initial treatment plan of care. Cayla will be sent a handout via e-mail to utilize between now and the next appointment to increase awareness of hunger patterns and subsequent eating. Kirby provided verbal consent during today's appointment for this provider to send the handout via e-mail. Additionally, Annalei will continue with her primary therapist.

## 2022-10-29 ENCOUNTER — Telehealth (INDEPENDENT_AMBULATORY_CARE_PROVIDER_SITE_OTHER): Payer: 59 | Admitting: Psychology

## 2022-10-29 DIAGNOSIS — F5089 Other specified eating disorder: Secondary | ICD-10-CM | POA: Diagnosis not present

## 2022-10-29 DIAGNOSIS — F4320 Adjustment disorder, unspecified: Secondary | ICD-10-CM | POA: Diagnosis not present

## 2022-10-29 DIAGNOSIS — F411 Generalized anxiety disorder: Secondary | ICD-10-CM

## 2022-11-05 DIAGNOSIS — F39 Unspecified mood [affective] disorder: Secondary | ICD-10-CM | POA: Insufficient documentation

## 2022-11-05 NOTE — Progress Notes (Addendum)
Chief Complaint:   OBESITY Kendra Day is here to discuss her progress with her obesity treatment plan along with follow-up of her obesity related diagnoses. Kendra Day is on keeping a food journal and adhering to recommended goals of 1300 calories and 120+ protein and states she is following her eating plan approximately 10% of the time. Kendra Day states she is walking 30 minutes 3-4 times per week.  Today's visit was #: 27 Starting weight: 239 lbs Starting date: 10/12/2020 Today's weight: 188 lbs Today's date: 10/22/2022 Total lbs lost to date: 51 lbs Total lbs lost since last in-office visit: +1 lb  Interim History: Kendra Day is here for a follow up office visit.  We reviewed her meal plan and all questions were answered.  Patient's food recall appears to be accurate and consistent with what is on plan when she is following it.   When eating on plan, her hunger and cravings are well controlled.   Ever since having Covid in early September.  Food is just not tasting well.  Also, a lot of family stressors (71 year old son moved back into home the last couple of months), which causes a lot of emotional eating.   Subjective:   1. Mood disorder (HCC), with emotional eating She has more stress with her son in the house again.  Seeing her counselor every 2 weeks.  But more emotional eating lately and would like additional support.    Assessment/Plan:  No orders of the defined types were placed in this encounter.   There are no discontinued medications.   Meds ordered this encounter  Medications   buPROPion ER (WELLBUTRIN SR) 100 MG 12 hr tablet    Sig: Take 1 tablet (100 mg total) by mouth daily. With brkfst    Dispense:  30 tablet    Refill:  0     1. Mood disorder (HCC), with emotional eating Continue with patient's personal counselor every two weeks.  Patient requests Dr Dewaine Conger referral to discuss emotional eating strategies.   START- buPROPion ER (WELLBUTRIN SR) 100  MG 12 hr tablet; Take 1 tablet (100 mg total) by mouth daily. With brkfst  Dispense: 30 tablet; Refill: 0  2. Obesity, current BMI 38.5 Goal is to not gain for next office visit thorough out the holidays.  Handout on mindful eating given to patient.  Kendra Day is currently in the action stage of change. As such, her goal is to continue with weight loss efforts. She has agreed to keeping a food journal and adhering to recommended goals of 1300 calories and 120+ protein.   Exercise goals:  As is.   Behavioral modification strategies: emotional eating strategies, holiday eating strategies , and planning for success.  Kendra Day has agreed to follow-up with our clinic in 4 weeks. She was informed of the importance of frequent follow-up visits to maximize her success with intensive lifestyle modifications for her multiple health conditions.   Objective:   Blood pressure 135/81, pulse 60, temperature 98 F (36.7 C), height 5\' 5"  (1.651 m), weight 188 lb (85.3 kg), SpO2 99 %. Body mass index is 31.28 kg/m.  General: Cooperative, alert, well developed, in no acute distress. HEENT: Conjunctivae and lids unremarkable. Cardiovascular: Regular rhythm.  Lungs: Normal work of breathing. Neurologic: No focal deficits.   Lab Results  Component Value Date   CREATININE 1.12 (H) 06/20/2022   BUN 29 (H) 06/20/2022   NA 138 06/20/2022   K 4.9 06/20/2022   CL 101 06/20/2022  CO2 24 06/20/2022   Lab Results  Component Value Date   ALT 16 12/18/2021   AST 17 12/18/2021   ALKPHOS 115 12/18/2021   BILITOT 0.3 12/18/2021   Lab Results  Component Value Date   HGBA1C 5.8 (H) 06/20/2022   HGBA1C 5.7 (H) 12/18/2021   HGBA1C 5.7 (H) 08/22/2021   HGBA1C 5.6 04/19/2021   Lab Results  Component Value Date   INSULIN 8.3 12/18/2021   INSULIN 6.4 08/22/2021   INSULIN 5.8 04/19/2021   INSULIN 10.8 10/12/2020   Lab Results  Component Value Date   TSH 0.681 02/08/2014   Lab Results  Component Value  Date   CHOL 179 12/18/2021   HDL 68 12/18/2021   LDLCALC 95 12/18/2021   TRIG 86 12/18/2021   CHOLHDL 2.6 12/18/2021   Lab Results  Component Value Date   VD25OH 70.6 06/20/2022   VD25OH 85.3 12/18/2021   VD25OH 100.0 08/22/2021   Lab Results  Component Value Date   WBC 7.6 02/21/2014   HGB 14.6 02/21/2014   HCT 41.6 02/21/2014   MCV 85.2 02/21/2014   PLT 218 02/21/2014   No results found for: "IRON", "TIBC", "FERRITIN"  Attestation Statements:   Reviewed by clinician on day of visit: allergies, medications, problem list, medical history, surgical history, family history, social history, and previous encounter notes.  I, Malcolm Metro, RMA, am acting as Energy manager for [employer], DO.   I have reviewed the above documentation for accuracy and completeness, and I agree with the above. [name], D.O.  The 21st Century Cures Act was signed into law in 2016 which includes the topic of electronic health records.  This provides immediate access to information in MyChart.  This includes consultation notes, operative notes, office notes, lab results and pathology reports.  If you have any questions about what you read please let us know at your next visit so we can discuss your concerns and take corrective action if need be.  We are right here with you.

## 2022-11-09 ENCOUNTER — Ambulatory Visit (INDEPENDENT_AMBULATORY_CARE_PROVIDER_SITE_OTHER): Payer: 59 | Admitting: Psychology

## 2022-11-09 DIAGNOSIS — F4321 Adjustment disorder with depressed mood: Secondary | ICD-10-CM

## 2022-11-09 NOTE — Progress Notes (Signed)
[place] Counselor/Therapist Progress Note  Patient ID: Kendra Day, MRN: [id number],    Date: 11/09/2022  Time Spent: 50 mins  Treatment Type: Individual Therapy  Reported Symptoms: Pt presented for follow up session, via webex video.  Pt grants consent for session, stating she is in her home with no one else present.  I shared with pt that I am in my office with no one else present here either.    Mental Status Exam: Appearance:  Casual     Behavior: Appropriate  Motor: Normal  Speech/Language:  Clear and Coherent  Affect: Appropriate  Mood: normal  Thought process: normal  Thought content:   WNL  Sensory/Perceptual disturbances:   WNL  Orientation: oriented to person, place, and time/date  Attention: Good  Concentration: Good  Memory: WNL  Fund of knowledge:  Good  Insight:   Good  Judgment:  Good  Impulse Control: Good   Risk Assessment: Danger to Self:  No Self-injurious Behavior: No Danger to Others: No Duty to Warn:no Physical Aggression / Violence:No  Access to Firearms a concern: No  Gang Involvement:No   Subjective: Pt shares that "I have been pretty good since our last session.  Thanksgiving was quiet because lots of family members were sick, including Marcello Moores."  They are going to pick him up for Winter break on Monday.  He has done well in his classes; he has a girlfriend that he met this semester and he seems to be happy.  Pt shares that "Ulice Dash is OK; he had a situation over Thanksgiving where he got into some trouble.  He was arrested and was likely under the influence of alcohol.  He got really scared by the situation which was not a bad thing.  He has had to get an attorney and has to pay for that himself.  He has been good about working a lot since the event to earn money to pay the attorney."  Pt shares that Ulice Dash continues to struggle with being responsible; pt wants to hold him accountable and Herbie Baltimore wants to be more lenient with him.   He has a court date 11/22/22; he has an attorney who will be there for him on that date.  He is charged with assault on a Engineer, structural, resisting arrest, and trespassing.  Pt shares that "we have had some work drama; we had to let someone go because she was not doing her job.  That has been stressful as well."  Encouraged pt to continue with her self care activities and we will meet in 3 wks for a follow up session.  Interventions: Cognitive Behavioral Therapy  Diagnosis:Adjustment disorder with depressed mood  Plan: Treatment Plan Strengths/Abilities:  Intelligent, Intuitive, Willing to participate in therapy Treatment Preferences:  Outpatient Individual Therapy Statement of Needs:  Patient is to use CBT, mindfulness and coping skills to help manage and/or decrease symptoms associated with their diagnosis. Symptoms:  Depressed/Irritable mood, worry, social withdrawal Problems Addressed:  Depressive thoughts, Sadness, Sleep issues, etc. Long Term Goals:  Pt to reduce overall level, frequency, and intensity of the feelings of depression as evidenced by decreased irritability, negative self talk, and helpless feelings from 6 to 7 days/week to 0 to 1 days/week, per client report, for at least 3 consecutive months.  Progress: 30% Short Term Goals:  Pt to verbally express understanding of the relationship between feelings of depression and their impact on thinking patterns and behaviors.  Pt to verbalize an understanding of the role that  distorted thinking plays in creating fears, excessive worry, and ruminations.  Progress: 30% Target Date:  11/23/2022 Frequency:  Monthly Modality:  Cognitive Behavioral Therapy Interventions by Therapist:  Therapist will use CBT, Mindfulness exercises, Coping skills and Referrals, as needed by client. Client has verbally approved this treatment plan.  Ivan Anchors, [facility]

## 2022-11-12 ENCOUNTER — Telehealth (INDEPENDENT_AMBULATORY_CARE_PROVIDER_SITE_OTHER): Payer: 59 | Admitting: Psychology

## 2022-11-12 DIAGNOSIS — F411 Generalized anxiety disorder: Secondary | ICD-10-CM

## 2022-11-12 DIAGNOSIS — F4320 Adjustment disorder, unspecified: Secondary | ICD-10-CM

## 2022-11-12 DIAGNOSIS — F5089 Other specified eating disorder: Secondary | ICD-10-CM | POA: Diagnosis not present

## 2022-11-12 NOTE — Progress Notes (Signed)
  Office: [phone]  /  Fax: [phone]    Date: November 12, 2022    Appointment Start Time: 8:01am Duration: 30 minutes Provider: Lawerance Cruel, Psy.D. Type of Session: Individual Therapy  Location of Patient: Home (private location) Location of Provider: Provider's Home (private office) Type of Contact: Telepsychological Visit via MyChart Video Visit  Session Content: Kendra Day is a 51 y.o. female presenting for a follow-up appointment to address the previously established treatment goal of increasing coping skills.Today's appointment was a telepsychological visit. Kendra Day provided verbal consent for today's telepsychological appointment and she is aware she is responsible for securing confidentiality on her end of the session. Prior to proceeding with today's appointment, Kendra Day's physical location at the time of this appointment was obtained as well a phone number she could be reached at in the event of technical difficulties. Kendra Day and this provider participated in today's telepsychological service.   This provider conducted a brief check-in. Kendra Day stated, "Still kind of busy at work." She discussed challenges with her eating habits last week. Further explored and processed. She acknowledged "poor planning" is playing a role. This provider and Kendra Day explored what helped her initially lose weight. She was also engaged in problem solving to help her eat congruent to her goals. Kendra Day agreed to try the following: develop designated areas in the kitchen and pantry; leave food at work for easy access to reduce eating out; be mindful of water intake; and double recipes. Overall, Kendra Day was receptive to today's appointment as evidenced by openness to sharing, responsiveness to feedback, and willingness to implement discussed strategies .  Mental Status Examination:  Appearance: neat Behavior: appropriate to circumstances Mood: neutral Affect: mood congruent Speech: WNL Eye  Contact: appropriate Psychomotor Activity: WNL Gait: unable to assess Thought Process: linear, logical, and goal directed and no evidence or endorsement of suicidal, homicidal, and self-harm ideation, plan and intent  Thought Content/Perception: no hallucinations, delusions, bizarre thinking or behavior endorsed or observed Orientation: AAOx4 Memory/Concentration: memory, attention, language, and fund of knowledge intact  Insight: fair Judgment: fair  Interventions:  Conducted a brief chart review Provided empathic reflections and validation Employed supportive psychotherapy interventions to facilitate reduced distress and to improve coping skills with identified stressors Engaged patient in problem solving  DSM-5 Diagnosis(es):  F50.89 Other Specified Feeding or Eating Disorder, Emotional Eating Behaviors and F41.1 Generalized Anxiety Disorder, and F43.20 Adjustment Disorder, Unspecified  Treatment Goal & Progress: During the initial appointment with this provider, the following treatment goal was established: increase coping skills. Progress is limited, as Kendra Day has just begun treatment with this provider; however, she is receptive to the interaction and interventions and rapport is being established.   Plan: The next appointment is scheduled for 11/27/2022 at 8am, which will be via MyChart Video Visit. The next session will focus on working towards the established treatment goal.

## 2022-11-18 ENCOUNTER — Other Ambulatory Visit (INDEPENDENT_AMBULATORY_CARE_PROVIDER_SITE_OTHER): Payer: Self-pay | Admitting: Family Medicine

## 2022-11-18 DIAGNOSIS — F39 Unspecified mood [affective] disorder: Secondary | ICD-10-CM

## 2022-11-20 ENCOUNTER — Encounter (INDEPENDENT_AMBULATORY_CARE_PROVIDER_SITE_OTHER): Payer: Self-pay | Admitting: Family Medicine

## 2022-11-20 ENCOUNTER — Ambulatory Visit (INDEPENDENT_AMBULATORY_CARE_PROVIDER_SITE_OTHER): Payer: 59 | Admitting: Family Medicine

## 2022-11-20 VITALS — BP 137/85 | HR 64 | Temp 97.9°F | Ht 65.0 in | Wt 189.8 lb

## 2022-11-20 DIAGNOSIS — R7989 Other specified abnormal findings of blood chemistry: Secondary | ICD-10-CM | POA: Diagnosis not present

## 2022-11-20 DIAGNOSIS — E669 Obesity, unspecified: Secondary | ICD-10-CM

## 2022-11-20 DIAGNOSIS — Z6831 Body mass index (BMI) 31.0-31.9, adult: Secondary | ICD-10-CM

## 2022-11-20 DIAGNOSIS — R7303 Prediabetes: Secondary | ICD-10-CM

## 2022-11-20 DIAGNOSIS — F39 Unspecified mood [affective] disorder: Secondary | ICD-10-CM | POA: Diagnosis not present

## 2022-11-20 DIAGNOSIS — Z8639 Personal history of other endocrine, nutritional and metabolic disease: Secondary | ICD-10-CM | POA: Insufficient documentation

## 2022-11-20 MED ORDER — BUPROPION HCL ER (SR) 150 MG PO TB12: 150.0000 mg | ORAL_TABLET | Freq: Every day | ORAL | 0 refills | Status: DC

## 2022-11-27 ENCOUNTER — Telehealth (INDEPENDENT_AMBULATORY_CARE_PROVIDER_SITE_OTHER): Payer: 59 | Admitting: Psychology

## 2022-11-30 ENCOUNTER — Ambulatory Visit (INDEPENDENT_AMBULATORY_CARE_PROVIDER_SITE_OTHER): Payer: 59 | Admitting: Psychology

## 2022-11-30 DIAGNOSIS — F4321 Adjustment disorder with depressed mood: Secondary | ICD-10-CM | POA: Diagnosis not present

## 2022-11-30 NOTE — Progress Notes (Signed)
Behavioral Health Counselor/Therapist Progress Note  Patient ID: Kendra Day, MRN: [id number],    Date: 11/30/2022  Time Spent: 50 mins  Treatment Type: Individual Therapy  Reported Symptoms: Pt presented for follow up session, via webex video.  Pt grants consent for session, stating she is in her home with no one else present.  I shared with pt that I am in my office with no one else present here either.    Mental Status Exam: Appearance:  Casual     Behavior: Appropriate  Motor: Normal  Speech/Language:  Clear and Coherent  Affect: Appropriate  Mood: normal  Thought process: normal  Thought content:   WNL  Sensory/Perceptual disturbances:   WNL  Orientation: oriented to person, place, and time/date  Attention: Good  Concentration: Good  Memory: WNL  Fund of knowledge:  Good  Insight:   Good  Judgment:  Good  Impulse Control: Good   Risk Assessment: Danger to Self:  No Self-injurious Behavior: No Danger to Others: No Duty to Warn:no Physical Aggression / Violence:No  Access to Firearms a concern: No  Gang Involvement:No   Subjective: Pt shares that "I have been pretty good since our last session.  Christmas was pretty quiet.  My family came in two days ago and that was nice."  Pt shares that she and [name] will be going to [facility] in Feb to see a concert and will see a [employer] show as well.  [name]'s brother and is wife are going to be there as well and they will do some things together while they are there.  [name] got home and was sick (mono and a sinus infection).  He made [employer] this past semester and pt is proud of him.  He had a great time and enjoyed his time there.  Pt shares that [name] is doing OK; he seems to be anxious about trying to get back into school, maybe doing an online class to start with, probably at [employer].  They want [name] to drive the bus of decision making and making the decision happen.  Pt shares that [name]'s court charges have been  separated and the new dates in Jan will not require [name] to be present.  His attorney will represent him for both of those.  Pt shares that work has been really busy for her over the past 3 months.  Pt has a new assistant that pt is having to train how to do the work that needs to be done.  Pt has learned that [name] (former Psychologist, sport and exercise) has improved significantly of late and she hopes he will continue to improve and be able to enjoy his retirement.  Pt shares that she is reading a book and she is enjoying that activity.  It has to do with Iona Coach that she enjoys.  She also enjoyed the holiday gatherings.  Encouraged pt to continue with her self care activities and we will meet in 3 wks for a follow up session.  Interventions: Cognitive Behavioral Therapy  Diagnosis:Adjustment disorder with depressed mood  Plan: Treatment Plan Strengths/Abilities:  Intelligent, Intuitive, Willing to participate in therapy Treatment Preferences:  Outpatient Individual Therapy Statement of Needs:  Patient is to use CBT, mindfulness and coping skills to help manage and/or decrease symptoms associated with their diagnosis. Symptoms:  Depressed/Irritable mood, worry, social withdrawal Problems Addressed:  Depressive thoughts, Sadness, Sleep issues, etc. Long Term Goals:  Pt to reduce overall level, frequency, and intensity of the feelings of depression  as evidenced by decreased irritability, negative self talk, and helpless feelings from 6 to 7 days/week to 0 to 1 days/week, per client report, for at least 3 consecutive months.  Progress: 30% Short Term Goals:  Pt to verbally express understanding of the relationship between feelings of depression and their impact on thinking patterns and behaviors.  Pt to verbalize an understanding of the role that distorted thinking plays in creating fears, excessive worry, and ruminations.  Progress: 30% Target Date:  11/24/2023 Frequency:  Monthly Modality:  Cognitive Behavioral  Therapy Interventions by Therapist:  Therapist will use CBT, Mindfulness exercises, Coping skills and Referrals, as needed by client. Client has verbally approved this treatment plan.  Karie Kirks, [facility]

## 2022-12-10 ENCOUNTER — Telehealth (INDEPENDENT_AMBULATORY_CARE_PROVIDER_SITE_OTHER): Payer: 59 | Admitting: Psychology

## 2022-12-10 DIAGNOSIS — F4320 Adjustment disorder, unspecified: Secondary | ICD-10-CM

## 2022-12-10 DIAGNOSIS — F411 Generalized anxiety disorder: Secondary | ICD-10-CM

## 2022-12-10 DIAGNOSIS — F5089 Other specified eating disorder: Secondary | ICD-10-CM

## 2022-12-10 NOTE — Progress Notes (Signed)
  Office: [phone]  /  Fax: [phone]    Date: December 10, 2022    Appointment Start Time: 11:31am Duration: 20 minutes Provider: Glennie Isle, Psy.D. Type of Session: Individual Therapy  Location of Patient: Home (private location) Location of Provider: Provider's Home (private office) Type of Contact: Telepsychological Visit via MyChart Video Visit  Session Content: Kendra Day is a 52 y.o. female presenting for a follow-up appointment to address the previously established treatment goal of increasing coping skills.Today's appointment was a telepsychological visit. Kendra Day provided verbal consent for today's telepsychological appointment and she is aware she is responsible for securing confidentiality on her end of the session. Prior to proceeding with today's appointment, Kendra Day's physical location at the time of this appointment was obtained as well a phone number she could be reached at in the event of technical difficulties. Kendra Day and this provider participated in today's telepsychological service.   This provider conducted a brief check-in. Kendra Day reported feeling she has been "running a million miles an hour." She discussed challenges with eating during the holidays, noting she is working to get back on track. Psychoeducation regarding triggers for emotional eating was provided. Kendra Day was provided a handout, and encouraged to utilize the handout between now and the next appointment to increase awareness of triggers and frequency. Kendra Day agreed. This provider also discussed behavioral strategies for specific triggers, such as placing the utensil down when conversing to avoid mindless eating. Kendra Day provided verbal consent during today's appointment for this provider to send a handout about triggers via e-mail. Overall, Kendra Day was receptive to today's appointment as evidenced by openness to sharing, responsiveness to feedback, and willingness to explore triggers for  emotional eating.  Mental Status Examination:  Appearance: neat Behavior: appropriate to circumstances Mood: anxious Affect: mood congruent Speech: WNL Eye Contact: appropriate Psychomotor Activity: WNL Gait: unable to assess Thought Process: linear, logical, and goal directed and no evidence or endorsement of suicidal, homicidal, and self-harm ideation, plan and intent  Thought Content/Perception: no hallucinations, delusions, bizarre thinking or behavior endorsed or observed Orientation: AAOx4 Memory/Concentration: memory, attention, language, and fund of knowledge intact  Insight: fair Judgment: fair  Interventions:  Conducted a brief chart review Provided empathic reflections and validation Employed supportive psychotherapy interventions to facilitate reduced distress and to improve coping skills with identified stressors Psychoeducation provided regarding triggers for emotional eating behaviors  DSM-5 Diagnosis(es):  F50.89 Other Specified Feeding or Eating Disorder, Emotional Eating Behaviors and F41.1 Generalized Anxiety Disorder, and F43.20 Adjustment Disorder, Unspecified  Treatment Goal & Progress: During the initial appointment with this provider, the following treatment goal was established: increase coping skills. Kendra Day has demonstrated progress in her goal as evidenced by increased awareness of hunger patterns. Kendra Day also continues to demonstrate willingness to engage in learned skill(s).  Plan: The next appointment is scheduled for 12/24/2022 at 11am, which will be via MyChart Video Visit. The next session will focus on working towards the established treatment goal. Kendra Day will continue with her primary therapist.

## 2022-12-14 LAB — BASIC METABOLIC PANEL
BUN/Creatinine Ratio: 28 — ABNORMAL HIGH (ref 9–23)
BUN: 30 mg/dL — ABNORMAL HIGH (ref 6–24)
CO2: 24 mmol/L (ref 20–29)
Calcium: 9.9 mg/dL (ref 8.7–10.2)
Chloride: 102 mmol/L (ref 96–106)
Creatinine, Ser: 1.08 mg/dL — ABNORMAL HIGH (ref 0.57–1.00)
Glucose: 95 mg/dL (ref 70–99)
Potassium: 5.2 mmol/L (ref 3.5–5.2)
Sodium: 140 mmol/L (ref 134–144)
eGFR: 61 mL/min/{1.73_m2} (ref >59)

## 2022-12-14 LAB — HEMOGLOBIN A1C
Est. average glucose Bld gHb Est-mCnc: 120 mg/dL
Hgb A1c MFr Bld: 5.8 % — ABNORMAL HIGH (ref 4.8–5.6)

## 2022-12-14 LAB — VITAMIN D 25 HYDROXY (VIT D DEFICIENCY, FRACTURES): Vit D, 25-Hydroxy: 49.5 ng/mL (ref 30.0–100.0)

## 2022-12-14 LAB — INSULIN, RANDOM: INSULIN: 9.2 u[IU]/mL (ref 2.6–24.9)

## 2022-12-18 NOTE — Progress Notes (Signed)
Chief Complaint:   OBESITY Kendra Day is here to discuss her progress with her obesity treatment plan along with follow-up of her obesity related diagnoses. Kendra Day is on keeping a food journal and adhering to recommended goals of 1300 calories and 120+ protein and states she is following her eating plan approximately 10% of the time. Kendra Day states she is walking 30 minutes 1-2 times per week.  Today's visit was #: 5 Starting weight: 53 LBS Starting date: 10/12/2020 Today's weight: 189 LBS Today's date: 11/20/2022 Total lbs lost to date: 50 LBS Total lbs lost since last in-office visit: +1 LBS  Interim History: Appointment 1 month ago. Kendra Day is here for a follow up office visit.  We reviewed her meal plan and all questions were answered.  Patient's food recall appears to be accurate and consistent with what is on plan when she is following it.   When eating on plan, her hunger and cravings are well controlled.    Subjective:   1. History of vitamin D deficiency Patient has not had any supplementation x 1 year.  Last last vitamin D level it was in the upper 70s with no medications.  2. Elevated serum creatinine Noted about 5 months ago.  Still drinking a lot of caffeine and about 50 ounces of water.  3. Prediabetes A1c 5.8 couple months ago.  4. Mood disorder (Kendra Day), with emotional eating Patient sees Dr. Mallie Mussel every 2 weeks and was started on Wellbutrin and tolerating well.  Medication helps a little with emotional eating but is interested in increasing dose.  Assessment/Plan:   Orders Placed This Encounter  Procedures   Hemoglobin A1c   Insulin, random   VITAMIN D 25 Hydroxy (Vit-D Deficiency, Fractures)   Basic Metabolic Panel (BMET)    Medications Discontinued During This Encounter  Medication Reason   buPROPion ER (WELLBUTRIN SR) 100 MG 12 hr tablet Reorder     Meds ordered this encounter  Medications   DISCONTD: buPROPion (WELLBUTRIN SR) 150  MG 12 hr tablet    Sig: Take 1 tablet (150 mg total) by mouth daily. With brkfst    Dispense:  30 tablet    Refill:  0     1. History of vitamin D deficiency Recheck vitamin D.  Continue PNP and weight loss.  - VITAMIN D 25 Hydroxy (Vit-D Deficiency, Fractures)  2. Elevated serum creatinine Decrease caffeine intake by half of current, recheck labs next office visit, BMP with GFR, patient declines today.  Increase water intake.  - Basic Metabolic Panel (BMET)  3. Prediabetes Recheck A1c and fasting insulin.  Continue PNP, decrease simple carbs, increase protein and fiber.  - Hemoglobin A1c - Insulin, random  4. Mood disorder ([place]), with emotional eating Risk and benefits of medication discussed with patient.  She understands she can take 8+ weeks to reach full increase dose to 150 mg, up from 100 mg.  Increase- buPROPion (WELLBUTRIN SR) 150 MG 12 hr tablet; Take 1 tablet (150 mg total) by mouth daily. With brkfst  Dispense: 30 tablet; Refill: 0  5. Obesity, current BMI 31.6 Holiday eating strategies discussed with patient.  Kendra Day is currently in the action stage of change. As such, her goal is to continue with weight loss efforts. She has agreed to keeping a food journal and adhering to recommended goals of 1300 calories and 120+ protein.   Exercise goals:  Increase activity as tolerated.  Behavioral modification strategies: holiday eating strategies  and planning for success.  Kendra Day has agreed to follow-up with our clinic in 3-4 weeks. She was informed of the importance of frequent follow-up visits to maximize her success with intensive lifestyle modifications for her multiple health conditions.   Kendra Day was informed we would discuss her lab results at her next visit unless there is a critical issue that needs to be addressed sooner. Kendra Day agreed to keep her next visit at the agreed upon time to discuss these results.  Objective:   Blood pressure 137/85, pulse 64,  temperature 97.9 F (36.6 C), height 5\' 5"  (1.651 m), weight 189 lb 12.8 oz (86.1 kg), SpO2 100 %. Body mass index is 31.58 kg/m.  General: Cooperative, alert, well developed, in no acute distress. HEENT: Conjunctivae and lids unremarkable. Cardiovascular: Regular rhythm.  Lungs: Normal work of breathing. Neurologic: No focal deficits.   Lab Results  Component Value Date   CREATININE 1.08 (H) 12/13/2022   BUN 30 (H) 12/13/2022   NA 140 12/13/2022   K 5.2 12/13/2022   CL 102 12/13/2022   CO2 24 12/13/2022   Lab Results  Component Value Date   ALT 16 12/18/2021   AST 17 12/18/2021   ALKPHOS 115 12/18/2021   BILITOT 0.3 12/18/2021   Lab Results  Component Value Date   HGBA1C 5.8 (H) 12/13/2022   HGBA1C 5.8 (H) 06/20/2022   HGBA1C 5.7 (H) 12/18/2021   HGBA1C 5.7 (H) 08/22/2021   HGBA1C 5.6 04/19/2021   Lab Results  Component Value Date   INSULIN 9.2 12/13/2022   INSULIN 8.3 12/18/2021   INSULIN 6.4 08/22/2021   INSULIN 5.8 04/19/2021   INSULIN 10.8 10/12/2020   Lab Results  Component Value Date   TSH 0.681 02/08/2014   Lab Results  Component Value Date   CHOL 179 12/18/2021   HDL 68 12/18/2021   LDLCALC 95 12/18/2021   TRIG 86 12/18/2021   CHOLHDL 2.6 12/18/2021   Lab Results  Component Value Date   VD25OH 49.5 12/13/2022   VD25OH 70.6 06/20/2022   VD25OH 85.3 12/18/2021   Lab Results  Component Value Date   WBC 7.6 02/21/2014   HGB 14.6 02/21/2014   HCT 41.6 02/21/2014   MCV 85.2 02/21/2014   PLT 218 02/21/2014   No results found for: "IRON", "TIBC", "FERRITIN"  Attestation Statements:   Reviewed by clinician on day of visit: allergies, medications, problem list, medical history, surgical history, family history, social history, and previous encounter notes.  I, Davy Pique, RMA, am acting as Location manager for [employer], DO.  I have reviewed the above documentation for accuracy and completeness, and I agree with the above. Marjory Sneddon, D.O.  [place] was signed into law in 2016 which includes the topic of electronic health records.  This provides immediate access to information in MyChart.  This includes consultation notes, operative notes, office notes, lab results and pathology reports.  If you have any questions about what you read please let us know at your next visit so we can discuss your concerns and take corrective action if need be.  We are right here with you.

## 2022-12-19 ENCOUNTER — Encounter (INDEPENDENT_AMBULATORY_CARE_PROVIDER_SITE_OTHER): Payer: Self-pay | Admitting: Family Medicine

## 2022-12-19 ENCOUNTER — Ambulatory Visit (INDEPENDENT_AMBULATORY_CARE_PROVIDER_SITE_OTHER): Payer: 59 | Admitting: Family Medicine

## 2022-12-19 VITALS — BP 124/85 | HR 69 | Temp 97.6°F | Ht 65.0 in | Wt 191.4 lb

## 2022-12-19 DIAGNOSIS — F39 Unspecified mood [affective] disorder: Secondary | ICD-10-CM | POA: Diagnosis not present

## 2022-12-19 DIAGNOSIS — Z6831 Body mass index (BMI) 31.0-31.9, adult: Secondary | ICD-10-CM

## 2022-12-19 DIAGNOSIS — E669 Obesity, unspecified: Secondary | ICD-10-CM | POA: Diagnosis not present

## 2022-12-19 MED ORDER — BUPROPION HCL ER (SR) 150 MG PO TB12: 150.0000 mg | ORAL_TABLET | Freq: Every day | ORAL | 0 refills | Status: DC

## 2022-12-20 ENCOUNTER — Other Ambulatory Visit (INDEPENDENT_AMBULATORY_CARE_PROVIDER_SITE_OTHER): Payer: Self-pay | Admitting: Family Medicine

## 2022-12-20 DIAGNOSIS — F39 Unspecified mood [affective] disorder: Secondary | ICD-10-CM

## 2022-12-21 ENCOUNTER — Ambulatory Visit (INDEPENDENT_AMBULATORY_CARE_PROVIDER_SITE_OTHER): Payer: 59 | Admitting: Psychology

## 2022-12-21 DIAGNOSIS — F4321 Adjustment disorder with depressed mood: Secondary | ICD-10-CM | POA: Diagnosis not present

## 2022-12-21 NOTE — Progress Notes (Signed)
Kendra Day Counselor/Therapist Progress Note  Patient ID: Kendra Day, MRN: [id number],    Date: 12/21/2022  Time Spent: 45 mins  Treatment Type: Individual Therapy  Reported Symptoms: Pt presented for follow up session, via webex video.  Pt grants consent for session, stating she is in her home with no one else present.  I shared with pt that I am in my office with no one else present here either.    Mental Status Exam: Appearance:  Casual     Behavior: Appropriate  Motor: Normal  Speech/Language:  Clear and Coherent  Affect: Appropriate  Mood: normal  Thought process: normal  Thought content:   WNL  Sensory/Perceptual disturbances:   WNL  Orientation: oriented to person, place, and time/date  Attention: Good  Concentration: Good  Memory: WNL  Fund of knowledge:  Good  Insight:   Good  Judgment:  Good  Impulse Control: Good   Risk Assessment: Danger to Self:  No Self-injurious Behavior: No Danger to Others: No Duty to Warn:no Physical Aggression / Violence:No  Access to Firearms a concern: No  Gang Involvement:No   Subjective: Pt shares that "We have had a hard week.  [name]'s brother-in-law (sister's husband) passed away unexpectedly on 16-Mar-2023.  His sister has a neurologic condition and her health is really not good."  Pt shares they are having to make arrangements for for is sister's ongoing care.  Herbie Baltimore has one brother as well who has been to also been to [place] to support their sister.   Pt also shares that she and Herbie Baltimore took [place] back to [employer] on Sunday and "it was such a joy to have him here for the holidays and I am so thankful for that."  This event has caused pt and Herbie Baltimore to be very clear about their final wishes and tell each other where all of their financial info is and who is responsible for what.  [name]'s brother was the COO of the [employer] works for so the owners of the company have told him to do anything he needs to do  to support his sister.  Encouraged pt and Herbie Baltimore to be very intentional about engaging in their self care activities and to continue to be very supportive of one another.  We will meet in 3 wks for a follow up session, due to me being out of town in 2 wks.  Interventions: Cognitive Behavioral Therapy  Diagnosis:Adjustment disorder with depressed mood  Plan: Treatment Plan Strengths/Abilities:  Intelligent, Intuitive, Willing to participate in therapy Treatment Preferences:  Outpatient Individual Therapy Statement of Needs:  Patient is to use CBT, mindfulness and coping skills to help manage and/or decrease symptoms associated with their diagnosis. Symptoms:  Depressed/Irritable mood, worry, social withdrawal Problems Addressed:  Depressive thoughts, Sadness, Sleep issues, etc. Long Term Goals:  Pt to reduce overall level, frequency, and intensity of the feelings of depression as evidenced by decreased irritability, negative self talk, and helpless feelings from 6 to 7 days/week to 0 to 1 days/week, per client report, for at least 3 consecutive months.  Progress: 30% Short Term Goals:  Pt to verbally express understanding of the relationship between feelings of depression and their impact on thinking patterns and behaviors.  Pt to verbalize an understanding of the role that distorted thinking plays in creating fears, excessive worry, and ruminations.  Progress: 30% Target Date:  11/24/2023 Frequency:  Monthly Modality:  Cognitive Behavioral Therapy Interventions by Therapist:  Therapist will use CBT, Mindfulness exercises,  Coping skills and Referrals, as needed by client. Client has verbally approved this treatment plan.  Kendra Day, [facility]

## 2022-12-24 ENCOUNTER — Telehealth (INDEPENDENT_AMBULATORY_CARE_PROVIDER_SITE_OTHER): Payer: 59 | Admitting: Psychology

## 2022-12-24 DIAGNOSIS — F5089 Other specified eating disorder: Secondary | ICD-10-CM

## 2022-12-24 DIAGNOSIS — F411 Generalized anxiety disorder: Secondary | ICD-10-CM | POA: Diagnosis not present

## 2022-12-24 DIAGNOSIS — F4320 Adjustment disorder, unspecified: Secondary | ICD-10-CM

## 2022-12-24 NOTE — Progress Notes (Signed)
  Office: [phone]  /  Fax: [phone]    Date: December 24, 2022    Appointment Start Time: 11:00am Duration: 25 minutes Provider: Glennie Isle, Psy.D. Type of Session: Individual Therapy  Location of Patient: Work (private location) Location of Provider: Provider's Home (private office) Type of Contact: Telepsychological Visit via MyChart Video Visit  Session Content: Kendra Day is a 52 y.o. female presenting for a follow-up appointment to address the previously established treatment goal of increasing coping skills.Today's appointment was a telepsychological visit. Kendra Day provided verbal consent for today's telepsychological appointment and she is aware she is responsible for securing confidentiality on her end of the session. Prior to proceeding with today's appointment, Kendra Day's physical location at the time of this appointment was obtained as well a phone number she could be reached at in the event of technical difficulties. Kendra Day and this provider participated in today's telepsychological service.   This provider conducted a brief check-in. Kendra Day shared about a recent loss, noting it was unexpected. She acknowledged the aforementioned impacted her eating habits last week, adding she is "starting fresh" today. Associated thoughts and feelings were processed. She discussed a reduction in soda intake and an increase in water intake. Positive reinforcement was provided. Reviewed triggers for emotional eating behaviors. Kendra Day was engaged in problem solving to develop a plan to help cope with urges/cravings involving activities to relax, activities to distract, comforting places, people to call and connect with, and activities that help soothe senses. She was observed writing the plan. Overall, Kendra Day was receptive to today's appointment as evidenced by openness to sharing, responsiveness to feedback, and willingness to implement discussed strategies .  Mental Status Examination:   Appearance: neat Behavior: appropriate to circumstances Mood: neutral Affect: mood congruent Speech: WNL Eye Contact: appropriate Psychomotor Activity: WNL Gait: unable to assess Thought Process: linear, logical, and goal directed and no evidence or endorsement of suicidal, homicidal, and self-harm ideation, plan and intent  Thought Content/Perception: no hallucinations, delusions, bizarre thinking or behavior endorsed or observed Orientation: AAOx4 Memory/Concentration: memory, attention, language, and fund of knowledge intact  Insight: fair Judgment: fair  Interventions:  Conducted a brief chart review Provided empathic reflections and validation Reviewed content from the previous session Provided positive reinforcement Employed supportive psychotherapy interventions to facilitate reduced distress and to improve coping skills with identified stressors Engaged patient in problem solving  DSM-5 Diagnosis(es):  F50.89 Other Specified Feeding or Eating Disorder, Emotional Eating Behaviors and F41.1 Generalized Anxiety Disorder, and F43.20 Adjustment Disorder, Unspecified  Treatment Goal & Progress: During the initial appointment with this provider, the following treatment goal was established: increase coping skills. Kendra Day has demonstrated progress in her goal as evidenced by increased awareness of hunger patterns and increased awareness of triggers for emotional eating behaviors. Kendra Day also continues to demonstrate willingness to engage in learned skill(s).  Plan: The next appointment is scheduled for 01/07/2023 at 8:30am, which will be via MyChart Video Visit. The next session will focus on working towards the established treatment goal. Kendra Day will continue with her primary therapist.

## 2023-01-07 ENCOUNTER — Telehealth (INDEPENDENT_AMBULATORY_CARE_PROVIDER_SITE_OTHER): Payer: 59 | Admitting: Psychology

## 2023-01-07 DIAGNOSIS — F5089 Other specified eating disorder: Secondary | ICD-10-CM

## 2023-01-07 DIAGNOSIS — F4320 Adjustment disorder, unspecified: Secondary | ICD-10-CM

## 2023-01-07 DIAGNOSIS — F411 Generalized anxiety disorder: Secondary | ICD-10-CM

## 2023-01-07 NOTE — Progress Notes (Unsigned)
Chief Complaint:   OBESITY Kendra Day is here to discuss her progress with her obesity treatment plan along with follow-up of her obesity related diagnoses. Kendra Day is on keeping a food journal and adhering to recommended goals of 1300 calories and 120+ grams of protein and states she is following her eating plan approximately 0% of the time. Kendra Day states she is walking for 20-30 minutes 3 times per week.  Today's visit was #: 55 Starting weight: 239 lbs Starting date: 10/12/2020 Today's weight: 191 lbs Today's date: 12/19/2022 Total lbs lost to date: 48 Total lbs lost since last in-office visit: 0  Interim History: Kendra Day did decrease her caffeine intake by 1/2 as per her goal last office visit. Down to 2 beverages per day. Eating better but not tracking. Her goal is to eat better and get into "skinnier jeans".  Not really journaling much.  Subjective:   1. Mood disorder ([place]), with emotional eating Last office visit, we increased her Wellbutrin. She feels her mood has improved; less food noise. No side effects were noted. Less emotional eating despite increased stressors. No issues with sleep.   Assessment/Plan:  No orders of the defined types were placed in this encounter.   Medications Discontinued During This Encounter  Medication Reason   buPROPion (WELLBUTRIN SR) 150 MG 12 hr tablet Reorder     Meds ordered this encounter  Medications   DISCONTD: buPROPion (WELLBUTRIN SR) 150 MG 12 hr tablet    Sig: Take 1 tablet (150 mg total) by mouth daily. With brkfst    Dispense:  30 tablet    Refill:  0     1. Mood disorder ([place]), with emotional eating We will refill Wellbutrin for 1 month. Continue and increase exercise as tolerated. Continue prudent nutritional plan. Stress management and self care were discussed. Continue with Dr. Mallie Mussel for cognitive behavioral therapy.   - buPROPion (WELLBUTRIN SR) 150 MG 12 hr tablet; Take 1 tablet (150 mg total) by mouth daily.  With brkfst  Dispense: 30 tablet; Refill: 0  2. Obesity, current BMI 31.9 Kendra Day is currently in the action stage of change. As such, her goal is to continue with weight loss efforts. She has agreed to the Category 2 Plan or journal.   Patient's next goal for the next office visit is to not gain weight and make healthier choices due to stressors in her life now.   Exercise goals: As is, increase as tolerated.   Behavioral modification strategies: increasing lean protein intake and decreasing simple carbohydrates.  Kendra Day has agreed to follow-up with our clinic in 3 to 4 weeks. She was informed of the importance of frequent follow-up visits to maximize her success with intensive lifestyle modifications for her multiple health conditions.   Objective:   Blood pressure 124/85, pulse 69, temperature 97.6 F (36.4 C), height 5' 5"$  (1.651 m), weight 191 lb 6.4 oz (86.8 kg), SpO2 99 %. Body mass index is 31.85 kg/m.  General: Cooperative, alert, well developed, in no acute distress. HEENT: Conjunctivae and lids unremarkable. Cardiovascular: Regular rhythm.  Lungs: Normal work of breathing. Neurologic: No focal deficits.   Lab Results  Component Value Date   CREATININE 1.08 (H) 12/13/2022   BUN 30 (H) 12/13/2022   NA 140 12/13/2022   K 5.2 12/13/2022   CL 102 12/13/2022   CO2 24 12/13/2022   Lab Results  Component Value Date   ALT 16 12/18/2021   AST 17 12/18/2021   ALKPHOS 115 12/18/2021  BILITOT 0.3 12/18/2021   Lab Results  Component Value Date   HGBA1C 5.8 (H) 12/13/2022   HGBA1C 5.8 (H) 06/20/2022   HGBA1C 5.7 (H) 12/18/2021   HGBA1C 5.7 (H) 08/22/2021   HGBA1C 5.6 04/19/2021   Lab Results  Component Value Date   INSULIN 9.2 12/13/2022   INSULIN 8.3 12/18/2021   INSULIN 6.4 08/22/2021   INSULIN 5.8 04/19/2021   INSULIN 10.8 10/12/2020   Lab Results  Component Value Date   TSH 0.681 02/08/2014   Lab Results  Component Value Date   CHOL 179 12/18/2021    HDL 68 12/18/2021   LDLCALC 95 12/18/2021   TRIG 86 12/18/2021   CHOLHDL 2.6 12/18/2021   Lab Results  Component Value Date   VD25OH 49.5 12/13/2022   VD25OH 70.6 06/20/2022   VD25OH 85.3 12/18/2021   Lab Results  Component Value Date   WBC 7.6 02/21/2014   HGB 14.6 02/21/2014   HCT 41.6 02/21/2014   MCV 85.2 02/21/2014   PLT 218 02/21/2014   No results found for: "IRON", "TIBC", "FERRITIN"  Attestation Statements:   Reviewed by clinician on day of visit: allergies, medications, problem list, medical history, surgical history, family history, social history, and previous encounter notes.   Wilhemena Durie, am acting as transcriptionist for [employer], DO.   I have reviewed the above documentation for accuracy and completeness, and I agree with the above. Marjory Sneddon, D.O.  The [place] was signed into law in 2016 which includes the topic of electronic health records.  This provides immediate access to information in MyChart.  This includes consultation notes, operative notes, office notes, lab results and pathology reports.  If you have any questions about what you read please let us know at your next visit so we can discuss your concerns and take corrective action if need be.  We are right here with you.

## 2023-01-07 NOTE — Progress Notes (Signed)
  Office: [phone]  /  Fax: [phone]    Date: January 07, 2023    Appointment Start Time: 8:31am Duration: 19 minutes Provider: Glennie Isle, Psy.D. Type of Session: Individual Therapy  Location of Patient: Home (private location) Location of Provider: Provider's Home (private office) Type of Contact: Telepsychological Visit via MyChart Video Visit  Session Content: Kendra Day is a 52 y.o. female presenting for a follow-up appointment to address the previously established treatment goal of increasing coping skills.Today's appointment was a telepsychological visit. [name] provided verbal consent for today's telepsychological appointment and she is aware she is responsible for securing confidentiality on her end of the session. Prior to proceeding with today's appointment, [name]'s physical location at the time of this appointment was obtained as well a phone number she could be reached at in the event of technical difficulties. [name] and this provider participated in today's telepsychological service.   This provider conducted a brief check-in. [name] shared, "Things have been pretty good overall." She acknowledged some instances of "snacking" and discussed implementing the previously developed plan. Positive reinforcement was provided. Notably, [name] discussed feeling she is often focused on meeting calorie/protein goals resulting in her not recognizing/focusing on feeling satisfied with her food intake. Further explored and processed. Psychoeducation regarding mindfulness was provided. A handout was provided to [name] with further information regarding mindfulness, including exercises. This provider also explained the benefit of mindfulness as it relates to emotional eating. [name] was encouraged to engage in the provided exercises between now and the next appointment with this provider. [name] agreed. During today's appointment, [name] was led through a mindfulness  exercise involving her senses. [name] provided verbal consent during today's appointment for this provider to send a handout about mindfulness via e-mail. Overall, [name] was receptive to today's appointment as evidenced by openness to sharing, responsiveness to feedback, and willingness to continue engaging in mindfulness exercises.  Mental Status Examination:  Appearance: neat Behavior: appropriate to circumstances Mood: neutral Affect: mood congruent Speech: WNL Eye Contact: appropriate Psychomotor Activity: WNL Gait: unable to assess Thought Process: linear, logical, and goal directed and no evidence or endorsement of suicidal, homicidal, and self-harm ideation, plan and intent  Thought Content/Perception: no hallucinations, delusions, bizarre thinking or behavior endorsed or observed Orientation: AAOx4 Memory/Concentration: memory, attention, language, and fund of knowledge intact  Insight: fair Judgment: fair  Interventions:  Conducted a brief chart review Provided empathic reflections and validation Reviewed content from the previous session Provided positive reinforcement Employed supportive psychotherapy interventions to facilitate reduced distress and to improve coping skills with identified stressors Psychoeducation provided regarding mindfulness Engaged patient in mindfulness exercise(s)  DSM-5 Diagnosis(es):  F50.89 Other Specified Feeding or Eating Disorder, Emotional Eating Behaviors and F41.1 Generalized Anxiety Disorder, and F43.20 Adjustment Disorder, Unspecified  Treatment Goal & Progress: During the initial appointment with this provider, the following treatment goal was established: increase coping skills. [name] has demonstrated progress in her goal as evidenced by increased awareness of hunger patterns, increased awareness of triggers for emotional eating behaviors, and reduction in emotional eating behaviors . [name] also continues to demonstrate  willingness to engage in learned skill(s).  Plan: The next appointment is scheduled for 01/22/2023 at 8:30am, which will be via MyChart Video Visit. The next session will focus on working towards the established treatment goal. [name] will continue with her primary therapist.

## 2023-01-11 ENCOUNTER — Ambulatory Visit (INDEPENDENT_AMBULATORY_CARE_PROVIDER_SITE_OTHER): Payer: 59 | Admitting: Psychology

## 2023-01-11 DIAGNOSIS — F4321 Adjustment disorder with depressed mood: Secondary | ICD-10-CM

## 2023-01-11 LAB — LIPID PANEL
Cholesterol: 201 — AB (ref 0–200)
Triglycerides: 107 (ref 40–160)

## 2023-01-11 NOTE — Progress Notes (Signed)
[place] Counselor/Therapist Progress Note  Patient ID: ALYRIC LONES, MRN: [id number],    Date: 01/11/2023  Time Spent: 45 mins  Treatment Type: Individual Therapy  Reported Symptoms: Pt presented for follow up session, via webex video.  Pt grants consent for session, stating she is in her home with no one else present.  I shared with pt that I am in my office with no one else present here either.    Mental Status Exam: Appearance:  Casual     Behavior: Appropriate  Motor: Normal  Speech/Language:  Clear and Coherent  Affect: Appropriate  Mood: normal  Thought process: normal  Thought content:   WNL  Sensory/Perceptual disturbances:   WNL  Orientation: oriented to person, place, and time/date  Attention: Good  Concentration: Good  Memory: WNL  Fund of knowledge:  Good  Insight:   Good  Judgment:  Good  Impulse Control: Good   Risk Assessment: Danger to Self:  No Self-injurious Behavior: No Danger to Others: No Duty to Warn:no Physical Aggression / Violence:No  Access to Firearms a concern: No  Gang Involvement:No   Subjective: Pt shares that "There was a private viewing for the family of [name]'s brother-in-law and the service will be next week.  The son and daughter-in-law had their new baby yesterday; this was great news for the family in the midst of the sadness.  [name]'s sister continues to do OK; he was able to be in [place] yesterday for work and stopped in yesterday to see them."  Pt and Herbie Baltimore are going to [place] next week for a [employer] concert and pt is looking forward to that.  [name]'s other brother and sister-in-law will be in [place] and they will go to the concert on the same night as pt.  They will spend time together while out there.  Pt shares that Marcello Moores is back at school and is going this weekend with his girlfriend to her house for the weekend.  Pt shares that Ulice Dash is sick this week and sat down with them this past week and talked with them for  an hour and a half about his desire to return to [place] and go to school.  They have been firm with their expectation that he prove to them that he can be successful in school before they will pay for him to return to [place].  They suggested him taking an online "mini-mester" class to show them he can do it.  He shared that he is depressed because there are none of his friends in [place] to hang out with.  Ulice Dash is still not doing his own laundry and is not cleaning up after himself at home with them.  Ulice Dash has gotten a job at [employer] but does not think they are paying him enough.  They have encouraged Ulice Dash to work hard at his current job, save his money and put himself in a good position to move to [place] and have been clear with them that they will not just financially bail him out and keep spending money on his education at this time, in this circumstance.  Congratulated pt on staying committed to the course.  Pt shares that she has been struggling with self care since our last session.  She has been trying to eat better and not stress eat; she continues to walk the dogs regularly; is going on vacation next week.   Encouraged pt to continue with her self care activities and we will meet in  3 wks for a follow up session.   Interventions: Cognitive Behavioral Therapy  Diagnosis:Adjustment disorder with depressed mood  Plan: Treatment Plan Strengths/Abilities:  Intelligent, Intuitive, Willing to participate in therapy Treatment Preferences:  Outpatient Individual Therapy Statement of Needs:  Patient is to use CBT, mindfulness and coping skills to help manage and/or decrease symptoms associated with their diagnosis. Symptoms:  Depressed/Irritable mood, worry, social withdrawal Problems Addressed:  Depressive thoughts, Sadness, Sleep issues, etc. Long Term Goals:  Pt to reduce overall level, frequency, and intensity of the feelings of depression as evidenced by decreased irritability, negative self talk,  and helpless feelings from 6 to 7 days/week to 0 to 1 days/week, per client report, for at least 3 consecutive months.  Progress: 30% Short Term Goals:  Pt to verbally express understanding of the relationship between feelings of depression and their impact on thinking patterns and behaviors.  Pt to verbalize an understanding of the role that distorted thinking plays in creating fears, excessive worry, and ruminations.  Progress: 30% Target Date:  11/24/2023 Frequency:  Monthly Modality:  Cognitive Behavioral Therapy Interventions by Therapist:  Therapist will use CBT, Mindfulness exercises, Coping skills and Referrals, as needed by client. Client has verbally approved this treatment plan.  Ivan Anchors, [facility]

## 2023-01-14 ENCOUNTER — Encounter (INDEPENDENT_AMBULATORY_CARE_PROVIDER_SITE_OTHER): Payer: Self-pay | Admitting: Family Medicine

## 2023-01-14 ENCOUNTER — Other Ambulatory Visit (INDEPENDENT_AMBULATORY_CARE_PROVIDER_SITE_OTHER): Payer: Self-pay | Admitting: Family Medicine

## 2023-01-14 ENCOUNTER — Ambulatory Visit (INDEPENDENT_AMBULATORY_CARE_PROVIDER_SITE_OTHER): Payer: 59 | Admitting: Family Medicine

## 2023-01-14 VITALS — BP 139/93 | HR 61 | Temp 97.6°F | Ht 65.0 in | Wt 193.8 lb

## 2023-01-14 DIAGNOSIS — F1510 Other stimulant abuse, uncomplicated: Secondary | ICD-10-CM | POA: Diagnosis not present

## 2023-01-14 DIAGNOSIS — E559 Vitamin D deficiency, unspecified: Secondary | ICD-10-CM

## 2023-01-14 DIAGNOSIS — F39 Unspecified mood [affective] disorder: Secondary | ICD-10-CM

## 2023-01-14 DIAGNOSIS — R7303 Prediabetes: Secondary | ICD-10-CM | POA: Diagnosis not present

## 2023-01-14 DIAGNOSIS — Z6832 Body mass index (BMI) 32.0-32.9, adult: Secondary | ICD-10-CM

## 2023-01-14 MED ORDER — BUPROPION HCL ER (SR) 150 MG PO TB12: 150.0000 mg | ORAL_TABLET | Freq: Every day | ORAL | 0 refills | Status: DC

## 2023-01-15 DIAGNOSIS — F1510 Other stimulant abuse, uncomplicated: Secondary | ICD-10-CM | POA: Insufficient documentation

## 2023-01-22 ENCOUNTER — Telehealth (INDEPENDENT_AMBULATORY_CARE_PROVIDER_SITE_OTHER): Payer: 59 | Admitting: Psychology

## 2023-01-22 DIAGNOSIS — F4320 Adjustment disorder, unspecified: Secondary | ICD-10-CM | POA: Diagnosis not present

## 2023-01-22 DIAGNOSIS — F5089 Other specified eating disorder: Secondary | ICD-10-CM | POA: Diagnosis not present

## 2023-01-22 DIAGNOSIS — F411 Generalized anxiety disorder: Secondary | ICD-10-CM

## 2023-01-22 NOTE — Progress Notes (Signed)
  Office: [phone]  /  Fax: [phone]    Date: January 22, 2023    Appointment Start Time: 8:32am Duration: 21 minutes Provider: Glennie Isle, Psy.D. Type of Session: Individual Therapy  Location of Patient: Home (private location) Location of Provider: Provider's Home (private office) Type of Contact: Telepsychological Visit via MyChart Video Visit  Session Content: Leea is a 52 y.o. female presenting for a follow-up appointment to address the previously established treatment goal of increasing coping skills.Today's appointment was a telepsychological visit. Lashane provided verbal consent for today's telepsychological appointment and she is aware she is responsible for securing confidentiality on her end of the session. Prior to proceeding with today's appointment, Daianna's physical location at the time of this appointment was obtained as well a phone number she could be reached at in the event of technical difficulties. Draya and this provider participated in today's telepsychological service.   This provider conducted a brief check-in. Rocket shared about recent events, including a trip to [facility]. She acknowledged deviations while on vacation, but reported doing "better" at home with food and water intake. Dorthey also discussed a reduction in emotional eating behaviors. Session focused further on mindfulness to assist with coping. She indicated it "helps," but acknowledged challenges engaging in exercises regularly. Psychoeducation regarding formal (e.g., setting aside a specific time daily to engage in an exercise) and informal (e.g., cultivating awareness in the present moment and taking a non-judgmental approach while engaging in day-to-day tasks) mindfulness was provided. This provider also discussed the utilization of YouTube for mindfulness exercises (e.g., exercises by Merri Ray). Overall, Breezie was receptive to today's appointment as evidenced by openness  to sharing, responsiveness to feedback, and willingness to continue engaging in mindfulness exercises.  Mental Status Examination:  Appearance: neat Behavior: appropriate to circumstances Mood: neutral Affect: mood congruent Speech: WNL Eye Contact: appropriate Psychomotor Activity: WNL Gait: unable to assess Thought Process: linear, logical, and goal directed and no evidence or endorsement of suicidal, homicidal, and self-harm ideation, plan and intent  Thought Content/Perception: no hallucinations, delusions, bizarre thinking or behavior endorsed or observed Orientation: AAOx4 Memory/Concentration: memory, attention, language, and fund of knowledge intact  Insight: fair Judgment: fair  Interventions:  Conducted a brief chart review Provided empathic reflections and validation Reviewed content from the previous session Employed supportive psychotherapy interventions to facilitate reduced distress and to improve coping skills with identified stressors Psychoeducation provided regarding mindfulness  DSM-5 Diagnosis(es):  F50.89 Other Specified Feeding or Eating Disorder, Emotional Eating Behaviors and F41.1 Generalized Anxiety Disorder, and F43.20 Adjustment Disorder, Unspecified  Treatment Goal & Progress: During the initial appointment with this provider, the following treatment goal was established: increase coping skills. Mlissa has demonstrated progress in her goal as evidenced by increased awareness of hunger patterns, increased awareness of triggers for emotional eating behaviors, and reduction in emotional eating behaviors . Jazlen also continues to demonstrate willingness to engage in learned skill(s).  Plan: The next appointment is scheduled for 02/04/2023 at 8am, which will be via [place] Visit. The next session will focus on working towards the established treatment goal. Judyann will continue with her primary therapist.

## 2023-01-24 NOTE — Progress Notes (Signed)
Chief Complaint:   OBESITY Kendra Day is here to discuss her progress with her obesity treatment plan along with follow-up of her obesity related diagnoses. Kendra Day is on the Category 2 Plan and states she is following her eating plan approximately 0% of the time. Kendra Day states she is walking for 30 minutes 3 times per week.  Today's visit was #: 62 Starting weight: 70 LBS Starting date: 10/12/2020 Today's weight: 193 LBS Today's date: 01/14/2023 Total lbs lost to date: 83 LBS Total lbs lost since last in-office visit: +2 LBS  Interim History: Patient's mood is better than it has been in a long time.  Patient thinks medications are working well.  Patient denies hunger or cravings but has been really been following meal plan lately.  Subjective:   1. Pre-diabetes Discussed labs with patient today. Fasting insulin 9.2, A1c 5.8.  Patient states hunger and cravings are well-controlled.  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 Patient denies headache or withdrawal symptoms.  Increase Serum creatinine and BUN very high likely due to dehydration.  3. Vitamin D deficiency Discussed labs with patient today. Patient is off all supplements.  Vitamin D at goal, 49.5.  4. Mood disorder ([place]), with emotional eating Discussed labs with patient today. Patient is sleeping well and mood is well controlled.  Patient denies need for change in dose at this time.  Emotional eating is well-controlled.  Assessment/Plan:  No orders of the defined types were placed in this encounter.   Medications Discontinued During This Encounter  Medication Reason   buPROPion (WELLBUTRIN SR) 150 MG 12 hr tablet Reorder     Meds ordered this encounter  Medications   buPROPion (WELLBUTRIN SR) 150 MG 12 hr tablet    Sig: Take 1 tablet (150 mg total) by mouth daily. With brkfst    Dispense:  30 tablet    Refill:  0     1. Pre-diabetes A1c 5.8 and fasting insulin slightly worse.   Continue PNP and weight loss.  2. Caffeine overuse (H[facility]) Limited to beverages with caffeine daily and wean off as tolerated.  Increase water to goal, will follow.  Avoid nephrotoxic substances.  3. Vitamin D deficiency Continue PNP, weight loss.  No need for supplementation at this time.  4. Mood disorder ([place]), with emotional eating Continue exercise and increase activity as tolerated.  Continue PNP.  Refill- buPROPion (WELLBUTRIN SR) 150 MG 12 hr tablet; Take 1 tablet (150 mg total) by mouth daily. With brkfst  Dispense: 30 tablet; Refill: 0  5. BMI 32.0-32.9,adult-current bmi 32.2  6. Morbid obesity (HCC)-start bmi 39.77 We reviewed labs, BMP, fasting insulin, A1c, vitamin D from around January and all questions answered.  Kendra Day is currently in the action stage of change. As such, her goal is to continue with weight loss efforts. She has agreed to the Category 2 Plan and keeping a food journal and adhering to recommended goals of 1300 calories and 110+ protein daily.   Exercise goals:  As is but increase activity as tolerated.  Behavioral modification strategies: increasing lean protein intake, decreasing simple carbohydrates, increasing water intake, emotional eating strategies, and avoiding temptations.  Kendra Day has agreed to follow-up with our clinic in 3-4 weeks. She was informed of the importance of frequent follow-up visits to maximize her success with intensive lifestyle modifications for her multiple health conditions.   Objective:   Blood pressure (!) 139/93, pulse 61, temperature 97.6 F (36.4 C), height '5\' 5"'$  (1.651 m), weight  193 lb 12.8 oz (87.9 kg), SpO2 100 %. Body mass index is 32.25 kg/m.  General: Cooperative, alert, well developed, in no acute distress. HEENT: Conjunctivae and lids unremarkable. Cardiovascular: Regular rhythm.  Lungs: Normal work of breathing. Neurologic: No focal deficits.   Lab Results  Component Value Date   CREATININE 1.08 (H)  12/13/2022   BUN 30 (H) 12/13/2022   NA 140 12/13/2022   K 5.2 12/13/2022   CL 102 12/13/2022   CO2 24 12/13/2022   Lab Results  Component Value Date   ALT 16 12/18/2021   AST 17 12/18/2021   ALKPHOS 115 12/18/2021   BILITOT 0.3 12/18/2021   Lab Results  Component Value Date   HGBA1C 5.8 (H) 12/13/2022   HGBA1C 5.8 (H) 06/20/2022   HGBA1C 5.7 (H) 12/18/2021   HGBA1C 5.7 (H) 08/22/2021   HGBA1C 5.6 04/19/2021   Lab Results  Component Value Date   INSULIN 9.2 12/13/2022   INSULIN 8.3 12/18/2021   INSULIN 6.4 08/22/2021   INSULIN 5.8 04/19/2021   INSULIN 10.8 10/12/2020   Lab Results  Component Value Date   TSH 0.681 02/08/2014   Lab Results  Component Value Date   CHOL 179 12/18/2021   HDL 68 12/18/2021   LDLCALC 95 12/18/2021   TRIG 86 12/18/2021   CHOLHDL 2.6 12/18/2021   Lab Results  Component Value Date   VD25OH 49.5 12/13/2022   VD25OH 70.6 06/20/2022   VD25OH 85.3 12/18/2021   Lab Results  Component Value Date   WBC 7.6 02/21/2014   HGB 14.6 02/21/2014   HCT 41.6 02/21/2014   MCV 85.2 02/21/2014   PLT 218 02/21/2014   No results found for: "IRON", "TIBC", "FERRITIN"  Attestation Statements:   Reviewed by clinician on day of visit: allergies, medications, problem list, medical history, surgical history, family history, social history, and previous encounter notes.  I, Davy Pique, RMA, am acting as Location manager for [employer], DO.   I have reviewed the above documentation for accuracy and completeness, and I agree with the above. Marjory Sneddon, D.O.  The [place] was signed into law in 2016 which includes the topic of electronic health records.  This provides immediate access to information in MyChart.  This includes consultation notes, operative notes, office notes, lab results and pathology reports.  If you have any questions about what you read please let us know at your next visit so we can discuss your concerns and  take corrective action if need be.  We are right here with you.

## 2023-02-01 ENCOUNTER — Ambulatory Visit (INDEPENDENT_AMBULATORY_CARE_PROVIDER_SITE_OTHER): Payer: 59 | Admitting: Psychology

## 2023-02-01 DIAGNOSIS — F4321 Adjustment disorder with depressed mood: Secondary | ICD-10-CM | POA: Diagnosis not present

## 2023-02-01 NOTE — Progress Notes (Addendum)
[facility] Counselor/Therapist Progress Note  Patient ID: Kendra Day, MRN: [id number],    Date: 02/01/2023  Time Spent: 60 mins  Treatment Type: Individual Therapy  Reported Symptoms: Pt presented for follow up session, via webex video.  Pt grants consent for session, stating she is in her office with no one else present.  I shared with pt that I am in my office with no one else present here either.    Mental Status Exam: Appearance:  Casual     Behavior: Appropriate  Motor: Normal  Speech/Language:  Clear and Coherent  Affect: Appropriate  Mood: normal  Thought process: normal  Thought content:   WNL  Sensory/Perceptual disturbances:   WNL  Orientation: oriented to person, place, and time/date  Attention: Good  Concentration: Good  Memory: WNL  Fund of knowledge:  Good  Insight:   Good  Judgment:  Good  Impulse Control: Good   Risk Assessment: Danger to Self:  No Self-injurious Behavior: No Danger to Others: No Duty to Warn:no Physical Aggression / Violence:No  Access to Firearms a concern: No  Gang Involvement:No   Subjective: Pt shares that "There was a funeral last week for Kendra Day's brother-in-law (Kendra Day) and that was good for closure for some and was hard for others.  It has had an interesting impact on Kendra Day.  He admitted that he was depressed and is now trying to "right the ship by becoming more masculine (i.e., not cleaning up after himself, telling us what to do, etc.).  "Kendra Day told me that the reason he is screwed up is because I was not feminine enough when he was growing up."  He is not putting his dishes in the dishwasher or otherwise cleaning up after himself.  They told him that he has to be respectful to them and he has to clean up after himself.  Kendra Day still wants to go to live in [place]; Kendra Day is not willing to kick him out of the house.  Kendra Day has a friend who has converted to Islam and has a very conservative view of men vs women.  Pt feels  quite disrespected by Kendra Day at this time.  Kendra Day has gotten a job at [employer] and is working, but not enough hours per week.  Pt is hopeful that Kendra Day is in a phase that he Kendra Day get out at some point.  Pt shares she "had a great time in [place] and the concert was great."  Pt shares she is ready for Spring; she continues to walk the dogs regularly.  Encouraged pt to continue with her self care activities and we Kendra Day meet in 3 wks for a follow up session.   Interventions: Cognitive Behavioral Therapy  Diagnosis:Adjustment disorder with depressed mood  Plan: Treatment Plan Strengths/Abilities:  Intelligent, Intuitive, Willing to participate in therapy Treatment Preferences:  Outpatient Individual Therapy Statement of Needs:  Patient is to use CBT, mindfulness and coping skills to help manage and/or decrease symptoms associated with their diagnosis. Symptoms:  Depressed/Irritable mood, worry, social withdrawal Problems Addressed:  Depressive thoughts, Sadness, Sleep issues, etc. Long Term Goals:  Pt to reduce overall level, frequency, and intensity of the feelings of depression as evidenced by decreased irritability, negative self talk, and helpless feelings from 6 to 7 days/week to 0 to 1 days/week, per client report, for at least 3 consecutive months.  Progress: 30% Short Term Goals:  Pt to verbally express understanding of the relationship between feelings of depression and their impact on thinking  patterns and behaviors.  Pt to verbalize an understanding of the role that distorted thinking plays in creating fears, excessive worry, and ruminations.  Progress: 30% Target Date:  11/24/2023 Frequency:  Monthly Modality:  Cognitive Behavioral Therapy Interventions by Therapist:  Therapist Kendra Day use CBT, Mindfulness exercises, Coping skills and Referrals, as needed by client. Client has verbally approved this treatment plan.  Ivan Anchors, [facility]

## 2023-02-04 ENCOUNTER — Telehealth (INDEPENDENT_AMBULATORY_CARE_PROVIDER_SITE_OTHER): Payer: 59 | Admitting: Psychology

## 2023-02-04 DIAGNOSIS — F4320 Adjustment disorder, unspecified: Secondary | ICD-10-CM

## 2023-02-04 DIAGNOSIS — F411 Generalized anxiety disorder: Secondary | ICD-10-CM | POA: Diagnosis not present

## 2023-02-04 DIAGNOSIS — F5089 Other specified eating disorder: Secondary | ICD-10-CM

## 2023-02-04 NOTE — Progress Notes (Signed)
  Office: [phone]  /  Fax: [phone]    Date: February 04, 2023    Appointment Start Time: 8:00am Duration: 24 minutes Provider: Glennie Isle, Psy.D. Type of Session: Individual Therapy  Location of Patient: Home (private location) Location of Provider: Provider's Home (private office) Type of Contact: Telepsychological Visit via MyChart Video Visit  Session Content: Kendra Day is a 52 y.o. female presenting for a follow-up appointment to address the previously established treatment goal of increasing coping skills.Today's appointment was a telepsychological visit. Kendra Day provided verbal consent for today's telepsychological appointment and she is aware she is responsible for securing confidentiality on her end of the session. Prior to proceeding with today's appointment, Kendra Day's physical location at the time of this appointment was obtained as well a phone number she could be reached at in the event of technical difficulties. Kendra Day and this provider participated in today's telepsychological service.   This provider conducted a brief check-in. Kendra Day shared things have been "pretty good." She noted an improvement in her eating habits. Further explored and processed. Psychoeducation provided regarding the importance of eating regularly, especially as it relates to making better choices and engaging in portion control. Engaged Kendra Day in problem solving to ensure she is eating regularly. Notably, she continues to report an overall reduction in emotional eating behaviors. Furthermore, termination planning was discussed. Kendra Day was receptive to a follow-up appointment in 3-4 weeks and an additional follow-up/termination appointment in 3-4 weeks after that. Overall, Kendra Day was receptive to today's appointment as evidenced by openness to sharing, responsiveness to feedback, and willingness to implement discussed strategies .  Mental Status Examination:  Appearance: neat Behavior:  appropriate to circumstances Mood: neutral Affect: mood congruent Speech: WNL Eye Contact: appropriate Psychomotor Activity: WNL Gait: unable to assess Thought Process: linear, logical, and goal directed and no evidence or endorsement of suicidal, homicidal, and self-harm ideation, plan and intent  Thought Content/Perception: no hallucinations, delusions, bizarre thinking or behavior endorsed or observed Orientation: AAOx4 Memory/Concentration: memory, attention, language, and fund of knowledge intact  Insight: good Judgment: good  Interventions:  Conducted a brief chart review Provided empathic reflections and validation Provided positive reinforcement Employed supportive psychotherapy interventions to facilitate reduced distress and to improve coping skills with identified stressors Engaged patient in problem solving Discussed termination planning  DSM-5 Diagnosis(es):  F50.89 Other Specified Feeding or Eating Disorder, Emotional Eating Behaviors and F41.1 Generalized Anxiety Disorder, and F43.20 Adjustment Disorder, Unspecified  Treatment Goal & Progress: During the initial appointment with this provider, the following treatment goal was established: increase coping skills. Kendra Day has demonstrated progress in her goal as evidenced by increased awareness of hunger patterns, increased awareness of triggers for emotional eating behaviors, and reduction in emotional eating behaviors . Kendra Day also continues to demonstrate willingness to engage in learned skill(s).  Plan: The next appointment is scheduled for 02/25/2023 at 8:30am, which will be via MyChart Video Visit. The next session will focus on working towards the established treatment goal. Kendra Day will continue with her primary therapist.

## 2023-02-09 ENCOUNTER — Other Ambulatory Visit (INDEPENDENT_AMBULATORY_CARE_PROVIDER_SITE_OTHER): Payer: Self-pay | Admitting: Family Medicine

## 2023-02-09 DIAGNOSIS — F39 Unspecified mood [affective] disorder: Secondary | ICD-10-CM

## 2023-02-11 ENCOUNTER — Ambulatory Visit (INDEPENDENT_AMBULATORY_CARE_PROVIDER_SITE_OTHER): Payer: 59 | Admitting: Family Medicine

## 2023-02-22 ENCOUNTER — Ambulatory Visit: Payer: 59 | Admitting: Psychology

## 2023-02-25 ENCOUNTER — Telehealth (INDEPENDENT_AMBULATORY_CARE_PROVIDER_SITE_OTHER): Payer: 59 | Admitting: Psychology

## 2023-02-25 ENCOUNTER — Ambulatory Visit (INDEPENDENT_AMBULATORY_CARE_PROVIDER_SITE_OTHER): Payer: 59 | Admitting: Family Medicine

## 2023-02-25 ENCOUNTER — Encounter (INDEPENDENT_AMBULATORY_CARE_PROVIDER_SITE_OTHER): Payer: Self-pay | Admitting: Family Medicine

## 2023-02-25 VITALS — BP 142/83 | HR 66 | Temp 98.1°F | Ht 65.0 in | Wt 193.6 lb

## 2023-02-25 DIAGNOSIS — J302 Other seasonal allergic rhinitis: Secondary | ICD-10-CM | POA: Insufficient documentation

## 2023-02-25 DIAGNOSIS — F5089 Other specified eating disorder: Secondary | ICD-10-CM | POA: Diagnosis not present

## 2023-02-25 DIAGNOSIS — R7303 Prediabetes: Secondary | ICD-10-CM

## 2023-02-25 DIAGNOSIS — F4320 Adjustment disorder, unspecified: Secondary | ICD-10-CM

## 2023-02-25 DIAGNOSIS — F39 Unspecified mood [affective] disorder: Secondary | ICD-10-CM

## 2023-02-25 DIAGNOSIS — Z6832 Body mass index (BMI) 32.0-32.9, adult: Secondary | ICD-10-CM

## 2023-02-25 DIAGNOSIS — F411 Generalized anxiety disorder: Secondary | ICD-10-CM

## 2023-02-25 MED ORDER — BUPROPION HCL ER (SR) 150 MG PO TB12: 150.0000 mg | ORAL_TABLET | Freq: Every day | ORAL | 0 refills | Status: DC

## 2023-02-25 NOTE — Progress Notes (Signed)
  Office: [phone]  /  Fax: [phone]    Date: February 25, 2023    Appointment Start Time: 8:00am Duration: 29 minutes Provider: Glennie Isle, Psy.D. Type of Session: Individual Therapy  Location of Patient: Work (private location) Location of Provider: Provider's Home (private office) Type of Contact: Telepsychological Visit via MyChart Video Visit  Session Content: Kendra Day is a 52 y.o. female presenting for a follow-up appointment to address the previously established treatment goal of increasing coping skills.Today's appointment was a telepsychological visit. Kendra Day provided verbal consent for today's telepsychological appointment and she is aware she is responsible for securing confidentiality on her end of the session. Prior to proceeding with today's appointment, Kendra Day's physical location at the time of this appointment was obtained as well a phone number she could be reached at in the event of technical difficulties. Kendra Day and this provider participated in today's telepsychological service.   This provider conducted a brief check-in. Kendra Day reported experiencing challenges with being "consistent" with her eating habits, especially at work. Further explored and processed. Reviewed all or nothing thinking. She was engaged in problem solving to help her eat congruent to her structured meal plan (e.g., roasting/steaming vegetables for multiple meals; doubling recipes; purchasing frozen meals congruent to her meal plan; leaving foods congruent to meal plan at work). Overall, Kendra Day was receptive to today's appointment as evidenced by openness to sharing, responsiveness to feedback, and willingness to implement discussed strategies .  Mental Status Examination:  Appearance: neat Behavior: appropriate to circumstances Mood: sad Affect: mood congruent; tearful at times Speech: WNL Eye Contact: appropriate Psychomotor Activity: WNL Gait: unable to assess Thought Process:  linear, logical, and goal directed and no evidence or endorsement of suicidal, homicidal, and self-harm ideation, plan and intent  Thought Content/Perception: no hallucinations, delusions, bizarre thinking or behavior endorsed or observed Orientation: AAOx4 Memory/Concentration: memory, attention, language, and fund of knowledge intact  Insight: good Judgment: good  Interventions:  Conducted a brief chart review Provided empathic reflections and validation Reviewed content from the previous session Employed supportive psychotherapy interventions to facilitate reduced distress and to improve coping skills with identified stressors Engaged patient in problem solving  DSM-5 Diagnosis(es):  F50.89 Other Specified Feeding or Eating Disorder, Emotional Eating Behaviors and F41.1 Generalized Anxiety Disorder, and F43.20 Adjustment Disorder, Unspecified  Treatment Goal & Progress: During the initial appointment with this provider, the following treatment goal was established: increase coping skills. Kendra Day has demonstrated progress in her goal as evidenced by increased awareness of hunger patterns, increased awareness of triggers for emotional eating behaviors, and reduction in emotional eating behaviors . Kendra Day also continues to demonstrate willingness to engage in learned skill(s).  Plan: Based on current events, a sooner appointment was recommended. As such, next appointment is scheduled for 03/11/2023 at 8am, which will be via [place] Visit. The next session will focus on working towards the established treatment goal. Kendra Day will continue with her primary therapist.

## 2023-02-25 NOTE — Progress Notes (Signed)
Marjory Sneddon, D.O.  ABFM, ABOM Specializing in Clinical Bariatric Medicine  Office located at: [address], [place]  [address]     Assessment and Plan:   No orders of the defined types were placed in this encounter.   There are no discontinued medications.   No orders of the defined types were placed in this encounter.    Pre-diabetes Assessment: Condition is Not optimized. Lab Results  Component Value Date   HGBA1C 5.8 (H) 12/13/2022   HGBA1C 5.8 (H) 06/20/2022   HGBA1C 5.7 (H) 12/18/2021   INSULIN 9.2 12/13/2022   INSULIN 8.3 12/18/2021   INSULIN 6.4 08/22/2021  She is not on any medication for her Pre-diabetes. This is diet controlled. Her cravings and hunger are well controlled.   Plan: Kabella will continue to work on weight loss, exercise, via their meal plan we devised to help decrease the risk of progressing to diabetes.    Mood disorder ([place]), with emotional eating Assessment: Condition is stable. She states that her mood has been fluctuating due to stress from work and family issues. Denies any SI/HI. She journals occasionally. Recently she met with Dr.Barker and they discussed meal prepping when she gets busy. She has been compliant with Wellbutrin 150 mg daily.   Plan: Will refill Wellbutrin today. Continue with Wellbutrin 150 mg daily. Continue with seeing Dr.Barker for her mood disorder and her counselor for life stressors. I highly recommended her to use the Calm App/Head space for meditation 2x a day (once in the morning and once at night) to alleviate stress and anxiety. I also recommend her to see her own therapist more frequently due to increase with family stress.    Seasonal Allergies Assessment: Condition is stable. She reports good compliance and tolerance with Singulair 10 mg daily and Allergra 180 mg daily. No concerns.  Plan: Continue with Singulair 10 mg daily and Allergra 180 mg daily as recommended by  PCP/specialist   TREATMENT PLAN FOR OBESITY: BMI 32.0-32.9,adult-current bmi 32.2 Morbid obesity (HCC)-start bmi 39.77 Assessment: Condition is Improving, but not optimized.. Biometric data collected today, was reviewed with patient.  Fat mass has decreased by 1.6lb. Muscle mass has increased by 1.4lb. Total body water has decreased by 1.4lb.   Plan: Continue with  the Category 2 Plan and keeping a food journal and adhering to recommended goals of 1300 calories and 110 protein  Behavioral Intervention Additional resources provided today: category 2 meal plan information Evidence-based interventions for health behavior change were utilized today including the discussion of self monitoring techniques, problem-solving barriers and SMART goal setting techniques.   Regarding patient's less desirable eating habits and patterns, we employed the technique of small changes.  Pt will specifically work on: not gaining weight and doing healthy things for herself like meditation for next visit.    Recommended Physical Activity Goals Kynzee has been advised to work up to 150 minutes of moderate intensity aerobic activity a week and strengthening exercises 2-3 times per week for cardiovascular health, weight loss maintenance and preservation of muscle mass.  She has agreed to Continue current level of physical activity   FOLLOW UP: No follow-ups on file. She was informed of the importance of frequent follow up visits to maximize her success with intensive lifestyle modifications for her multiple health conditions.  Subjective:   Chief complaint: Obesity Ahliya is here to discuss her progress with her obesity treatment plan. She is on the the Category 2 Plan and keeping a  food journal and adhering to recommended goals of 1300 calories and 110 protein and states she is following her eating plan approximately 0% of the time. She states she is exercising 30 minutes 4 days per week.  Interval History:   JESSE MATTSON is here for a follow up office visit. We reviewed her meal plan and all questions were answered. Patient's food recall appears to be accurate and consistent with what is on plan when she is following it. When eating on plan, her hunger and cravings are well controlled.     Since last office visit she endorses that it has been hard for her to follow the meal plan lately with her stressful and busy work schedule. After this week, she wants to commit completely to her meal plan.   Review of Systems:  Pertinent positives were addressed with patient today.  Weight Summary and Biometrics   No data recorded No data recorded   No data recorded No data recorded No data recorded No data recorded  Objective:   PHYSICAL EXAM:  There were no vitals taken for this visit. There is no height or weight on file to calculate BMI.  General: Well Developed, well nourished, and in no acute distress.  HEENT: Normocephalic, atraumatic Skin: Warm and dry, cap RF less 2 sec, good turgor Chest:  Normal excursion, shape, no gross abn Respiratory: speaking in full sentences, no conversational dyspnea NeuroM-Sk: Ambulates w/o assistance, moves * 4 Psych: A and O *3, insight good, mood-full  DIAGNOSTIC DATA REVIEWED:  BMET    Component Value Date/Time   NA 140 12/13/2022 1124   K 5.2 12/13/2022 1124   CL 102 12/13/2022 1124   CO2 24 12/13/2022 1124   GLUCOSE 95 12/13/2022 1124   GLUCOSE 104 (H) 02/21/2014 1124   BUN 30 (H) 12/13/2022 1124   CREATININE 1.08 (H) 12/13/2022 1124   CALCIUM 9.9 12/13/2022 1124   GFRNONAA 66 (L) 02/21/2014 1124   GFRAA 76 (L) 02/21/2014 1124   Lab Results  Component Value Date   HGBA1C 5.8 (H) 12/13/2022   HGBA1C 5.6 04/19/2021   Lab Results  Component Value Date   INSULIN 9.2 12/13/2022   INSULIN 10.8 10/12/2020   Lab Results  Component Value Date   TSH 0.681 02/08/2014   CBC    Component Value Date/Time   WBC 7.6 02/21/2014 1124    RBC 4.88 02/21/2014 1124   HGB 14.6 02/21/2014 1124   HCT 41.6 02/21/2014 1124   PLT 218 02/21/2014 1124   MCV 85.2 02/21/2014 1124   MCH 29.9 02/21/2014 1124   MCHC 35.1 02/21/2014 1124   RDW 12.6 02/21/2014 1124   Iron Studies No results found for: "IRON", "TIBC", "FERRITIN", "IRONPCTSAT" Lipid Panel     Component Value Date/Time   CHOL 179 12/18/2021 0901   TRIG 86 12/18/2021 0901   HDL 68 12/18/2021 0901   CHOLHDL 2.6 12/18/2021 0901   LDLCALC 95 12/18/2021 0901   Hepatic Function Panel     Component Value Date/Time   PROT 7.0 12/18/2021 0901   ALBUMIN 4.5 12/18/2021 0901   AST 17 12/18/2021 0901   ALT 16 12/18/2021 0901   ALKPHOS 115 12/18/2021 0901   BILITOT 0.3 12/18/2021 0901      Component Value Date/Time   TSH 0.681 02/08/2014 1109   Nutritional Lab Results  Component Value Date   VD25OH 49.5 12/13/2022   VD25OH 70.6 06/20/2022   VD25OH 85.3 12/18/2021    Attestations:   Reviewed by  clinician on day of visit: allergies, medications, problem list, medical history, surgical history, family history, social history, and previous encounter notes.    I,Special Puri,acting as a Education administrator for [employer], DO.,have documented all relevant documentation on the behalf of Mellody Dance, DO,as directed by  Mellody Dance, DO while in the presence of Mellody Dance, DO.   I, Mellody Dance, DO, have reviewed all documentation for this visit. The documentation on 02/25/23 for the exam, diagnosis, procedures, and orders are all accurate and complete.

## 2023-02-27 ENCOUNTER — Ambulatory Visit (INDEPENDENT_AMBULATORY_CARE_PROVIDER_SITE_OTHER): Payer: 59 | Admitting: Psychology

## 2023-02-27 DIAGNOSIS — F4321 Adjustment disorder with depressed mood: Secondary | ICD-10-CM | POA: Diagnosis not present

## 2023-02-27 NOTE — Progress Notes (Signed)
[facility] Counselor/Therapist Progress Note  Patient ID: Kendra Day, MRN: [id number],    Date: 02/27/2023  Time Spent: 60 mins  Treatment Type: Individual Therapy  Reported Symptoms: Pt presented for follow up session, via webex video.  Pt grants consent for session, stating she is in her office with no one else present.  I shared with pt that I am in my office with no one else present here either.    Mental Status Exam: Appearance:  Casual     Behavior: Appropriate  Motor: Normal  Speech/Language:  Clear and Coherent  Affect: Appropriate  Mood: normal  Thought process: normal  Thought content:   WNL  Sensory/Perceptual disturbances:   WNL  Orientation: oriented to person, place, and time/date  Attention: Good  Concentration: Good  Memory: WNL  Fund of knowledge:  Good  Insight:   Good  Judgment:  Good  Impulse Control: Good   Risk Assessment: Danger to Self:  No Self-injurious Behavior: No Danger to Others: No Duty to Warn:no Physical Aggression / Violence:No  Access to Firearms a concern: No  Gang Involvement:No   Subjective: Pt shares that "It has been a crazy month or so.  We have had some issues with Ulice Dash; he continues to want to dominate conversations and to be right about everything he says.  We had an incident a couple of weeks ago where he was rude and disrespectful to me and Herbie Baltimore."  Pt shares that Herbie Baltimore had agreed to [employer] a steak for [employer] well after dinner time and Ulice Dash was berating him for not doing it correctly.  Pt was also telling Herbie Baltimore he was "wrong and stupid for his political beliefs during this same interaction."  Pt is concerned about how Ulice Dash is interacting with other people outside of their home.  Ulice Dash blames other people for lots of things in his life; pt believes that Ulice Dash is projecting his own shortcomings.  Ulice Dash has gotten a speeding ticket and they are making him deal with the legal implications of the ticket.  He also still has  the legal issue related to him going to the [place] football game in the Fall; that court date is next week; Herbie Baltimore is going with him and Ulice Dash has asked pt not to attend.  Ulice Dash is still working at [employer] but he is not working very many hours per week.  Pt shares that [name]'s sister and her children are doing pretty well in spite of the loss of her husband.  Pt shares that she continues to walk the dog regularly and is listening to audio books as well.  Encouraged pt to continue with her self care activities and we will meet in 3 wks for a follow up session.   Interventions: Cognitive Behavioral Therapy  Diagnosis:Adjustment disorder with depressed mood  Plan: Treatment Plan Strengths/Abilities:  Intelligent, Intuitive, Willing to participate in therapy Treatment Preferences:  Outpatient Individual Therapy Statement of Needs:  Patient is to use CBT, mindfulness and coping skills to help manage and/or decrease symptoms associated with their diagnosis. Symptoms:  Depressed/Irritable mood, worry, social withdrawal Problems Addressed:  Depressive thoughts, Sadness, Sleep issues, etc. Long Term Goals:  Pt to reduce overall level, frequency, and intensity of the feelings of depression as evidenced by decreased irritability, negative self talk, and helpless feelings from 6 to 7 days/week to 0 to 1 days/week, per client report, for at least 3 consecutive months.  Progress: 30% Short Term Goals:  Pt to verbally express understanding  of the relationship between feelings of depression and their impact on thinking patterns and behaviors.  Pt to verbalize an understanding of the role that distorted thinking plays in creating fears, excessive worry, and ruminations.  Progress: 30% Target Date:  11/24/2023 Frequency:  Monthly Modality:  Cognitive Behavioral Therapy Interventions by Therapist:  Therapist will use CBT, Mindfulness exercises, Coping skills and Referrals, as needed by client. Client has verbally  approved this treatment plan.  Ivan Anchors, [facility]

## 2023-03-11 ENCOUNTER — Telehealth (INDEPENDENT_AMBULATORY_CARE_PROVIDER_SITE_OTHER): Payer: 59 | Admitting: Psychology

## 2023-03-11 DIAGNOSIS — F5089 Other specified eating disorder: Secondary | ICD-10-CM | POA: Diagnosis not present

## 2023-03-11 DIAGNOSIS — F411 Generalized anxiety disorder: Secondary | ICD-10-CM

## 2023-03-11 DIAGNOSIS — F4320 Adjustment disorder, unspecified: Secondary | ICD-10-CM | POA: Diagnosis not present

## 2023-03-11 NOTE — Progress Notes (Signed)
  Office: [phone]  /  Fax: [phone]    Date: March 11, 2023    Appointment Start Time: 8:00am Duration: 27 minutes Provider: Lawerance Cruel, Psy.D. Type of Session: Individual Therapy  Location of Patient: Work (private location) Location of Provider: Provider's Home (private office) Type of Contact: Telepsychological Visit via MyChart Video Visit  Session Content: Jolissa is a 52 y.o. female presenting for a follow-up appointment to address the previously established treatment goal of increasing coping skills.Today's appointment was a telepsychological visit. Teri provided verbal consent for today's telepsychological appointment and she is aware she is responsible for securing confidentiality on her end of the session. Prior to proceeding with today's appointment, Satomi's physical location at the time of this appointment was obtained as well a phone number she could be reached at in the event of technical difficulties. Azelyn and this provider participated in today's telepsychological service.   This provider conducted a brief check-in. Reyes reported, "Things have been pretty good." She discussed a reduction in work-related stressors. Further explored and processed. Izabell also reported implementing discussed strategies, noting an improvement in eating habits. Positive reinforcement was provided. Psychoeducation provided regarding self-compassion. Andrew was engaged in a self-compassion exercise to help with eating-related challenges and other ongoing stressors. She was encouraged to regularly ask herself, "What do I need right now?" and "How can I comfort and care for myself in this moment?" Overall, Labresha was receptive to today's appointment as evidenced by openness to sharing, responsiveness to feedback, and willingness to work toward increasing self-compassion.  Mental Status Examination:  Appearance: neat Behavior: appropriate to circumstances Mood:  neutral Affect: mood congruent Speech: WNL Eye Contact: appropriate Psychomotor Activity: WNL Gait: unable to assess Thought Process: linear, logical, and goal directed and no evidence or endorsement of suicidal, homicidal, and self-harm ideation, plan and intent  Thought Content/Perception: no hallucinations, delusions, bizarre thinking or behavior endorsed or observed Orientation: AAOx4 Memory/Concentration: memory, attention, language, and fund of knowledge intact  Insight: good Judgment: good  Interventions:  Conducted a brief chart review Provided empathic reflections and validation Reviewed content from the previous session Provided positive reinforcement Employed supportive psychotherapy interventions to facilitate reduced distress and to improve coping skills with identified stressors Psychoeducation provided regarding self-compassion Engaged pt in a self-compassion exercise  DSM-5 Diagnosis(es):  F50.89 Other Specified Feeding or Eating Disorder, Emotional Eating Behaviors and F41.1 Generalized Anxiety Disorder, and F43.20 Adjustment Disorder, Unspecified  Treatment Goal & Progress: During the initial appointment with this provider, the following treatment goal was established: increase coping skills. Dura has demonstrated progress in her goal as evidenced by increased awareness of hunger patterns, increased awareness of triggers for emotional eating behaviors, and reduction in emotional eating behaviors . Loriah also continues to demonstrate willingness to engage in learned skill(s).  Plan: The next appointment is scheduled for 04/01/2023 at 8am, which will be via MyChart Video Visit. The next session will focus further on self-compassion.

## 2023-03-14 ENCOUNTER — Ambulatory Visit (INDEPENDENT_AMBULATORY_CARE_PROVIDER_SITE_OTHER): Payer: 59 | Admitting: Family Medicine

## 2023-03-14 ENCOUNTER — Encounter (INDEPENDENT_AMBULATORY_CARE_PROVIDER_SITE_OTHER): Payer: Self-pay | Admitting: Family Medicine

## 2023-03-14 VITALS — BP 135/76 | HR 57 | Temp 98.0°F | Ht 65.0 in | Wt 192.2 lb

## 2023-03-14 DIAGNOSIS — F39 Unspecified mood [affective] disorder: Secondary | ICD-10-CM | POA: Diagnosis not present

## 2023-03-14 DIAGNOSIS — R7303 Prediabetes: Secondary | ICD-10-CM | POA: Diagnosis not present

## 2023-03-14 DIAGNOSIS — J302 Other seasonal allergic rhinitis: Secondary | ICD-10-CM | POA: Diagnosis not present

## 2023-03-14 DIAGNOSIS — Z6832 Body mass index (BMI) 32.0-32.9, adult: Secondary | ICD-10-CM

## 2023-03-14 MED ORDER — BUPROPION HCL ER (SR) 150 MG PO TB12: 150.0000 mg | ORAL_TABLET | Freq: Every day | ORAL | 0 refills | Status: DC

## 2023-03-14 NOTE — Progress Notes (Signed)
Kendra Day, D.O.  ABFM, ABOM Specializing in Clinical Bariatric Medicine  Office located at: [address]  [place], [place]  [id number]     Assessment and Plan:   No orders of the defined types were placed in this encounter.   Medications Discontinued During This Encounter  Medication Reason   buPROPion (WELLBUTRIN SR) 150 MG 12 hr tablet Reorder     Meds ordered this encounter  Medications   buPROPion (WELLBUTRIN SR) 150 MG 12 hr tablet    Sig: Take 1 tablet (150 mg total) by mouth daily. With brkfst    Dispense:  30 tablet    Refill:  0     Mood disorder (HCC), with emotional eating Assessment: Condition is stable. Mood is stable. No SI or HI. Her job has still been stressful. She has been meeting with Dr.Barker and it is helping her a lot with this condition. Patient is compliant with Wellbutrin SR 150 mg daily. Denies any side effects. She was sleeping well until she started having hot flashes. Thus, she has been taking Progesterone per her GYN.  Plan: Will refill this today. Continue with med. Continue meeting with Dr.Barker.   Continue her prudent nutritional plan that is low in simple carbohydrates, saturated fats and trans fats to goal of 5-10% weight loss to achieve significant health benefits.  Pt encouraged to continually advance exercise and cardiovascular fitness as tolerated throughout weight loss journey.    Pre-diabetes Assessment: Condition is Not optimized..  Lab Results  Component Value Date   HGBA1C 5.8 (H) 12/13/2022   HGBA1C 5.8 (H) 06/20/2022   HGBA1C 5.7 (H) 12/18/2021   INSULIN 9.2 12/13/2022   INSULIN 8.3 12/18/2021   INSULIN 6.4 08/22/2021  Patient is not on medication for pre-diabetes. This is diet controlled. Her cravings and hunger are controlled.   Plan: - Continue to decrease simple carbs/ sugars; increase fiber and proteins -> follow her meal plan.  - Kendra Day will continue to work on weight loss, exercise, via their meal  plan we devised to help decrease the risk of progressing to diabetes.   TREATMENT PLAN FOR OBESITY: BMI 32.0-32.9,adult-current bmi 32.0 Morbid obesity (HCC)-start bmi 39.77/date 10/12/20 Assessment: Condition is improving, but not optimized. Biometric data collected today, was reviewed with patient.  Fat mass has decreased by 2lb. Muscle mass has increased by .6lb. Total body water has decreased by 0.8lb.   Plan:  Kendra Day is currently in the action stage of change. As such, her goal is to continue weight management plan. Kendra Day will work on healthier eating habits and try their best to continue with the Category 2 Plan and keeping a food journal and adhering to recommended goals of 1300 calories and 110 protein  Behavioral Intervention Additional resources provided today: patient declined Evidence-based interventions for health behavior change were utilized today including the discussion of self monitoring techniques, problem-solving barriers and SMART goal setting techniques.   Regarding patient's less desirable eating habits and patterns, we employed the technique of small changes.  Pt will specifically work on: continuing with mindful eating  for next visit.    Recommended Physical Activity Goals She has agreed to Continue current level of physical activity   FOLLOW UP: Return in about 3 weeks (around 04/04/2023). She was informed of the importance of frequent follow up visits to maximize her success with intensive lifestyle modifications for her multiple health conditions.   Subjective:   Chief complaint: Obesity Kendra Day is here to discuss her progress with  her obesity treatment plan. She is on the the Category 2 Plan and keeping a food journal and adhering to recommended goals of 1300 calories and 110 protein and states she is following her eating plan approximately 65% of the time. She states she is walking 30 minutes 3-4 days per week.  Interval History:  Kendra Day  is here for a follow up office visit. Since last office visit she endorses that she has been doing really well with breakfast and lunch. She states that she eats the largest portion of food at lunch. She endorses that dinner has been more challenging to eat, according to plan, especially on the weekends. On the weekends, she sometimes eats out but tries to make healthier choices. She is drinking 100 oz+ of water daily.  We reviewed her meal plan and all questions were answered.  Review of Systems:  Pertinent positives were addressed with patient today.   Weight Summary and Biometrics   Weight Lost Since Last Visit: 1lb  Weight Gained Since Last Visit: 0   Vitals Temp: 98 F (36.7 C) BP: 135/76 Pulse Rate: (!) 57 SpO2: 100 %   Anthropometric Measurements Height: 5\' 5"  (1.651 m) Weight: 192 lb 3.2 oz (87.2 kg) BMI (Calculated): 31.98 Weight at Last Visit: 193lb Weight Lost Since Last Visit: 1lb Weight Gained Since Last Visit: 0 Starting Weight: 239lb Total Weight Loss (lbs): 47 lb (21.3 kg) Peak Weight: 244lb   Body Composition  Body Fat %: 42.4 % Fat Mass (lbs): 81.4 lbs Muscle Mass (lbs): 105.2 lbs Total Body Water (lbs): 79.4 lbs Visceral Fat Rating : 10   Other Clinical Data Fasting: no Labs: no Today's Visit #: 32 Starting Date: 10/12/20     Objective:   PHYSICAL EXAM: Blood pressure 135/76, pulse (!) 57, temperature 98 F (36.7 C), height 5\' 5"  (1.651 m), weight 192 lb 3.2 oz (87.2 kg), SpO2 100 %. Body mass index is 31.98 kg/m.  General: Well Developed, well nourished, and in no acute distress.  HEENT: Normocephalic, atraumatic Skin: Warm and dry, cap RF less 2 sec, good turgor Chest:  Normal excursion, shape, no gross abn Respiratory: speaking in full sentences, no conversational dyspnea NeuroM-Sk: Ambulates w/o assistance, moves * 4 Psych: A and O *3, insight good, mood-full  DIAGNOSTIC DATA REVIEWED:  BMET    Component Value Date/Time    NA 140 12/13/2022 1124   K 5.2 12/13/2022 1124   CL 102 12/13/2022 1124   CO2 24 12/13/2022 1124   GLUCOSE 95 12/13/2022 1124   GLUCOSE 104 (H) 02/21/2014 1124   BUN 30 (H) 12/13/2022 1124   CREATININE 1.08 (H) 12/13/2022 1124   CALCIUM 9.9 12/13/2022 1124   GFRNONAA 66 (L) 02/21/2014 1124   GFRAA 76 (L) 02/21/2014 1124   Lab Results  Component Value Date   HGBA1C 5.8 (H) 12/13/2022   HGBA1C 5.6 04/19/2021   Lab Results  Component Value Date   INSULIN 9.2 12/13/2022   INSULIN 10.8 10/12/2020   Lab Results  Component Value Date   TSH 0.681 02/08/2014   CBC    Component Value Date/Time   WBC 7.6 02/21/2014 1124   RBC 4.88 02/21/2014 1124   HGB 14.6 02/21/2014 1124   HCT 41.6 02/21/2014 1124   PLT 218 02/21/2014 1124   MCV 85.2 02/21/2014 1124   MCH 29.9 02/21/2014 1124   MCHC 35.1 02/21/2014 1124   RDW 12.6 02/21/2014 1124   Iron Studies No results found for: "IRON", "TIBC", "FERRITIN", "IRONPCTSAT"  Lipid Panel     Component Value Date/Time   CHOL 179 12/18/2021 0901   TRIG 86 12/18/2021 0901   HDL 68 12/18/2021 0901   CHOLHDL 2.6 12/18/2021 0901   LDLCALC 95 12/18/2021 0901   Hepatic Function Panel     Component Value Date/Time   PROT 7.0 12/18/2021 0901   ALBUMIN 4.5 12/18/2021 0901   AST 17 12/18/2021 0901   ALT 16 12/18/2021 0901   ALKPHOS 115 12/18/2021 0901   BILITOT 0.3 12/18/2021 0901      Component Value Date/Time   TSH 0.681 02/08/2014 1109   Nutritional Lab Results  Component Value Date   VD25OH 49.5 12/13/2022   VD25OH 70.6 06/20/2022   VD25OH 85.3 12/18/2021    Attestations:   Reviewed by clinician on day of visit: allergies, medications, problem list, medical history, surgical history, family history, social history, and previous encounter notes.   I,Special Puri,acting as a Neurosurgeon for [employer], DO.,have documented all relevant documentation on the behalf of Thomasene Lot, DO,as directed by  Thomasene Lot, DO while in  the presence of Thomasene Lot, DO.   I, Thomasene Lot, DO, have reviewed all documentation for this visit. The documentation on 03/14/23 for the exam, diagnosis, procedures, and orders are all accurate and complete.

## 2023-03-21 ENCOUNTER — Ambulatory Visit (INDEPENDENT_AMBULATORY_CARE_PROVIDER_SITE_OTHER): Payer: 59 | Admitting: Psychology

## 2023-03-21 DIAGNOSIS — F4321 Adjustment disorder with depressed mood: Secondary | ICD-10-CM | POA: Diagnosis not present

## 2023-03-21 NOTE — Progress Notes (Signed)
[facility] Counselor/Therapist Progress Note  Patient ID: Kendra Day, MRN: [id number],    Date: 03/21/2023  Time Spent: 60 mins  Treatment Type: Individual Therapy  Reported Symptoms: Pt presented for follow up session, via webex video.  Pt grants consent for session, stating she is in her office with no one else present.  I shared with pt that I am in my office with no one else present here either.    Mental Status Exam: Appearance:  Casual     Behavior: Appropriate  Motor: Normal  Speech/Language:  Clear and Coherent  Affect: Appropriate  Mood: normal  Thought process: normal  Thought content:   WNL  Sensory/Perceptual disturbances:   WNL  Orientation: oriented to person, place, and time/date  Attention: Good  Concentration: Good  Memory: WNL  Fund of knowledge:  Good  Insight:   Good  Judgment:  Good  Impulse Control: Good   Risk Assessment: Danger to Self:  No Self-injurious Behavior: No Danger to Others: No Duty to Warn:no Physical Aggression / Violence:No  Access to Firearms a concern: No  Gang Involvement:No   Subjective: Pt shares that "It has been crazy couple of weeks at work, we went to [place] this past weekend for [employer], and we just finished our busy season at work."  Pt shares she has been walking and has been using the [employer] and has been falling asleep with it at night and she has been listening to audio books as well.  Pt shares she has gotten to a point where she feels better about [name] and her interactions with him.  Pt feels a greater sense of peace about it.  She is now refusing to argue with him; [name] is being supportive of pt as well, even though he does not want to confront [name] in any way.  Pt shares that [name]'s court case resulted in him having unsupervised probation, an alcohol/drug assessment and follow the recommendations of the assessment, and community service; [name] is anxious to get this done as quickly as  possible so the charges will be dismissed when he finishes everything.  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  [name] quit his job at [employer] and they are not supporting him either.  Pt shares that she continues to walk the dog regularly and is listening to audio books as well.  Encouraged pt to continue with her self care activities and we will meet in 3 wks for a follow up session.   Interventions: Cognitive Behavioral Therapy  Diagnosis:Adjustment disorder with depressed mood  Plan: Treatment Plan Strengths/Abilities:  Intelligent, Intuitive, Willing to participate in therapy Treatment Preferences:  Outpatient Individual Therapy Statement of Needs:  Patient is to use CBT, mindfulness and coping skills to help manage and/or decrease symptoms associated with their diagnosis. Symptoms:  Depressed/Irritable mood, worry, social withdrawal Problems Addressed:  Depressive thoughts, Sadness, Sleep issues, etc. Long Term Goals:  Pt to reduce overall level, frequency, and intensity of the feelings of depression as evidenced by decreased irritability, negative self talk, and helpless feelings from 6 to 7 days/week to 0 to 1 days/week, per client report, for at least 3 consecutive months.  Progress: 30% Short Term Goals:  Pt to verbally express understanding of the relationship between feelings of depression and  their impact on thinking patterns and behaviors.  Pt to verbalize an understanding of the role that distorted thinking plays in creating fears, excessive worry, and ruminations.  Progress: 30% Target Date:  11/24/2023 Frequency:  Monthly Modality:  Cognitive Behavioral Therapy Interventions by Therapist:  Therapist will use CBT, Mindfulness exercises, Coping skills and Referrals, as needed by client. Client has verbally  approved this treatment plan.  Karie Kirks, [facility]

## 2023-03-23 ENCOUNTER — Other Ambulatory Visit (INDEPENDENT_AMBULATORY_CARE_PROVIDER_SITE_OTHER): Payer: Self-pay | Admitting: Family Medicine

## 2023-03-23 DIAGNOSIS — F39 Unspecified mood [affective] disorder: Secondary | ICD-10-CM

## 2023-04-01 ENCOUNTER — Telehealth (INDEPENDENT_AMBULATORY_CARE_PROVIDER_SITE_OTHER): Payer: 59 | Admitting: Psychology

## 2023-04-01 DIAGNOSIS — F411 Generalized anxiety disorder: Secondary | ICD-10-CM | POA: Diagnosis not present

## 2023-04-01 DIAGNOSIS — F5089 Other specified eating disorder: Secondary | ICD-10-CM | POA: Diagnosis not present

## 2023-04-01 DIAGNOSIS — F4320 Adjustment disorder, unspecified: Secondary | ICD-10-CM

## 2023-04-01 NOTE — Progress Notes (Signed)
  Office: [phone]  /  Fax: [phone]    Date: April 01, 2023    Appointment Start Time: 8:01am Duration: 29 minutes Provider: Lawerance Cruel, Psy.D. Type of Session: Individual Therapy  Location of Patient: Work (private location) Location of Provider: Provider's Home (private office) Type of Contact: Telepsychological Visit via MyChart Video Visit  Session Content: Samie is a 52 y.o. female presenting for a follow-up appointment to address the previously established treatment goal of increasing coping skills.Today's appointment was a telepsychological visit. Lynsee provided verbal consent for today's telepsychological appointment and she is aware she is responsible for securing confidentiality on her end of the session. Prior to proceeding with today's appointment, Margrette's physical location at the time of this appointment was obtained as well a phone number she could be reached at in the event of technical difficulties. Lucerito and this provider participated in today's telepsychological service.   This provider conducted a brief check-in. Tayana shared about recent events, including a work trip. She described an increase in self-care and improvement in eating habits since work-related stressors have reduced. Explored what she is currently doing that has helped improve eating habits that she can work toward implementing/adapting when work-related stressors increase. Reviewed self-compassion. She shared examples of engaging in self-compassion since the last appointment with this provider. This provider and Lacrystal explored the story her mind tells her about her weight loss/eating habits that is stuck on repeat. Remainder of the appointment focused on re-writing a more compassionate version. Overall, Jalena was receptive to today's appointment as evidenced by openness to sharing, responsiveness to feedback, and willingness to work toward increasing self-compassion.  Mental Status  Examination:  Appearance: neat Behavior: appropriate to circumstances Mood: neutral Affect: mood congruent Speech: WNL Eye Contact: appropriate Psychomotor Activity: WNL Gait: unable to assess Thought Process: linear, logical, and goal directed and no evidence or endorsement of suicidal, homicidal, and self-harm ideation, plan and intent  Thought Content/Perception: no hallucinations, delusions, bizarre thinking or behavior endorsed or observed Orientation: AAOx4 Memory/Concentration: memory, attention, language, and fund of knowledge intact  Insight: good Judgment: good  Interventions:  Conducted a brief chart review Provided empathic reflections and validation Reviewed content from the previous session Provided positive reinforcement Employed supportive psychotherapy interventions to facilitate reduced distress and to improve coping skills with identified stressors Engaged pt in a self-compassion exercise  DSM-5 Diagnosis(es):  F50.89 Other Specified Feeding or Eating Disorder, Emotional Eating Behaviors and F41.1 Generalized Anxiety Disorder, and F43.20 Adjustment Disorder, Unspecified  Treatment Goal & Progress: During the initial appointment with this provider, the following treatment goal was established: increase coping skills. Demri has demonstrated progress in her goal as evidenced by increased awareness of hunger patterns, increased awareness of triggers for emotional eating behaviors, and reduction in emotional eating behaviors . Oliver also continues to demonstrate willingness to engage in learned skill(s).  Plan: The next appointment is scheduled for 04/30/2023 at 8am, which will be via MyChart Video Visit. The next session will focus on working towards the established treatment goal and termination. Adelis will continue with her primary therapist.

## 2023-04-03 ENCOUNTER — Ambulatory Visit (INDEPENDENT_AMBULATORY_CARE_PROVIDER_SITE_OTHER): Payer: 59 | Admitting: Family Medicine

## 2023-04-03 ENCOUNTER — Encounter (INDEPENDENT_AMBULATORY_CARE_PROVIDER_SITE_OTHER): Payer: Self-pay | Admitting: Family Medicine

## 2023-04-03 VITALS — BP 121/70 | HR 65 | Temp 98.0°F | Ht 65.0 in | Wt 192.4 lb

## 2023-04-03 DIAGNOSIS — F39 Unspecified mood [affective] disorder: Secondary | ICD-10-CM | POA: Diagnosis not present

## 2023-04-03 DIAGNOSIS — E559 Vitamin D deficiency, unspecified: Secondary | ICD-10-CM

## 2023-04-03 DIAGNOSIS — R7303 Prediabetes: Secondary | ICD-10-CM

## 2023-04-03 DIAGNOSIS — Z6832 Body mass index (BMI) 32.0-32.9, adult: Secondary | ICD-10-CM

## 2023-04-03 MED ORDER — BUPROPION HCL ER (SR) 150 MG PO TB12: 150.0000 mg | ORAL_TABLET | Freq: Every day | ORAL | 0 refills | Status: DC

## 2023-04-03 NOTE — Progress Notes (Signed)
Kendra Day, D.O.  ABFM, ABOM Specializing in Clinical Bariatric Medicine  Office located at: [address] [place], [place]  [address]     Assessment and Plan:   Orders Placed This Encounter  Procedures   Hemoglobin A1c   Insulin, random   VITAMIN D 25 Hydroxy (Vit-D Deficiency, Fractures)   TSH   T4, free   Comprehensive metabolic panel    Medications Discontinued During This Encounter  Medication Reason   buPROPion (WELLBUTRIN SR) 150 MG 12 hr tablet Reorder     Meds ordered this encounter  Medications   buPROPion (WELLBUTRIN SR) 150 MG 12 hr tablet    Sig: Take 1 tablet (150 mg total) by mouth daily. With brkfst    Dispense:  30 tablet    Refill:  0    Patient will come in for fasting labs (A1c, Insulin, Vitamin D, TSH, T4, and CMP) prior to next OV    Pre-diabetes Assessment: Condition is not optimized. Lab Results  Component Value Date   HGBA1C 5.8 (H) 12/13/2022   HGBA1C 5.8 (H) 06/20/2022   HGBA1C 5.7 (H) 12/18/2021   INSULIN 9.2 12/13/2022   INSULIN 8.3 12/18/2021   INSULIN 6.4 08/22/2021  - She is currently not on any prediabetic medication. This condition is being managed through diet and weight loss.  - Hunger and cravings are controlled when eating on plan.   Plan: - Recheck labs. - Continue her prudent nutritional plan that is low in simple carbohydrates, saturated fats and trans fats to goal of 5-10% weight loss to achieve significant health benefits.  Pt encouraged to continually advance exercise and cardiovascular fitness as tolerated throughout weight loss journey.     Mood disorder (HCC), with emotional eating Assessment: Condition is stable. - Denies any SI/HI. Mood is stable. - Cravings and hunger are well controlled when eating on plan.  - She has been meeting with Dr.Barker, which she has been finding very helpful. Patient endorses that recently they discussed the importance of not being too hard on herself when eating off  plan.  - No issues with Wellbutrin SR 150 mg daily. Denies any side effects.  Plan:  - Recheck labs.  - Continue with med. Will refill Wellbutrin today.   - Continue meeting with Dr.Barker.  - Continue her prudent nutritional plan that is low in simple carbohydrates, saturated fats and trans fats to goal of 5-10% weight loss to achieve significant health benefits.   - We will continue to monitor closely alongside PCP / other specialists.    Vitamin D deficiency Assessment: Condition is stable.  Lab Results  Component Value Date   VD25OH 49.5 12/13/2022   VD25OH 70.6 06/20/2022   VD25OH 85.3 12/18/2021  - Her Vitamin D levels are almost within the goal of 50+ - Patient endorses that she has been off Vitamin D supplements recently.   Plan: - Recheck labs.  - weight loss will likely improve availability of vitamin D, thus encouraged [name] to continue with meal plan and their weight loss efforts to further improve this condition.      TREATMENT PLAN FOR OBESITY: BMI 32.0-32.9,adult-current bmi 32.02 Morbid obesity (HCC)-start bmi 39.77/date 10/12/20 Assessment: Condition is stable. Biometric data collected today, was reviewed with patient.  Fat mass has increased by 0.8 lb. Muscle mass has decreased by 0.6 lb. Total body water has increased by 1.8 lb.   Plan:  Versa is currently in the action stage of change. As such, her goal  is to continue weight management plan. [name] will work on [employer] habits and continue with the Category 2 meal plan.   Behavioral Intervention Additional resources provided today: Resistance Band Exercises Handout Evidence-based interventions for health behavior change were utilized today including the discussion of self monitoring techniques, problem-solving barriers and SMART goal setting techniques.   Regarding patient's less desirable eating habits and patterns, we employed the technique of small changes.  Pt will specifically work on:   continue walking but  ADD resistance training-  2 sets of 10, 2 days a week of resistance band exercises for next visit.    Recommended Physical Activity Goals [name] has been advised to work up to 150 minutes of moderate intensity aerobic activity a week and strengthening exercises 2-3 times per week for cardiovascular health, weight loss maintenance and preservation of muscle mass.  She has agreed to Continue current level of physical activity    FOLLOW UP: Return in about 3 weeks (around 04/24/2023) for Fasting Labs. She was informed of the importance of frequent follow up visits to maximize her success with intensive lifestyle modifications for her multiple health conditions.    Subjective:   Chief complaint: Obesity [name] is here to discuss her progress with her obesity treatment plan. She is on the Category 2 meal plan and states she is following her eating plan approximately 65% of the time. She states she is walking 30-40 minutes 4 days per week.  Interval History:  Kendra Day is here for a follow up office visit.  Since last office visit: - She has been walking more.  - She sleeps approximately 7-8 hrs daily.  - She went to a convention for work and tried her best to make mindful, healthy choices. She does endorse having alcohol and sweets on one occasion.  - When eating on plan, her hunger and cravings are well controlled.      Review of Systems:  Pertinent positives were addressed with patient today.   Weight Summary and Biometrics   Weight Lost Since Last Visit: 2 lb  Weight Gained Since Last Visit: 0    Vitals Temp: 98 F (36.7 C) BP: 121/70 Pulse Rate: 65 SpO2: 98 %   Anthropometric Measurements Height: 5\' 5"  (1.651 m) Weight: 192 lb 6.4 oz (87.3 kg) BMI (Calculated): 32.02 Weight at Last Visit: 192 lb Weight Lost Since Last Visit: 2 lb Weight Gained Since Last Visit: 0 Starting Weight: 239 lb Total Weight Loss (lbs): 49 lb (22.2  kg) Peak Weight: 244 lb   Body Composition  Body Fat %: 42.7 % Fat Mass (lbs): 82.2 lbs Muscle Mass (lbs): 104.6 lbs Total Body Water (lbs): 81.2 lbs Visceral Fat Rating : 10   Other Clinical Data Fasting: No Labs: No Today's Visit #: 33 Starting Date: 10/12/20   Objective:   PHYSICAL EXAM: Blood pressure 121/70, pulse 65, temperature 98 F (36.7 C), height 5\' 5"  (1.651 m), weight 192 lb 6.4 oz (87.3 kg), SpO2 98 %. Body mass index is 32.02 kg/m.  General: Well Developed, well nourished, and in no acute distress.  HEENT: Normocephalic, atraumatic Skin: Warm and dry, cap RF less 2 sec, good turgor Chest:  Normal excursion, shape, no gross abn Respiratory: speaking in full sentences, no conversational dyspnea NeuroM-Sk: Ambulates w/o assistance, moves * 4 Psych: A and O *3, insight good, mood-full  DIAGNOSTIC DATA REVIEWED:  BMET    Component Value Date/Time   NA 140 12/13/2022 1124   K 5.2 12/13/2022 1124  CL 102 12/13/2022 1124   CO2 24 12/13/2022 1124   GLUCOSE 95 12/13/2022 1124   GLUCOSE 104 (H) 02/21/2014 1124   BUN 30 (H) 12/13/2022 1124   CREATININE 1.08 (H) 12/13/2022 1124   CALCIUM 9.9 12/13/2022 1124   GFRNONAA 66 (L) 02/21/2014 1124   GFRAA 76 (L) 02/21/2014 1124   Lab Results  Component Value Date   HGBA1C 5.8 (H) 12/13/2022   HGBA1C 5.6 04/19/2021   Lab Results  Component Value Date   INSULIN 9.2 12/13/2022   INSULIN 10.8 10/12/2020   Lab Results  Component Value Date   TSH 0.681 02/08/2014   CBC    Component Value Date/Time   WBC 7.6 02/21/2014 1124   RBC 4.88 02/21/2014 1124   HGB 14.6 02/21/2014 1124   HCT 41.6 02/21/2014 1124   PLT 218 02/21/2014 1124   MCV 85.2 02/21/2014 1124   MCH 29.9 02/21/2014 1124   MCHC 35.1 02/21/2014 1124   RDW 12.6 02/21/2014 1124   Iron Studies No results found for: "IRON", "TIBC", "FERRITIN", "IRONPCTSAT" Lipid Panel     Component Value Date/Time   CHOL 179 12/18/2021 0901   TRIG 86  12/18/2021 0901   HDL 68 12/18/2021 0901   CHOLHDL 2.6 12/18/2021 0901   LDLCALC 95 12/18/2021 0901   Hepatic Function Panel     Component Value Date/Time   PROT 7.0 12/18/2021 0901   ALBUMIN 4.5 12/18/2021 0901   AST 17 12/18/2021 0901   ALT 16 12/18/2021 0901   ALKPHOS 115 12/18/2021 0901   BILITOT 0.3 12/18/2021 0901      Component Value Date/Time   TSH 0.681 02/08/2014 1109   Nutritional Lab Results  Component Value Date   VD25OH 49.5 12/13/2022   VD25OH 70.6 06/20/2022   VD25OH 85.3 12/18/2021    Attestations:   Reviewed by clinician on day of visit: allergies, medications, problem list, medical history, surgical history, family history, social history, and previous encounter notes.   I,Special Puri,acting as a Neurosurgeon for [employer], DO.,have documented all relevant documentation on the behalf of Thomasene Lot, DO,as directed by  Thomasene Lot, DO while in the presence of Thomasene Lot, DO.   I, Thomasene Lot, DO, have reviewed all documentation for this visit. The documentation on 04/03/23 for the exam, diagnosis, procedures, and orders are all accurate and complete.

## 2023-04-12 ENCOUNTER — Ambulatory Visit (INDEPENDENT_AMBULATORY_CARE_PROVIDER_SITE_OTHER): Payer: 59 | Admitting: Psychology

## 2023-04-12 DIAGNOSIS — F4321 Adjustment disorder with depressed mood: Secondary | ICD-10-CM | POA: Diagnosis not present

## 2023-04-12 NOTE — Progress Notes (Addendum)
[facility] Counselor/Therapist Progress Note  Patient ID: Kendra Day, MRN: [id number],    Date: 04/12/2023  Time Spent: 60 mins  Treatment Type: Individual Therapy  Reported Symptoms: Pt presented for follow up session, via Caregility video.  Pt grants consent for session, stating she is in her office with no one else present.  I shared with pt that I am in my office with no one else present here either.    Mental Status Exam: Appearance:  Casual     Behavior: Appropriate  Motor: Normal  Speech/Language:  Clear and Coherent  Affect: Appropriate  Mood: normal  Thought process: normal  Thought content:   WNL  Sensory/Perceptual disturbances:   WNL  Orientation: oriented to person, place, and time/date  Attention: Good  Concentration: Good  Memory: WNL  Fund of knowledge:  Good  Insight:   Good  Judgment:  Good  Impulse Control: Good   Risk Assessment: Danger to Self:  No Self-injurious Behavior: No Danger to Others: No Duty to Warn:no Physical Aggression / Violence:No  Access to Firearms a concern: No  Gang Involvement:No   Subjective: Pt shares that "I have been OK since our last session.  I lost my wallet on Tuesday and it turned back up today; I had already cancelled all of the credit cards and that was a pain."  Pt shares that she and the family are planning to go see her mom on Mother's Day (in [place]; she is 54 yo-still drives and lives in a retirement community); "most of my Mother's Days have been ruined by [employer] being a jerk."  She shares examples of how/what happened in the past.  Pt went to the beach this past weekend with 8 HS girlfriends and "it was a great weekend together."  Pt has also been walking their dog, she is also reading another book as well.  [name] came home from [facility] on Wednesday; he is planning to go to [place] to see his girlfriend in a week and a half; his girlfriend and her family are coming to [facility] for [name]' birthday on  7/4.  Pt is somewhat concerned about [name]'s behavior with them but she realizes that they will have to meet eventually.  Pt shares she has been sleeping pretty well since our last session.  [name] has gotten his drug/alcohol assessment, his required substance abuse classes, and his community service completed already.  He just needs to send documentation of those things to his attorney.  Encouraged pt to continue with her self care activities and we will meet in 3 wks for a follow up session.   Interventions: Cognitive Behavioral Therapy  Diagnosis:Adjustment disorder with depressed mood  Plan: Treatment Plan Strengths/Abilities:  Intelligent, Intuitive, Willing to participate in therapy Treatment Preferences:  Outpatient Individual Therapy Statement of Needs:  Patient is to use CBT, mindfulness and coping skills to help manage and/or decrease symptoms associated with their diagnosis. Symptoms:  Depressed/Irritable mood, worry, social withdrawal Problems Addressed:  Depressive thoughts, Sadness, Sleep issues, etc. Long Term Goals:  Pt to reduce overall level, frequency, and intensity of the feelings of depression as evidenced by decreased irritability, negative self talk, and helpless feelings from 6 to 7 days/week to 0 to 1 days/week, per client report, for at least 3 consecutive months.  Progress: 30% Short Term Goals:  Pt to verbally express understanding of the relationship between feelings of depression and their impact on thinking patterns and behaviors.  Pt to verbalize an understanding of  the role that distorted thinking plays in creating fears, excessive worry, and ruminations.  Progress: 30% Target Date:  11/24/2023 Frequency:  Monthly Modality:  Cognitive Behavioral Therapy Interventions by Therapist:  Therapist will use CBT, Mindfulness exercises, Coping skills and Referrals, as needed by client. Client has verbally approved this treatment plan.  Karie Kirks, [facility]

## 2023-04-23 LAB — T4, FREE: Free T4: 1.38 ng/dL (ref 0.82–1.77)

## 2023-04-23 LAB — INSULIN, RANDOM: INSULIN: 9.7 u[IU]/mL (ref 2.6–24.9)

## 2023-04-23 LAB — COMPREHENSIVE METABOLIC PANEL
ALT: 23 IU/L (ref 0–32)
AST: 21 IU/L (ref 0–40)
Albumin/Globulin Ratio: 1.8 (ref 1.2–2.2)
Albumin: 4.4 g/dL (ref 3.8–4.9)
Alkaline Phosphatase: 146 IU/L — ABNORMAL HIGH (ref 44–121)
BUN/Creatinine Ratio: 23 (ref 9–23)
BUN: 24 mg/dL (ref 6–24)
Bilirubin Total: 0.3 mg/dL (ref 0.0–1.2)
CO2: 24 mmol/L (ref 20–29)
Calcium: 10 mg/dL (ref 8.7–10.2)
Chloride: 105 mmol/L (ref 96–106)
Creatinine, Ser: 1.04 mg/dL — ABNORMAL HIGH (ref 0.57–1.00)
Globulin, Total: 2.5 g/dL (ref 1.5–4.5)
Glucose: 101 mg/dL — ABNORMAL HIGH (ref 70–99)
Potassium: 4.8 mmol/L (ref 3.5–5.2)
Sodium: 140 mmol/L (ref 134–144)
Total Protein: 6.9 g/dL (ref 6.0–8.5)
eGFR: 64 mL/min/{1.73_m2} (ref >59)

## 2023-04-23 LAB — VITAMIN D 25 HYDROXY (VIT D DEFICIENCY, FRACTURES): Vit D, 25-Hydroxy: 53.6 ng/mL (ref 30.0–100.0)

## 2023-04-23 LAB — TSH: TSH: 0.617 u[IU]/mL (ref 0.450–4.500)

## 2023-04-23 LAB — HEMOGLOBIN A1C
Est. average glucose Bld gHb Est-mCnc: 120 mg/dL
Hgb A1c MFr Bld: 5.8 % — ABNORMAL HIGH (ref 4.8–5.6)

## 2023-04-24 ENCOUNTER — Ambulatory Visit (INDEPENDENT_AMBULATORY_CARE_PROVIDER_SITE_OTHER): Payer: 59 | Admitting: Family Medicine

## 2023-04-24 ENCOUNTER — Encounter (INDEPENDENT_AMBULATORY_CARE_PROVIDER_SITE_OTHER): Payer: Self-pay | Admitting: Family Medicine

## 2023-04-24 VITALS — BP 117/74 | HR 66 | Temp 98.0°F | Ht 65.0 in | Wt 193.0 lb

## 2023-04-24 DIAGNOSIS — Z6832 Body mass index (BMI) 32.0-32.9, adult: Secondary | ICD-10-CM

## 2023-04-24 DIAGNOSIS — R7303 Prediabetes: Secondary | ICD-10-CM

## 2023-04-24 DIAGNOSIS — E559 Vitamin D deficiency, unspecified: Secondary | ICD-10-CM | POA: Diagnosis not present

## 2023-04-24 NOTE — Progress Notes (Signed)
Kendra Day, D.O.  ABFM, ABOM Specializing in Clinical Bariatric Medicine  Office located at: [address] [place], [place]  [address]     Assessment and Plan:   Vitamin D deficiency Assessment: Condition is stable. Labs were reviewed with patient today and education provided on them. All of the patient's questions about them were answered   Lab Results  Component Value Date   VD25OH 53.6 04/22/2023   VD25OH 49.5 12/13/2022   VD25OH 70.6 06/20/2022  - Labs indicate that her Vitamin D levels are within the recommended normal limits of 50-80.  - Pt is not on any Vitamin D supplement.   Plan: - weight loss will likely improve availability of vitamin D, thus encouraged Norely to continue with meal plan and their weight loss efforts to further improve this condition.  Thus, we will need to monitor levels regularly (every 3-4 mo on average) to keep levels within normal limits and prevent over supplementation.   Pre-diabetes Assessment: Condition is not optimized. Labs were reviewed with pt today and education provided on them and how the foods patient eats may influence these findings. All questions were answered about them.   Lab Results  Component Value Date   HGBA1C 5.8 (H) 04/22/2023   HGBA1C 5.8 (H) 12/13/2022   HGBA1C 5.8 (H) 06/20/2022   INSULIN 9.7 04/22/2023   INSULIN 9.2 12/13/2022   INSULIN 8.3 12/18/2021   Lab Results  Component Value Date   TSH 0.617 04/22/2023   FREET4 1.38 04/22/2023   Lab Results  Component Value Date   CREATININE 1.04 (H) 04/22/2023   BUN 24 04/22/2023   NA 140 04/22/2023   K 4.8 04/22/2023   CL 105 04/22/2023   CO2 24 04/22/2023      Component Value Date/Time   PROT 6.9 04/22/2023 0939   ALBUMIN 4.4 04/22/2023 0939   AST 21 04/22/2023 0939   ALT 23 04/22/2023 0939   ALKPHOS 146 (H) 04/22/2023 0939   BILITOT 0.3 04/22/2023 0939  eGFR was 64 on 04/22/23 Patient endorses feeling hungry in the afternoons. Labs  indicate that: - Her A1c levels have not changed since 12/13/2022.  - Her insulin levels have increased from 9.2 on 12/13/2022 to 9.7  - Thyroid function is stable. - GFR is above 60 and improving.  - Overall, labs indicate that kidney function is improving.   Plan: - We discussed Metformin which can help [name] in the management of this disease process as well as with weight loss. I advised pt to check with her kidney specialist if they have any objection to her starting Metformin.  - Ashby will continue to work on weight loss, exercise, via their meal plan we devised to help decrease the risk of progressing to diabetes.    TREATMENT PLAN FOR OBESITY: BMI 32.0-32.9,adult-current bmi 32.12 Morbid obesity (HCC)-start bmi 39.77/date 10/12/20 Assessment:  Kendra Day is here to discuss her progress with her obesity treatment plan along with follow-up of her obesity related diagnoses. See Medical Weight Management Flowsheet for complete bioelectrical impedance results.  Condition is not optimized. Biometric data collected today, was reviewed with patient.   Since last office visit on 04/03/23 patient's Fat mass has decreased by 10.4 lb. Muscle mass has increased by 10.4 lb. Total body water has increased by 3.4 lb.  Counseling done on how various foods will affect these numbers and how to maximize success  Total lbs lost to date: 46  Total weight loss percentage to date:  19.25   Plan:  - Continue with the Category 2 meal plan and begin journaling 1300-1400 calories and 90+ grams of protein daily.  -  Discussed the importance of journaling for increased mindfulness  Behavioral Intervention Additional resources provided today: lunch options and Metformin Handout Evidence-based interventions for health behavior change were utilized today including the discussion of self monitoring techniques, problem-solving barriers and SMART goal setting techniques.   Regarding patient's less desirable  eating habits and patterns, we employed the technique of small changes.  Pt will specifically work on: journaling her intake and bringing her food log for next visit.    Recommended Physical Activity Goals  Zyann has been advised to slowly work up to 150 minutes of moderate intensity aerobic activity a week and strengthening exercises 2-3 times per week for cardiovascular health, weight loss maintenance and preservation of muscle mass.   She has agreed to Continue current level of physical activity    FOLLOW UP: Return in about 3 weeks (around 05/15/2023). She was informed of the importance of frequent follow up visits to maximize her success with intensive lifestyle modifications for her multiple health conditions.   Subjective:   Chief complaint: Obesity Kendra Day is here to discuss her progress with her obesity treatment plan. She is on the the Category 2 Plan and states she is following her eating plan approximately 50% of the time. She states she is walking 30 minutes 5 days per week.  Interval History:  Kendra Day is here for a follow up office visit.     Since last office visit:   - Pt endorses that she had some stressors with her son.  - For breakfast she has waffles + protein powder, Fair life milk, and yogurt.  - Has hunger in the afternoons.  - The other 50% of the time, she endorses eating off plan foods and over eating.  - Lipid panel drawn on 01/11/23: Total Cholesterol 201, HDL 73, TRIG 107, and LDL 161   Review of Systems:  Pertinent positives were addressed with patient today.  Weight Summary and Biometrics   Weight Lost Since Last Visit: 0  Weight Gained Since Last Visit: 1lb    Vitals Temp: 98 F (36.7 C) BP: 117/74 Pulse Rate: 66 SpO2: 99 %   Anthropometric Measurements Height: 5\' 5"  (1.651 m) Weight: 193 lb (87.5 kg) BMI (Calculated): 32.12 Weight at Last Visit: 192lb Weight Lost Since Last Visit: 0 Weight Gained Since Last Visit:  1lb Starting Weight: 239lb Total Weight Loss (lbs): 48 lb (21.8 kg)   Body Composition  Body Fat %: 37.2 % Fat Mass (lbs): 71.8 lbs Muscle Mass (lbs): 115 lbs Total Body Water (lbs): 84.6 lbs Visceral Fat Rating : 9   Other Clinical Data Fasting: no Labs: no Today's Visit #: 34 Starting Date: 10/12/20     Objective:   PHYSICAL EXAM: Blood pressure 117/74, pulse 66, temperature 98 F (36.7 C), height 5\' 5"  (1.651 m), weight 193 lb (87.5 kg), SpO2 99 %. Body mass index is 32.12 kg/m.  General: Well Developed, well nourished, and in no acute distress.  HEENT: Normocephalic, atraumatic Skin: Warm and dry, cap RF less 2 sec, good turgor Chest:  Normal excursion, shape, no gross abn Respiratory: speaking in full sentences, no conversational dyspnea NeuroM-Sk: Ambulates w/o assistance, moves * 4 Psych: A and O *3, insight good, mood-full  DIAGNOSTIC DATA REVIEWED:  BMET    Component Value Date/Time   NA 140 04/22/2023 0939   K  4.8 04/22/2023 0939   CL 105 04/22/2023 0939   CO2 24 04/22/2023 0939   GLUCOSE 101 (H) 04/22/2023 0939   GLUCOSE 104 (H) 02/21/2014 1124   BUN 24 04/22/2023 0939   CREATININE 1.04 (H) 04/22/2023 0939   CALCIUM 10.0 04/22/2023 0939   GFRNONAA 66 (L) 02/21/2014 1124   GFRAA 76 (L) 02/21/2014 1124   Lab Results  Component Value Date   HGBA1C 5.8 (H) 04/22/2023   HGBA1C 5.6 04/19/2021   Lab Results  Component Value Date   INSULIN 9.7 04/22/2023   INSULIN 10.8 10/12/2020   Lab Results  Component Value Date   TSH 0.617 04/22/2023   CBC    Component Value Date/Time   WBC 7.6 02/21/2014 1124   RBC 4.88 02/21/2014 1124   HGB 14.6 02/21/2014 1124   HCT 41.6 02/21/2014 1124   PLT 218 02/21/2014 1124   MCV 85.2 02/21/2014 1124   MCH 29.9 02/21/2014 1124   MCHC 35.1 02/21/2014 1124   RDW 12.6 02/21/2014 1124   Iron Studies No results found for: "IRON", "TIBC", "FERRITIN", "IRONPCTSAT" Lipid Panel     Component Value Date/Time    CHOL 179 12/18/2021 0901   TRIG 86 12/18/2021 0901   HDL 68 12/18/2021 0901   CHOLHDL 2.6 12/18/2021 0901   LDLCALC 95 12/18/2021 0901   Hepatic Function Panel     Component Value Date/Time   PROT 6.9 04/22/2023 0939   ALBUMIN 4.4 04/22/2023 0939   AST 21 04/22/2023 0939   ALT 23 04/22/2023 0939   ALKPHOS 146 (H) 04/22/2023 0939   BILITOT 0.3 04/22/2023 0939      Component Value Date/Time   TSH 0.617 04/22/2023 0939   Nutritional Lab Results  Component Value Date   VD25OH 53.6 04/22/2023   VD25OH 49.5 12/13/2022   VD25OH 70.6 06/20/2022    Attestations:   Reviewed by clinician on day of visit: allergies, medications, problem list, medical history, surgical history, family history, social history, and previous encounter notes.    I,Special Puri,acting as a Neurosurgeon for [employer], DO.,have documented all relevant documentation on the behalf of Thomasene Lot, DO,as directed by  Thomasene Lot, DO while in the presence of Thomasene Lot, DO.   I, Thomasene Lot, DO, have reviewed all documentation for this visit. The documentation on 04/24/23 for the exam, diagnosis, procedures, and orders are all accurate and complete.

## 2023-04-25 ENCOUNTER — Encounter (INDEPENDENT_AMBULATORY_CARE_PROVIDER_SITE_OTHER): Payer: Self-pay | Admitting: Family Medicine

## 2023-04-30 ENCOUNTER — Telehealth (INDEPENDENT_AMBULATORY_CARE_PROVIDER_SITE_OTHER): Payer: 59 | Admitting: Psychology

## 2023-05-03 ENCOUNTER — Ambulatory Visit (INDEPENDENT_AMBULATORY_CARE_PROVIDER_SITE_OTHER): Payer: 59 | Admitting: Psychology

## 2023-05-03 DIAGNOSIS — F4321 Adjustment disorder with depressed mood: Secondary | ICD-10-CM | POA: Diagnosis not present
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
## 2023-05-14 ENCOUNTER — Telehealth (INDEPENDENT_AMBULATORY_CARE_PROVIDER_SITE_OTHER): Payer: 59 | Admitting: Psychology

## 2023-05-14 DIAGNOSIS — F5089 Other specified eating disorder: Secondary | ICD-10-CM

## 2023-05-14 DIAGNOSIS — F411 Generalized anxiety disorder: Secondary | ICD-10-CM | POA: Diagnosis not present

## 2023-05-14 DIAGNOSIS — F4320 Adjustment disorder, unspecified: Secondary | ICD-10-CM | POA: Diagnosis not present

## 2023-05-14 NOTE — Progress Notes (Signed)
  Office: [phone]  /  Fax: [phone]    Date: May 14, 2023  Appointment Start Time: 9:01am Duration: 27 minutes Provider: Lawerance Cruel, Psy.D. Type of Session: Individual Therapy  Location of Patient: Work (private location) Location of Provider: Provider's Home (private office) Type of Contact: Telepsychological Visit via MyChart Video Visit  Session Content: Kendra Day is a 52 y.o. female presenting for a follow-up appointment to address the previously established treatment goal of increasing coping skills.Today's appointment was a telepsychological visit. Kendra Day provided verbal consent for today's telepsychological appointment and she is aware she is responsible for securing confidentiality on her end of the session. Prior to proceeding with today's appointment, Kendra Day's physical location at the time of this appointment was obtained as well a phone number she could be reached at in the event of technical difficulties. Kendra Day and this provider participated in today's telepsychological service.   This provider conducted a brief check-in. [name] shared she has "been consistently tracking with [employer]." She continues to report a reduction in emotional eating behaviors. Kendra Day reflected on progress to date and reviewed learned skills. Overall, Kendra Day was receptive to today's appointment as evidenced by openness to sharing, responsiveness to feedback, and willingness to continue engaging in learned skills.  Mental Status Examination:  Appearance: neat Behavior: appropriate to circumstances Mood: neutral Affect: mood congruent Speech: WNL Eye Contact: appropriate Psychomotor Activity: WNL Gait: unable to assess Thought Process: linear, logical, and goal directed and no evidence or endorsement of suicidal, homicidal, and self-harm ideation, plan and intent  Thought Content/Perception: no hallucinations, delusions, bizarre thinking or behavior endorsed or  observed Orientation: AAOx4 Memory/Concentration: intact Insight: good Judgment: good  Interventions:  Conducted a brief chart review Provided empathic reflections and validation Provided positive reinforcement Employed supportive psychotherapy interventions to facilitate reduced distress and to improve coping skills with identified stressors Reviewed learned skills  DSM-5 Diagnosis(es):  F50.89 Other Specified Feeding or Eating Disorder, Emotional Eating Behaviors and F41.1 Generalized Anxiety Disorder, and F43.20 Adjustment Disorder, Unspecified  Treatment Goal & Progress: During the initial appointment with this provider, the following treatment goal was established: increase coping skills. Kendra Day demonstrated progress in her goal as evidenced by increased awareness of hunger patterns, increased awareness of triggers for emotional eating behaviors, and reduction in emotional eating behaviors . Kendra Day also continues to demonstrate willingness to engage in learned skill(s).  Plan: As previously planned, today was Kendra Day's last appointment with this provider. Kendra Day will continue with her primary therapist.  She acknowledged understanding that she may request a follow-up appointment with this provider in the future as long as she is still established with the clinic. No further follow-up planned by this provider.

## 2023-05-16 ENCOUNTER — Encounter (INDEPENDENT_AMBULATORY_CARE_PROVIDER_SITE_OTHER): Payer: Self-pay | Admitting: Family Medicine

## 2023-05-16 ENCOUNTER — Ambulatory Visit (INDEPENDENT_AMBULATORY_CARE_PROVIDER_SITE_OTHER): Payer: 59 | Admitting: Family Medicine

## 2023-05-16 VITALS — BP 117/76 | HR 69 | Temp 97.9°F | Ht 65.0 in | Wt 193.0 lb

## 2023-05-16 DIAGNOSIS — F39 Unspecified mood [affective] disorder: Secondary | ICD-10-CM

## 2023-05-16 DIAGNOSIS — Z6832 Body mass index (BMI) 32.0-32.9, adult: Secondary | ICD-10-CM

## 2023-05-16 DIAGNOSIS — E559 Vitamin D deficiency, unspecified: Secondary | ICD-10-CM | POA: Diagnosis not present

## 2023-05-16 MED ORDER — BUPROPION HCL ER (SR) 150 MG PO TB12: 150.0000 mg | ORAL_TABLET | Freq: Every day | ORAL | 0 refills | Status: DC

## 2023-05-16 NOTE — Progress Notes (Signed)
[name], D.O.  ABFM, ABOM Specializing in Clinical Bariatric Medicine  Office located at: [address] [place], [place]  [address]     Assessment and Plan:   Medications Discontinued During This Encounter  Medication Reason   buPROPion (WELLBUTRIN SR) 150 MG 12 hr tablet Reorder     Meds ordered this encounter  Medications   buPROPion (WELLBUTRIN SR) 150 MG 12 hr tablet    Sig: Take 1 tablet (150 mg total) by mouth daily. With brkfst    Dispense:  30 tablet    Refill:  0     Mood disorder (HCC), with emotional eating Assessment: Condition is stable. Denies any SI/HI. Mood is stable.Cravings and hunger are mostly well controlled. She reports good compliance and tolerance with Wellbutrin SR 150 mg daily. Denies any adverse effects.   Plan: Continue with mood medication Will refill this today.    Reminded patient of the importance of following their prudent nutrition plan and how food can affect mood as well to support emotional wellbeing.  We will continue to monitor closely alongside PCP / other specialists.    Vitamin D deficiency Assessment: Condition is stable. Klarisa is currently not taking any prescription or OTC Vitamin D.   Lab Results  Component Value Date   VD25OH 53.6 04/22/2023   VD25OH 49.5 12/13/2022   VD25OH 70.6 06/20/2022   Plan: Continue her prudent nutritional plan that is low in simple carbohydrates, saturated fats and trans fats to goal of 5-10% weight loss to achieve significant health benefits.  Pt encouraged to continually advance exercise and cardiovascular fitness as tolerated throughout weight loss journey.   Will continue to monitor levels regularly (every 3-4 mo on average) to keep levels within normal limits and prevent over supplementation.   TREATMENT PLAN FOR OBESITY: BMI 32.0-32.9,adult-current bmi 32.12 Morbid obesity (HCC)-start bmi 39.77/date 10/12/20 Assessment: ADRIAHNA RASSO is here to discuss her progress  with her obesity treatment plan along with follow-up of her obesity related diagnoses. See Medical Weight Management Flowsheet for complete bioelectrical impedance results.  Condition is not optimized. Biometric data collected today, was reviewed with patient.   Since last office visit on 04/24/23 patient's  Muscle mass has decreased by 9.4 lb. Fat mass has increased by 10 lb. Total body water has decreased by 1.4 lb.  Counseling done on how various foods will affect these numbers and how to maximize success  Total lbs lost to date: 46 Total weight loss percentage to date: 19.25   Plan: Continue with the Category 2 meal plan and begin journaling 1300-1400 calories and 90+ grams of protein daily.   - I recommended patient to try Black Bean Spaghetti or La Banderita tortillas to increase her daily fiber intake.   - I recommended patient to consider doing two days a week of strength training and increasing adherence of meal plan to 80%.    Behavioral Intervention Additional resources provided today: patient declined Evidence-based interventions for health behavior change were utilized today including the discussion of self monitoring techniques, problem-solving barriers and SMART goal setting techniques.   Regarding patient's less desirable eating habits and patterns, we employed the technique of small changes.  Pt will specifically work on: journaling her intake and bringing food log for next visit.    Recommended Physical Activity Goals  Galia has been advised to slowly work up to 150 minutes of moderate intensity aerobic activity a week and strengthening exercises 2-3 times per week for cardiovascular health,  weight loss maintenance and preservation of muscle mass.   She has agreed to Continue current level of physical activity   FOLLOW UP: Return in 3-4 wks. She was informed of the importance of frequent follow up visits to maximize her success with intensive lifestyle modifications  for her multiple health conditions.   Subjective:   Chief complaint: Obesity Kennesha is here to discuss her progress with her obesity treatment plan. She is on the the Category 2 Plan and keeping a food journal and adhering to recommended goals of 1300-1400 calories and 90+ protein and states she is following her eating plan approximately 75% of the time. She states she is walking 30 minutes 5-6 days per week.  Interval History:  ELISAMA THISSEN is here for a follow up office visit.     Since last office visit:    - Adisynn endorses that she has mostly been adhering to her calories and protein goals. She struggles to meet this goals on the weekends.   - She reports increasing her level of activity.   - She has been having issues with getting enough fiber. She typically gets 15-20 grams of fiber daily.   - She drinks approximately 100-120 ounces of water daily.  - Her hunger and cravings are mostly controlled.   - She has yet to contact her kidney specialist about Metformin.   Pharmacotherapy for weight loss: She is currently taking Wellbutrin for medical weight loss.  Denies side effects.    Review of Systems:  Pertinent positives were addressed with patient today.  Reviewed by clinician on day of visit: allergies, medications, problem list, medical history, surgical history, family history, social history, and previous encounter notes.  Weight Summary and Biometrics   Weight Lost Since Last Visit: 0  Weight Gained Since Last Visit: 0    Vitals Temp: 97.9 F (36.6 C) BP: 117/76 Pulse Rate: 69 SpO2: 100 %   Anthropometric Measurements Height: 5\' 5"  (1.651 m) Weight: 193 lb (87.5 kg) BMI (Calculated): 32.12 Weight at Last Visit: 193lb Weight Lost Since Last Visit: 0 Weight Gained Since Last Visit: 0 Starting Weight: 239lb Total Weight Loss (lbs): 46 lb (20.9 kg) Peak Weight: 244lb   Body Composition  Body Fat %: 42.4 % Fat Mass (lbs): 81.8 lbs Muscle  Mass (lbs): 105.6 lbs Total Body Water (lbs): 83.2 lbs Visceral Fat Rating : 10   Other Clinical Data Fasting: no Labs: no Today's Visit #: 35 Starting Date: 10/12/20   Objective:   PHYSICAL EXAM: Blood pressure 117/76, pulse 69, temperature 97.9 F (36.6 C), height 5\' 5"  (1.651 m), weight 193 lb (87.5 kg), SpO2 100 %. Body mass index is 32.12 kg/m.  General: Well Developed, well nourished, and in no acute distress.  HEENT: Normocephalic, atraumatic Skin: Warm and dry, cap RF less 2 sec, good turgor Chest:  Normal excursion, shape, no gross abn Respiratory: speaking in full sentences, no conversational dyspnea NeuroM-Sk: Ambulates w/o assistance, moves * 4 Psych: A and O *3, insight good, mood-full  DIAGNOSTIC DATA REVIEWED:  BMET    Component Value Date/Time   NA 140 04/22/2023 0939   K 4.8 04/22/2023 0939   CL 105 04/22/2023 0939   CO2 24 04/22/2023 0939   GLUCOSE 101 (H) 04/22/2023 0939   GLUCOSE 104 (H) 02/21/2014 1124   BUN 24 04/22/2023 0939   CREATININE 1.04 (H) 04/22/2023 0939   CALCIUM 10.0 04/22/2023 0939   GFRNONAA 66 (L) 02/21/2014 1124   GFRAA 76 (L) 02/21/2014 1124  Lab Results  Component Value Date   HGBA1C 5.8 (H) 04/22/2023   HGBA1C 5.6 04/19/2021   Lab Results  Component Value Date   INSULIN 9.7 04/22/2023   INSULIN 10.8 10/12/2020   Lab Results  Component Value Date   TSH 0.617 04/22/2023   CBC    Component Value Date/Time   WBC 7.6 02/21/2014 1124   RBC 4.88 02/21/2014 1124   HGB 14.6 02/21/2014 1124   HCT 41.6 02/21/2014 1124   PLT 218 02/21/2014 1124   MCV 85.2 02/21/2014 1124   MCH 29.9 02/21/2014 1124   MCHC 35.1 02/21/2014 1124   RDW 12.6 02/21/2014 1124   Iron Studies No results found for: "IRON", "TIBC", "FERRITIN", "IRONPCTSAT" Lipid Panel     Component Value Date/Time   CHOL 201 (A) 01/11/2023 0000   CHOL 179 12/18/2021 0901   TRIG 107 01/11/2023 0000   HDL 68 12/18/2021 0901   CHOLHDL 2.6 12/18/2021 0901    LDLCALC 95 12/18/2021 0901   Hepatic Function Panel     Component Value Date/Time   PROT 6.9 04/22/2023 0939   ALBUMIN 4.4 04/22/2023 0939   AST 21 04/22/2023 0939   ALT 23 04/22/2023 0939   ALKPHOS 146 (H) 04/22/2023 0939   BILITOT 0.3 04/22/2023 0939      Component Value Date/Time   TSH 0.617 04/22/2023 0939   Nutritional Lab Results  Component Value Date   VD25OH 53.6 04/22/2023   VD25OH 49.5 12/13/2022   VD25OH 70.6 06/20/2022    Attestations:   I, Special Puri, acting as a Stage manager for [employer], DO., have compiled all relevant documentation for today's office visit on behalf of Thomasene Lot, DO, while in the presence of [employer], DO.  I have reviewed the above documentation for accuracy and completeness, and I agree with the above. [name], D.O.  The 21st Century Cures Act was signed into law in 2016 which includes the topic of electronic health records.  This provides immediate access to information in MyChart.  This includes consultation notes, operative notes, office notes, lab results and pathology reports.  If you have any questions about what you read please let us know at your next visit so we can discuss your concerns and take corrective action if need be.  We are right here with you.

## 2023-05-22 ENCOUNTER — Ambulatory Visit (INDEPENDENT_AMBULATORY_CARE_PROVIDER_SITE_OTHER): Payer: 59 | Admitting: Psychology

## 2023-05-22 DIAGNOSIS — F4321 Adjustment disorder with depressed mood: Secondary | ICD-10-CM

## 2023-05-22 NOTE — Progress Notes (Signed)
[facility] Counselor/Therapist Progress Note  Patient ID: LONDAN LEIFERMAN, MRN: [id number],    Date: 05/22/2023  Time Spent: 45 mins  Treatment Type: Individual Therapy  Reported Symptoms: Pt presented for follow up session, via Caregility video.  Pt grants consent for session, stating she is in her home with no one else present.  I shared with pt that I am in my office with no one else present here either.    Mental Status Exam: Appearance:  Casual     Behavior: Appropriate  Motor: Normal  Speech/Language:  Clear and Coherent  Affect: Appropriate  Mood: normal  Thought process: normal  Thought content:   WNL  Sensory/Perceptual disturbances:   WNL  Orientation: oriented to person, place, and time/date  Attention: Good  Concentration: Good  Memory: WNL  Fund of knowledge:  Good  Insight:   Good  Judgment:  Good  Impulse Control: Good   Risk Assessment: Danger to Self:  No Self-injurious Behavior: No Danger to Others: No Duty to Warn:no Physical Aggression / Violence:No  Access to Firearms a concern: No  Gang Involvement:No   Subjective: Pt shares that "I have been pretty good since our last session.  Things are always a whirlwind with [name] on Sundays; Mother's Day and Father's Day were tough days which we think might have to do with issues that [name] has with his adoption."  Pt shares that she and [name] have decided to give $12,000.00 and make arrangements for him to move out of the house.  He is in [place] right now looking for an apt to share with a friend who have kicked him out of his family home; the friend would rather live in [place] because it is cheaper but [name] is adamant about only living in [place].  Pt shares that she is concerned about [name]'s ability to manage the year away and [name]'s ability to stick to the deal as well.  Pt shares she "feels OK about the deal but I do not feel great about how [name] continues to treat me, Edrick Oh, etc.  We need to  find something other than what we are doing now."  Pt shares that [name]' girlfriend and her family are coming to visit them over the 4th of July holiday and pt is concerned about how [name] will act while they are there.  [name] has told pt that he would never act out around people that he does not know.  Pt shares she continues to spend time with friends, she has spent time with [name] (they are going to a concert this weekend), she continues to work on puzzles, she is reading and walking their dog regularly as well.  She shares that work is going pretty well; it is busier than normal because she has gotten two new client companies lately.  Pt shares that she feels like she has gotten better about giving up anxiety about things she cannot control and she likes the freedom that this provides for her.  Encouraged pt to continue with her self care activities and we will meet in 3 wks for a follow up session.   Interventions: Cognitive Behavioral Therapy  Diagnosis:Adjustment disorder with depressed mood  Plan: Treatment Plan Strengths/Abilities:  Intelligent, Intuitive, Willing to participate in therapy Treatment Preferences:  Outpatient Individual Therapy Statement of Needs:  Patient is to use CBT, mindfulness and coping skills to help manage and/or decrease symptoms associated with their diagnosis. Symptoms:  Depressed/Irritable mood, worry, social withdrawal Problems Addressed:  Depressive thoughts, Sadness, Sleep issues, etc. Long Term Goals:  Pt to reduce overall level, frequency, and intensity of the feelings of depression as evidenced by decreased irritability, negative self talk, and helpless feelings from 6 to 7 days/week to 0 to 1 days/week, per client report, for at least 3 consecutive months.  Progress: 30% Short Term Goals:  Pt to verbally express understanding of the relationship between feelings of depression and their impact on thinking patterns and behaviors.  Pt to verbalize an understanding  of the role that distorted thinking plays in creating fears, excessive worry, and ruminations.  Progress: 30% Target Date:  11/24/2023 Frequency:  Monthly Modality:  Cognitive Behavioral Therapy Interventions by Therapist:  Therapist will use CBT, Mindfulness exercises, Coping skills and Referrals, as needed by client. Client has verbally approved this treatment plan.  Karie Kirks, [facility]

## 2023-06-10 ENCOUNTER — Ambulatory Visit (INDEPENDENT_AMBULATORY_CARE_PROVIDER_SITE_OTHER): Payer: 59 | Admitting: Family Medicine

## 2023-06-10 ENCOUNTER — Encounter (INDEPENDENT_AMBULATORY_CARE_PROVIDER_SITE_OTHER): Payer: Self-pay | Admitting: Family Medicine

## 2023-06-10 VITALS — BP 116/76 | HR 64 | Temp 97.8°F | Ht 65.0 in | Wt 191.0 lb

## 2023-06-10 DIAGNOSIS — Z6832 Body mass index (BMI) 32.0-32.9, adult: Secondary | ICD-10-CM

## 2023-06-10 DIAGNOSIS — Z6831 Body mass index (BMI) 31.0-31.9, adult: Secondary | ICD-10-CM

## 2023-06-10 DIAGNOSIS — R7303 Prediabetes: Secondary | ICD-10-CM

## 2023-06-10 DIAGNOSIS — F39 Unspecified mood [affective] disorder: Secondary | ICD-10-CM

## 2023-06-10 MED ORDER — BUPROPION HCL ER (SR) 150 MG PO TB12: 150.0000 mg | ORAL_TABLET | Freq: Every day | ORAL | 0 refills | Status: DC

## 2023-06-10 NOTE — Progress Notes (Signed)
Kendra Day, D.O.  ABFM, ABOM Specializing in Clinical Bariatric Medicine  Office located at: [address] [place], [place]  [address]     Assessment and Plan:   Medications Discontinued During This Encounter  Medication Reason   buPROPion (WELLBUTRIN SR) 150 MG 12 hr tablet Reorder    Meds ordered this encounter  Medications   buPROPion (WELLBUTRIN SR) 150 MG 12 hr tablet    Sig: Take 1 tablet (150 mg total) by mouth daily. With brkfst    Dispense:  30 tablet    Refill:  0   Pre-diabetes Assessment: Condition is Not at goal. Her A1c has been consistent at 5.8 for the past year. This is diet/exercise controlled. Her hunger and craving are better controlled when following our prudent meal plan.   Lab Results  Component Value Date   HGBA1C 5.8 (H) 04/22/2023   HGBA1C 5.8 (H) 12/13/2022   HGBA1C 5.8 (H) 06/20/2022   INSULIN 9.7 04/22/2023   INSULIN 9.2 12/13/2022   INSULIN 8.3 12/18/2021     Plan: Kendra Day will continue to work on weight loss, exercise, via their meal plan we devised to help decrease the risk of progressing to diabetes. We will recheck A1c and fasting insulin level in approximately 3 months from last check, or as deemed appropriate.    Mood disorder (HCC), with emotional eating Assessment: Condition is Controlled. She continues Wellbutrin 150 mg once daily without side effects. Denies any SI/HI. Mood is stable.Cravings and hunger are pretty well controlled.   Plan:  She will continue Wellbutrin 150mg  daily. Will refill this today. Continue her prudent nutritional plan that is low in simple carbohydrates, saturated fats and trans fats to goal of 5-10% weight loss to achieve significant health benefits.  Pt encouraged to continually advance exercise and cardiovascular fitness as tolerated throughout weight loss journey.   TREATMENT PLAN FOR OBESITY: BMI 32.0-32.9,adult-current bmi 31.78 Morbid obesity (HCC)-start bmi 31.78 Assessment:   Kendra Day is here to discuss her progress with her obesity treatment plan along with follow-up of her obesity related diagnoses. See Medical Weight Management Flowsheet for complete bioelectrical impedance results. I advised her to increase her fiber intake.   Condition is docourse: improving but not optimized. Biometric data collected today, was reviewed with patient.   Since last office visit on 05/16/2023 patient's  Muscle mass has decreased by 0.4lb. Fat mass has decreased by 1.4lb. Total body water has decreased by 2lb.  Counseling done on how various foods will affect these numbers and how to maximize success  Total lbs lost to date: 48 Total weight loss percentage to date: -20.08%   Plan:  Continue with the Category 2 meal plan and begin journaling if she desires, 1300-1400 calories and 90+ grams of protein daily.  Behavioral Intervention Additional resources provided today: Patient declined.  Evidence-based interventions for health behavior change were utilized today including the discussion of self monitoring techniques, problem-solving barriers and SMART goal setting techniques.   Regarding patient's less desirable eating habits and patterns, we employed the technique of small changes.  Pt will specifically work on: 2 days of strength training or resistance band exercises if she desires for next visit.    Recommended Physical Activity Goals  Kendra Day has been advised to slowly work up to 150 minutes of moderate intensity aerobic activity a week and strengthening exercises 2-3 times per week for cardiovascular health, weight loss maintenance and preservation of muscle mass.   She has agreed to Think  about ways to increase daily physical activity and overcoming barriers to exercise.  FOLLOW UP: Return in about 3 weeks (around 07/01/2023). She was informed of the importance of frequent follow up visits to maximize her success with intensive lifestyle modifications for her  multiple health conditions.  Subjective:   Chief complaint: Obesity Kendra Day is here to discuss her progress with her obesity treatment plan. She is on the Category 2 Plan and states she is following her eating plan approximately 40% of the time. She states she is walking 30 minutes 5 days per week.  Interval History:  Kendra Day is here for a follow up office visit.  Since last OV her personal life has been busy to the point that she has been too occupied everything on the meal plan at times.  She has been thinking about joining the gym within the month or so. She reports drinking at least 100oz daily.   Pharmacotherapy for weight loss: She is currently taking Wellbutrin for medical weight loss.  Denies side effects.    Review of Systems:  Pertinent positives were addressed with patient today.  Reviewed by clinician on day of visit: allergies, medications, problem list, medical history, surgical history, family history, social history, and previous encounter notes.  Weight Summary and Biometrics   Weight Lost Since Last Visit: 2lb  Weight Gained Since Last Visit: 0   Vitals Temp: 97.8 F (36.6 C) BP: 116/76 Pulse Rate: 64 SpO2: 100 %   Anthropometric Measurements Height: 5\' 5"  (1.651 m) Weight: 191 lb (86.6 kg) BMI (Calculated): 31.78 Weight at Last Visit: 193lb Weight Lost Since Last Visit: 2lb Weight Gained Since Last Visit: 0 Starting Weight: 239lb Total Weight Loss (lbs): 48 lb (21.8 kg) Peak Weight: 244lb   Body Composition  Body Fat %: 42.1 % Fat Mass (lbs): 80.4 lbs Muscle Mass (lbs): 105.2 lbs Total Body Water (lbs): 81.2 lbs Visceral Fat Rating : 10   Other Clinical Data Fasting: no Labs: no Today's Visit #: 36 Starting Date: 10/12/20     Objective:   PHYSICAL EXAM: Blood pressure 116/76, pulse 64, temperature 97.8 F (36.6 C), height 5\' 5"  (1.651 m), weight 191 lb (86.6 kg), SpO2 100 %. Body mass index is 31.78 kg/m.  General:  Well Developed, well nourished, and in no acute distress.  HEENT: Normocephalic, atraumatic Skin: Warm and dry, cap RF less 2 sec, good turgor Chest:  Normal excursion, shape, no gross abn Respiratory: speaking in full sentences, no conversational dyspnea NeuroM-Sk: Ambulates w/o assistance, moves * 4 Psych: A and O *3, insight good, mood-full  DIAGNOSTIC DATA REVIEWED:  BMET    Component Value Date/Time   NA 140 04/22/2023 0939   K 4.8 04/22/2023 0939   CL 105 04/22/2023 0939   CO2 24 04/22/2023 0939   GLUCOSE 101 (H) 04/22/2023 0939   GLUCOSE 104 (H) 02/21/2014 1124   BUN 24 04/22/2023 0939   CREATININE 1.04 (H) 04/22/2023 0939   CALCIUM 10.0 04/22/2023 0939   GFRNONAA 66 (L) 02/21/2014 1124   GFRAA 76 (L) 02/21/2014 1124   Lab Results  Component Value Date   HGBA1C 5.8 (H) 04/22/2023   HGBA1C 5.6 04/19/2021   Lab Results  Component Value Date   INSULIN 9.7 04/22/2023   INSULIN 10.8 10/12/2020   Lab Results  Component Value Date   TSH 0.617 04/22/2023   CBC    Component Value Date/Time   WBC 7.6 02/21/2014 1124   RBC 4.88 02/21/2014 1124   HGB  14.6 02/21/2014 1124   HCT 41.6 02/21/2014 1124   PLT 218 02/21/2014 1124   MCV 85.2 02/21/2014 1124   MCH 29.9 02/21/2014 1124   MCHC 35.1 02/21/2014 1124   RDW 12.6 02/21/2014 1124   Iron Studies No results found for: "IRON", "TIBC", "FERRITIN", "IRONPCTSAT" Lipid Panel     Component Value Date/Time   CHOL 201 (A) 01/11/2023 0000   CHOL 179 12/18/2021 0901   TRIG 107 01/11/2023 0000   HDL 68 12/18/2021 0901   CHOLHDL 2.6 12/18/2021 0901   LDLCALC 95 12/18/2021 0901   Hepatic Function Panel     Component Value Date/Time   PROT 6.9 04/22/2023 0939   ALBUMIN 4.4 04/22/2023 0939   AST 21 04/22/2023 0939   ALT 23 04/22/2023 0939   ALKPHOS 146 (H) 04/22/2023 0939   BILITOT 0.3 04/22/2023 0939      Component Value Date/Time   TSH 0.617 04/22/2023 0939   Nutritional Lab Results  Component Value Date    VD25OH 53.6 04/22/2023   VD25OH 49.5 12/13/2022   VD25OH 70.6 06/20/2022    Attestations:   I, Special Puri, acting as a Stage manager for [employer], DO., have compiled all relevant documentation for today's office visit on behalf of Thomasene Lot, DO, while in the presence of [employer], DO.  I have reviewed the above documentation for accuracy and completeness, and I agree with the above. Kendra Day, D.O.  The 21st Century Cures Act was signed into law in 2016 which includes the topic of electronic health records.  This provides immediate access to information in MyChart.  This includes consultation notes, operative notes, office notes, lab results and pathology reports.  If you have any questions about what you read please let us know at your next visit so we can discuss your concerns and take corrective action if need be.  We are right here with you.

## 2023-06-12 ENCOUNTER — Ambulatory Visit (INDEPENDENT_AMBULATORY_CARE_PROVIDER_SITE_OTHER): Payer: 59 | Admitting: Psychology

## 2023-06-12 DIAGNOSIS — F4321 Adjustment disorder with depressed mood: Secondary | ICD-10-CM | POA: Diagnosis not present

## 2023-06-12 NOTE — Progress Notes (Signed)
[facility] Counselor/Therapist Progress Note  Patient ID: Kendra Day, MRN: [id number],    Date: 06/12/2023  Time Spent: 45 mins  start time: 1500   end time: 1545  Treatment Type: Individual Therapy  Reported Symptoms: Pt presented for follow up session, via Caregility video.  Pt grants consent for session, stating she is in her home with no one else present; pt states she understands the limits of virtual session.  I shared with pt that I am in my office with no one else present here either.    Mental Status Exam: Appearance:  Casual     Behavior: Appropriate  Motor: Normal  Speech/Language:  Clear and Coherent  Affect: Appropriate  Mood: normal  Thought process: normal  Thought content:   WNL  Sensory/Perceptual disturbances:   WNL  Orientation: oriented to person, place, and time/date  Attention: Good  Concentration: Good  Memory: WNL  Fund of knowledge:  Good  Insight:   Good  Judgment:  Good  Impulse Control: Good   Risk Assessment: Danger to Self:  No Self-injurious Behavior: No Danger to Others: No Duty to Warn:no Physical Aggression / Violence:No  Access to Firearms a concern: No  Gang Involvement:No   Subjective: Pt shares that "I have been pretty good since our last session.  We have been much busier than usual for the summer.  Lots of compliance issues."  Pt shares that not much has changed with [name] since our last session.  "My dogs were terrible the whole weekend that [name]' girlfriend and her family were in town here.  [name] said hello to them once but was scarce after that.  We had a lovely visit with them."  Pt shares that she and [name] are going with her sisters to see their uncle in [place] tomorrow through the weekend.  When they are gone, [name] and [name] will be in the home together and there are thoughts about how that may go.  When they get back, they plan to set [name] up in an apt and pay his rent for a year and have him out of the house.   They are both just tired of dealing with [name] in his current circumstance; they believe they have both done everything they can for him at this point.  [name] has applied for jobs but has chosen not to do the jobs he has been offered.  Pt and [name] continue to work on puzzles as a means of self care.  Pt shares she continues to spend time with friends, she is reading and walking their dog regularly as well.  Encouraged pt to continue with her self care activities and we will meet in 3 wks for a follow up session.   Interventions: Cognitive Behavioral Therapy  Diagnosis:Adjustment disorder with depressed mood  Plan: Treatment Plan Strengths/Abilities:  Intelligent, Intuitive, Willing to participate in therapy Treatment Preferences:  Outpatient Individual Therapy Statement of Needs:  Patient is to use CBT, mindfulness and coping skills to help manage and/or decrease symptoms associated with their diagnosis. Symptoms:  Depressed/Irritable mood, worry, social withdrawal Problems Addressed:  Depressive thoughts, Sadness, Sleep issues, etc. Long Term Goals:  Pt to reduce overall level, frequency, and intensity of the feelings of depression as evidenced by decreased irritability, negative self talk, and helpless feelings from 6 to 7 days/week to 0 to 1 days/week, per client report, for at least 3 consecutive months.  Progress: 30% Short Term Goals:  Pt to verbally express understanding of the  relationship between feelings of depression and their impact on thinking patterns and behaviors.  Pt to verbalize an understanding of the role that distorted thinking plays in creating fears, excessive worry, and ruminations.  Progress: 30% Target Date:  11/24/2023 Frequency:  Monthly Modality:  Cognitive Behavioral Therapy Interventions by Therapist:  Therapist will use CBT, Mindfulness exercises, Coping skills and Referrals, as needed by client. Client has verbally approved this treatment plan.  Karie Kirks,  [facility]

## 2023-06-16 ENCOUNTER — Other Ambulatory Visit (INDEPENDENT_AMBULATORY_CARE_PROVIDER_SITE_OTHER): Payer: Self-pay | Admitting: Family Medicine

## 2023-06-16 DIAGNOSIS — F39 Unspecified mood [affective] disorder: Secondary | ICD-10-CM

## 2023-07-01 ENCOUNTER — Ambulatory Visit (INDEPENDENT_AMBULATORY_CARE_PROVIDER_SITE_OTHER): Payer: 59 | Admitting: Family Medicine

## 2023-07-01 ENCOUNTER — Encounter (INDEPENDENT_AMBULATORY_CARE_PROVIDER_SITE_OTHER): Payer: Self-pay | Admitting: Family Medicine

## 2023-07-01 VITALS — BP 129/83 | HR 68 | Temp 98.5°F | Ht 65.0 in | Wt 192.0 lb

## 2023-07-01 DIAGNOSIS — R7303 Prediabetes: Secondary | ICD-10-CM

## 2023-07-01 DIAGNOSIS — Z6831 Body mass index (BMI) 31.0-31.9, adult: Secondary | ICD-10-CM | POA: Diagnosis not present

## 2023-07-01 DIAGNOSIS — Z6832 Body mass index (BMI) 32.0-32.9, adult: Secondary | ICD-10-CM

## 2023-07-01 DIAGNOSIS — F39 Unspecified mood [affective] disorder: Secondary | ICD-10-CM | POA: Diagnosis not present

## 2023-07-01 MED ORDER — BUPROPION HCL ER (SR) 150 MG PO TB12
150.0000 mg | ORAL_TABLET | Freq: Every day | ORAL | 0 refills | Status: DC
Start: 2023-07-01 — End: 2023-08-13

## 2023-07-01 NOTE — Progress Notes (Signed)
[name], D.O.  ABFM, ABOM Specializing in Clinical Bariatric Medicine  Office located at: [address] [place], [place]  [address]     Assessment and Plan:   Medications Discontinued During This Encounter  Medication Reason   buPROPion (WELLBUTRIN SR) 150 MG 12 hr tablet Reorder    Meds ordered this encounter  Medications   buPROPion (WELLBUTRIN SR) 150 MG 12 hr tablet    Sig: Take 1 tablet (150 mg total) by mouth daily. With brkfst    Dispense:  30 tablet    Refill:  0    Mood disorder (HCC), with emotional eating Assessment: Condition is Controlled.. Denies any SI/HI. Mood is stable. She continue Wellbutrin  Cravings and hunger are well controlled. She notes having no urge to emotionally eat.   Plan: - Continue with Wellbutrin 150mg  once daily. Will refill this today.   - Reminded patient of the importance of following their prudent nutrition plan.   - We will continue to monitor closely alongside PCP    Pre-diabetes Assessment: Condition is Not optimized.. This is diet/exercise controlled. She denies hunger and cravings as long as she follows our prescribed nutritional plan. Lab Results  Component Value Date   HGBA1C 5.8 (H) 04/22/2023   HGBA1C 5.8 (H) 12/13/2022   HGBA1C 5.8 (H) 06/20/2022   INSULIN 9.7 04/22/2023   INSULIN 9.2 12/13/2022   INSULIN 8.3 12/18/2021    Plan: - Continue her prudent nutritional plan that is low in simple carbohydrates, saturated fats and trans fats to goal of 5-10% weight loss to achieve significant health benefits.  Pt encouraged to continually advance exercise and cardiovascular fitness as tolerated throughout weight loss journey.    - Anticipatory guidance given.    - We will recheck A1c and fasting insulin level in approximately 3 months from last check, or as deemed appropriate.    TREATMENT PLAN FOR OBESITY: BMI 32.0-32.9,adult-current bmi 31.95 Morbid obesity (HCC)-start bmi 39.77/date  10/12/20 Assessment:  Kendra Day is here to discuss her progress with her obesity treatment plan along with follow-up of her obesity related diagnoses. See Medical Weight Management Flowsheet for complete bioelectrical impedance results.  Condition is improving but not optimized. Biometric data collected today, was reviewed with patient.   Since last office visit on 06/10/2023 patient's  Muscle mass has increased by 4.8lb. Fat mass has decreased by 5lb. Total body water has increased by 5.2lb.  Counseling done on how various foods will affect these numbers and how to maximize success  Total lbs lost to date: 47 Total weight loss percentage to date: 19.67   Plan: Continue to follow the prescribed Category 2 meal plan and begin journaling if she desires with the recommended goal of 1300-1400 calories and 90+ grams of protein daily.   - I reminded pt about following the 10:1 ratio.   Behavioral Intervention Additional resources provided today: patient declined Evidence-based interventions for health behavior change were utilized today including the discussion of self monitoring techniques, problem-solving barriers and SMART goal setting techniques.   Regarding patient's less desirable eating habits and patterns, we employed the technique of small changes.  Pt will specifically work on: continue to follow the prescribed nutritioal meal plan and add at least 1 day of extra physical activity for next visit.    Recommended Physical Activity Goals  Kendra Day has been advised to slowly work up to 150 minutes of moderate intensity aerobic activity a week and strengthening exercises 2-3 times per week for  cardiovascular health, weight loss maintenance and preservation of muscle mass.   She has agreed to Continue current level of physical activity   FOLLOW UP: No follow-ups on file. She was informed of the importance of frequent follow up visits to maximize her success with intensive  lifestyle modifications for her multiple health conditions.  Subjective:   Chief complaint: Obesity Kendra Day is here to discuss her progress with her obesity treatment plan. She is on the the Category 2 Plan and states she is following her eating plan approximately 25% of the time. She states she is walking 30 minutes 4 days per week and weightlifting 20 minutes 2 days per wee.  Interval History:  Kendra Day is here for a follow up office visit.     Since last OV she has been well. She has recently joined [employer]. She tries to walk either in the early morning or late afternoon with her dogs. She did inform me that she ate out a couple of times this weekend and hasn't drank a adequate amount of water during that time either. She endorses wanting better core strength.    Pharmacotherapy for weight loss: She is currently taking Wellbutrin for medical weight loss.  Denies side effects.    Review of Systems:  Pertinent positives were addressed with patient today.   Reviewed by clinician on day of visit: allergies, medications, problem list, medical history, surgical history, family history, social history, and previous encounter notes.  Weight Summary and Biometrics   Weight Lost Since Last Visit: 0lb  Weight Gained Since Last Visit: 1lb   Vitals Temp: 98.5 F (36.9 C) BP: 129/83 Pulse Rate: 68 SpO2: 100 %   Anthropometric Measurements Height: 5\' 5"  (1.651 m) Weight: 192 lb (87.1 kg) BMI (Calculated): 31.95 Weight at Last Visit: 191lb Weight Lost Since Last Visit: 0lb Weight Gained Since Last Visit: 1lb Starting Weight: 239lb Total Weight Loss (lbs): 47 lb (21.3 kg) Peak Weight: 244lb   Body Composition  Body Fat %: 39.2 % Fat Mass (lbs): 75.4 lbs Muscle Mass (lbs): 110.8 lbs Total Body Water (lbs): 86.4 lbs Visceral Fat Rating : 10   Other Clinical Data Fasting: no Labs: no Today's Visit #: 37 Starting Date: 10/12/20     Objective:    PHYSICAL EXAM: Blood pressure 129/83, pulse 68, temperature 98.5 F (36.9 C), height 5\' 5"  (1.651 m), weight 192 lb (87.1 kg), SpO2 100%. Body mass index is 31.95 kg/m.  General: Well Developed, well nourished, and in no acute distress.  HEENT: Normocephalic, atraumatic Skin: Warm and dry, cap RF less 2 sec, good turgor Chest:  Normal excursion, shape, no gross abn Respiratory: speaking in full sentences, no conversational dyspnea NeuroM-Sk: Ambulates w/o assistance, moves * 4 Psych: A and O *3, insight good, mood-full  DIAGNOSTIC DATA REVIEWED:  BMET    Component Value Date/Time   NA 140 04/22/2023 0939   K 4.8 04/22/2023 0939   CL 105 04/22/2023 0939   CO2 24 04/22/2023 0939   GLUCOSE 101 (H) 04/22/2023 0939   GLUCOSE 104 (H) 02/21/2014 1124   BUN 24 04/22/2023 0939   CREATININE 1.04 (H) 04/22/2023 0939   CALCIUM 10.0 04/22/2023 0939   GFRNONAA 66 (L) 02/21/2014 1124   GFRAA 76 (L) 02/21/2014 1124   Lab Results  Component Value Date   HGBA1C 5.8 (H) 04/22/2023   HGBA1C 5.6 04/19/2021   Lab Results  Component Value Date   INSULIN 9.7 04/22/2023   INSULIN 10.8 10/12/2020  Lab Results  Component Value Date   TSH 0.617 04/22/2023   CBC    Component Value Date/Time   WBC 7.6 02/21/2014 1124   RBC 4.88 02/21/2014 1124   HGB 14.6 02/21/2014 1124   HCT 41.6 02/21/2014 1124   PLT 218 02/21/2014 1124   MCV 85.2 02/21/2014 1124   MCH 29.9 02/21/2014 1124   MCHC 35.1 02/21/2014 1124   RDW 12.6 02/21/2014 1124   Iron Studies No results found for: "IRON", "TIBC", "FERRITIN", "IRONPCTSAT" Lipid Panel     Component Value Date/Time   CHOL 201 (A) 01/11/2023 0000   CHOL 179 12/18/2021 0901   TRIG 107 01/11/2023 0000   HDL 68 12/18/2021 0901   CHOLHDL 2.6 12/18/2021 0901   LDLCALC 95 12/18/2021 0901   Hepatic Function Panel     Component Value Date/Time   PROT 6.9 04/22/2023 0939   ALBUMIN 4.4 04/22/2023 0939   AST 21 04/22/2023 0939   ALT 23 04/22/2023  0939   ALKPHOS 146 (H) 04/22/2023 0939   BILITOT 0.3 04/22/2023 0939      Component Value Date/Time   TSH 0.617 04/22/2023 0939   Nutritional Lab Results  Component Value Date   VD25OH 53.6 04/22/2023   VD25OH 49.5 12/13/2022   VD25OH 70.6 06/20/2022    Attestations:   I, Clinical biochemist , acting as a Stage manager for [employer], DO., have compiled all relevant documentation for today's office visit on behalf of Thomasene Lot, DO, while in the presence of [employer], DO.  I have reviewed the above documentation for accuracy and completeness, and I agree with the above. [name], D.O.  The 21st Century Cures Act was signed into law in 2016 which includes the topic of electronic health records.  This provides immediate access to information in MyChart.  This includes consultation notes, operative notes, office notes, lab results and pathology reports.  If you have any questions about what you read please let us know at your next visit so we can discuss your concerns and take corrective action if need be.  We are right here with you.

## 2023-07-05 ENCOUNTER — Ambulatory Visit: Payer: 59 | Admitting: Psychology

## 2023-07-05 DIAGNOSIS — F4321 Adjustment disorder with depressed mood: Secondary | ICD-10-CM

## 2023-07-05 NOTE — Progress Notes (Signed)
[facility] Counselor/Therapist Progress Note  Patient ID: EMILY MASSAR, MRN: [id number],    Date: 07/05/2023  Time Spent: 45 mins  start time: 1500   end time: 1545  Treatment Type: Individual Therapy  Reported Symptoms: Pt presented for follow up session, via Caregility video.  Pt grants consent for session, stating she is in her home with no one else present; pt states she understands the limits of virtual session.  I shared with pt that I am in my office with no one else present here either.    Mental Status Exam: Appearance:  Casual     Behavior: Appropriate  Motor: Normal  Speech/Language:  Clear and Coherent  Affect: Appropriate  Mood: normal  Thought process: normal  Thought content:   WNL  Sensory/Perceptual disturbances:   WNL  Orientation: oriented to person, place, and time/date  Attention: Good  Concentration: Good  Memory: WNL  Fund of knowledge:  Good  Insight:   Good  Judgment:  Good  Impulse Control: Good   Risk Assessment: Danger to Self:  No Self-injurious Behavior: No Danger to Others: No Duty to Warn:no Physical Aggression / Violence:No  Access to Firearms a concern: No  Gang Involvement:No   Subjective: Pt shares that "I have been pretty good since our last session.  It has been an interesting few weeks with [name]; he has been all over the place.  He continues to do his best to annoy everyone in the house.  Nothing we do is ever accurate or correct or enough; [name] is getting more annoyed than normal.  He thinks he has great advice for everyone, even though it is always delivered in a condescending way.  He continues to accuse me of not being nurturing or caring towards him and it no longer bothers me and I no longer engage with him when he is doing that."  Pt shares that he generally stops talking when she walks away.  Pt shares that he has a friend who goes to [employer] and he spent the weekend with him in [place] and helped him move some  furniture to the friend's new apt in [place].  He has mentioned after he got home about going to [employer].  He is supposed to start a new full time job this week, presuming he can pass the drug screen, with a [employer].  The father of his friend who goes to [employer] got him this job.  He seems very committed at this point although he has never worked a full time job ever.  Pt is hopeful everything will work out with this opportunity.  Pt shares that work is going well; "it is the end of my slow period."  Pt shares that her boss has told pt that they will try to move some of her work off of her at the beginning of the first of 2025 to free her up a bit more.  Pt had lunch with friends from work (old company) and Kevin's wife.  Pt shares that it has been 2 yrs since [name] has his significant difficulty; [name] is doing so much better than he was but he still has struggles in his life.  [name] is looking forward to going back to [employer] in 2 wks (8/18).  He is currently double majoring but he may want to finish in less than 4 yrs; he is still trying to decide his next steps.  Pt shares she continues to work on puzzles, she is  reading a physical book, and her coworker and pt have joined [employer] and they are going twice a week together, etc.  Pt shares that [name] is doing well with his work.  Encouraged pt to continue with her self care activities and we will meet in 3 wks for a follow up session.   Interventions: Cognitive Behavioral Therapy  Diagnosis:Adjustment disorder with depressed mood  Plan: Treatment Plan Strengths/Abilities:  Intelligent, Intuitive, Willing to participate in therapy Treatment Preferences:  Outpatient Individual Therapy Statement of Needs:  Patient is to use CBT, mindfulness and coping skills to help manage and/or decrease symptoms associated with their diagnosis. Symptoms:  Depressed/Irritable mood, worry, social withdrawal Problems Addressed:  Depressive thoughts,  Sadness, Sleep issues, etc. Long Term Goals:  Pt to reduce overall level, frequency, and intensity of the feelings of depression as evidenced by decreased irritability, negative self talk, and helpless feelings from 6 to 7 days/week to 0 to 1 days/week, per client report, for at least 3 consecutive months.  Progress: 30% Short Term Goals:  Pt to verbally express understanding of the relationship between feelings of depression and their impact on thinking patterns and behaviors.  Pt to verbalize an understanding of the role that distorted thinking plays in creating fears, excessive worry, and ruminations.  Progress: 30% Target Date:  11/24/2023 Frequency:  Monthly Modality:  Cognitive Behavioral Therapy Interventions by Therapist:  Therapist will use CBT, Mindfulness exercises, Coping skills and Referrals, as needed by client. Client has verbally approved this treatment plan.  Karie Kirks, [facility]

## 2023-07-23 ENCOUNTER — Ambulatory Visit (INDEPENDENT_AMBULATORY_CARE_PROVIDER_SITE_OTHER): Payer: 59 | Admitting: Family Medicine

## 2023-07-23 ENCOUNTER — Encounter (INDEPENDENT_AMBULATORY_CARE_PROVIDER_SITE_OTHER): Payer: Self-pay | Admitting: Family Medicine

## 2023-07-23 VITALS — BP 132/82 | HR 64 | Temp 97.7°F | Ht 65.0 in | Wt 192.0 lb

## 2023-07-23 DIAGNOSIS — F39 Unspecified mood [affective] disorder: Secondary | ICD-10-CM

## 2023-07-23 DIAGNOSIS — Z6831 Body mass index (BMI) 31.0-31.9, adult: Secondary | ICD-10-CM

## 2023-07-23 DIAGNOSIS — R7303 Prediabetes: Secondary | ICD-10-CM | POA: Diagnosis not present

## 2023-07-23 DIAGNOSIS — J302 Other seasonal allergic rhinitis: Secondary | ICD-10-CM

## 2023-07-23 DIAGNOSIS — Z6832 Body mass index (BMI) 32.0-32.9, adult: Secondary | ICD-10-CM

## 2023-07-23 NOTE — Progress Notes (Signed)
Kendra Day, D.O.  ABFM, ABOM Specializing in Clinical Bariatric Medicine  Office located at: [address] [place], [place]  [address]     Assessment and Plan:   Check BMP & A1c next OV  Mood disorder (HCC), with emotional eating Assessment & Plan: Denies any SI/HI. Mood is stable.Cravings and hunger are well controlled presently. No issues with Wellbutrin SR 150 mg daily, compliance good.   Continue with Wellbutrin at current dose - pt denies need for refill. Continue her prudent nutritional plan that is low in simple carbohydrates, saturated fats and trans fats to goal of 5-10% weight loss to achieve significant health benefits.     Pre-diabetes Assessment & Plan: No current meds. Diet/exercise approach. No complaints of hunger and cravings today.   Lab Results  Component Value Date   HGBA1C 5.8 (H) 04/22/2023   HGBA1C 5.8 (H) 12/13/2022   HGBA1C 5.8 (H) 06/20/2022   INSULIN 9.7 04/22/2023   INSULIN 9.2 12/13/2022   INSULIN 8.3 12/18/2021    Continue to decrease simple carbs/ sugars; increase fiber and proteins -> follow her meal plan. A1c and CMP; next OV.      Seasonal allergies Assessment & Plan: Symptoms are stable with Allegra 180 mg daily and Singulair 10 mg daily. No concerns.  Continue with current treatment plan and low-inflammatory meal plan. Will continue to monitor as deemed clinically necessary.    TREATMENT PLAN FOR OBESITY: BMI 32.0-32.9,adult-current bmi 31.95 Morbid obesity (HCC)-start bmi 39.77/date 10/12/20 Assessment & Plan: Kendra Day is here to discuss her progress with her obesity treatment plan along with follow-up of her obesity related diagnoses. See Medical Weight Management Flowsheet for complete bioelectrical impedance results.  Since last office visit on 07/01/23 patient's  Muscle mass has increased by 3.4 lb. Fat mass has decreased by 2.8 lb. Total body water has decreased by 1.8 lb.  Counseling done on how  various foods will affect these numbers and how to maximize success  Total lbs lost to date: 47 lbs  Total weight loss percentage to date: 19.67%  No change to meal plan - see Subjective   Behavioral Intervention Additional resources provided today: patient declined Evidence-based interventions for health behavior change were utilized today including the discussion of self monitoring techniques, problem-solving barriers and SMART goal setting techniques.   Regarding patient's less desirable eating habits and patterns, we employed the technique of small changes.  Pt will specifically work on: increasing water intake to 116 ounces per day or more for next visit.    FOLLOW UP: Return in about 3 weeks (around 08/13/2023). She was informed of the importance of frequent follow up visits to maximize her success with intensive lifestyle modifications for her multiple health conditions.  Subjective:   Chief complaint: Obesity [name] is here to discuss her progress with her obesity treatment plan. She is on the Category 2 Plan with the option of journaling 1300-1400 calories and 90+ grams of protein daily and states she is following her eating plan approximately 25% of the time. She states she is walking 35 minutes, 4-5 days per week and doing weights 25 minutes, 2 days per week.   Interval History:  Kendra Day is here for a follow up office visit. Since last OV, Kyle has been doing well. Reports that work has been busy. Endorses eating more off plan foods lately with her son, such as ice cream and nachos. Yesterday, she started following her meal plan closely again. Has been  consistently exercising for the past 4-5 wks. Drinks at least 100 ounces of water daily.  Pharmacotherapy for weight loss: She is currently taking  Wellbutrin SR 150 mg daily  for weight loss.  Denies side effects.    Review of Systems:  Pertinent positives were addressed with patient today.  Reviewed by  clinician on day of visit: allergies, medications, problem list, medical history, surgical history, family history, social history, and previous encounter notes.  Weight Summary and Biometrics   Weight Lost Since Last Visit: 0lb  Weight Gained Since Last Visit: 0lb   Vitals Temp: 97.7 F (36.5 C) BP: 132/82 Pulse Rate: 64 SpO2: 100 %   Anthropometric Measurements Height: 5\' 5"  (1.651 m) Weight: 192 lb (87.1 kg) BMI (Calculated): 31.95 Weight at Last Visit: 192lb Weight Lost Since Last Visit: 0lb Weight Gained Since Last Visit: 0lb Starting Weight: 239lb Total Weight Loss (lbs): 47 lb (21.3 kg) Peak Weight: 244lb   Body Composition  Body Fat %: 37.7 % Fat Mass (lbs): 72.6 lbs Muscle Mass (lbs): 114.2 lbs Total Body Water (lbs): 84.6 lbs Visceral Fat Rating : 9   Other Clinical Data Fasting: no Labs: no Today's Visit #: 38 Starting Date: 10/12/20   Objective:   PHYSICAL EXAM: Blood pressure 132/82, pulse 64, temperature 97.7 F (36.5 C), height 5\' 5"  (1.651 m), weight 192 lb (87.1 kg), SpO2 100%. Body mass index is 31.95 kg/m.  General: Well Developed, well nourished, and in no acute distress.  HEENT: Normocephalic, atraumatic Skin: Warm and dry, cap RF less 2 sec, good turgor Chest:  Normal excursion, shape, no gross abn Respiratory: speaking in full sentences, no conversational dyspnea NeuroM-Sk: Ambulates w/o assistance, moves * 4 Psych: A and O *3, insight good, mood-full  DIAGNOSTIC DATA REVIEWED:  BMET    Component Value Date/Time   NA 140 04/22/2023 0939   K 4.8 04/22/2023 0939   CL 105 04/22/2023 0939   CO2 24 04/22/2023 0939   GLUCOSE 101 (H) 04/22/2023 0939   GLUCOSE 104 (H) 02/21/2014 1124   BUN 24 04/22/2023 0939   CREATININE 1.04 (H) 04/22/2023 0939   CALCIUM 10.0 04/22/2023 0939   GFRNONAA 66 (L) 02/21/2014 1124   GFRAA 76 (L) 02/21/2014 1124   Lab Results  Component Value Date   HGBA1C 5.8 (H) 04/22/2023   HGBA1C 5.6  04/19/2021   Lab Results  Component Value Date   INSULIN 9.7 04/22/2023   INSULIN 10.8 10/12/2020   Lab Results  Component Value Date   TSH 0.617 04/22/2023   CBC    Component Value Date/Time   WBC 7.6 02/21/2014 1124   RBC 4.88 02/21/2014 1124   HGB 14.6 02/21/2014 1124   HCT 41.6 02/21/2014 1124   PLT 218 02/21/2014 1124   MCV 85.2 02/21/2014 1124   MCH 29.9 02/21/2014 1124   MCHC 35.1 02/21/2014 1124   RDW 12.6 02/21/2014 1124   Iron Studies No results found for: "IRON", "TIBC", "FERRITIN", "IRONPCTSAT" Lipid Panel     Component Value Date/Time   CHOL 201 (A) 01/11/2023 0000   CHOL 179 12/18/2021 0901   TRIG 107 01/11/2023 0000   HDL 68 12/18/2021 0901   CHOLHDL 2.6 12/18/2021 0901   LDLCALC 95 12/18/2021 0901   Hepatic Function Panel     Component Value Date/Time   PROT 6.9 04/22/2023 0939   ALBUMIN 4.4 04/22/2023 0939   AST 21 04/22/2023 0939   ALT 23 04/22/2023 0939   ALKPHOS 146 (H) 04/22/2023 4540  BILITOT 0.3 04/22/2023 0939      Component Value Date/Time   TSH 0.617 04/22/2023 0939   Nutritional Lab Results  Component Value Date   VD25OH 53.6 04/22/2023   VD25OH 49.5 12/13/2022   VD25OH 70.6 06/20/2022    Attestations:   I, Special Puri, acting as a Stage manager for [employer], DO., have compiled all relevant documentation for today's office visit on behalf of Thomasene Lot, DO, while in the presence of [employer], DO.  I have reviewed the above documentation for accuracy and completeness, and I agree with the above. Kendra Day, D.O.  The 21st Century Cures Act was signed into law in 2016 which includes the topic of electronic health records.  This provides immediate access to information in MyChart.  This includes consultation notes, operative notes, office notes, lab results and pathology reports.  If you have any questions about what you read please let us know at your next visit so we can discuss your concerns and  take corrective action if need be.  We are right here with you.

## 2023-08-02 ENCOUNTER — Other Ambulatory Visit: Payer: Self-pay | Admitting: Family Medicine

## 2023-08-02 ENCOUNTER — Ambulatory Visit (INDEPENDENT_AMBULATORY_CARE_PROVIDER_SITE_OTHER): Payer: 59 | Admitting: Psychology

## 2023-08-02 DIAGNOSIS — F4321 Adjustment disorder with depressed mood: Secondary | ICD-10-CM

## 2023-08-02 DIAGNOSIS — R253 Fasciculation: Secondary | ICD-10-CM

## 2023-08-02 DIAGNOSIS — R937 Abnormal findings on diagnostic imaging of other parts of musculoskeletal system: Secondary | ICD-10-CM

## 2023-08-02 DIAGNOSIS — R208 Other disturbances of skin sensation: Secondary | ICD-10-CM

## 2023-08-02 NOTE — Progress Notes (Signed)
Kendra Day Behavioral Health Counselor/Therapist Progress Note  Patient ID: Kendra Day, MRN: [id number],    Date: 08/02/2023  Time Spent: 45 mins  start time: 1500   end time: 1545  Treatment Type: Individual Therapy  Reported Symptoms: Pt presented for follow up session, via Caregility video.  Pt grants consent for session, stating she is in her home with no one else present; pt states she understands the limits of virtual session.  I shared with pt that I am in my office with no one else present here either.    Mental Status Exam: Appearance:  Casual     Behavior: Appropriate  Motor: Normal  Speech/Language:  Clear and Coherent  Affect: Appropriate  Mood: normal  Thought process: normal  Thought content:   WNL  Sensory/Perceptual disturbances:   WNL  Orientation: oriented to person, place, and time/date  Attention: Good  Concentration: Good  Memory: WNL  Fund of knowledge:  Good  Insight:   Good  Judgment:  Good  Impulse Control: Good   Risk Assessment: Danger to Self:  No Self-injurious Behavior: No Danger to Others: No Duty to Warn:no Physical Aggression / Violence:No  Access to Firearms a concern: No  Gang Involvement:No   Subjective: Pt shares that "I have been pretty good since our last session.  Things with Kendra Day tend to move along OK and then something happens that ramps him up even more.  Kendra Day and I have tried to figure our what prompts those outbursts but we can't see anything that fits.  We have told him that he needs to decide where he wants to land (i.e., [place], [place], etc.).  He is in [place] with friends this weekend.  Kendra Day told Kendra Day this past week that he thinks he will just stay in the home and Kendra Day told him that they will be moving him out.  They still cannot count on Kendra Day to plan more than right in front of his face.  They are still planning to pay his rent in an apt somewhere that he chooses to be.  Pt shares that the [employer] job did not  work out because Kendra Day did not have the skills required to do the job.  Kendra Day continues to apply for jobs but has not found anything yet.  Pt shares that she continues to go to the gym regularly, she has been working on puzzles, walking the dogs, and has been busy at work, Catering manager.  They are celebrating their 25th anniversary in Oct; they are going to [place] for a music festival in 2 wks and they are both looking forward to that.  Encouraged pt to continue with her self care activities and we will meet in 3 wks for a follow up session.   Interventions: Cognitive Behavioral Therapy  Diagnosis:Adjustment disorder with depressed mood  Plan: Treatment Plan Strengths/Abilities:  Intelligent, Intuitive, Willing to participate in therapy Treatment Preferences:  Outpatient Individual Therapy Statement of Needs:  Patient is to use CBT, mindfulness and coping skills to help manage and/or decrease symptoms associated with their diagnosis. Symptoms:  Depressed/Irritable mood, worry, social withdrawal Problems Addressed:  Depressive thoughts, Sadness, Sleep issues, etc. Long Term Goals:  Pt to reduce overall level, frequency, and intensity of the feelings of depression as evidenced by decreased irritability, negative self talk, and helpless feelings from 6 to 7 days/week to 0 to 1 days/week, per client report, for at least 3 consecutive months.  Progress: 30% Short Term Goals:  Pt to verbally express understanding  of the relationship between feelings of depression and their impact on thinking patterns and behaviors.  Pt to verbalize an understanding of the role that distorted thinking plays in creating fears, excessive worry, and ruminations.  Progress: 30% Target Date:  11/24/2023 Frequency:  Monthly Modality:  Cognitive Behavioral Therapy Interventions by Therapist:  Therapist will use CBT, Mindfulness exercises, Coping skills and Referrals, as needed by client. Client has verbally approved this treatment  plan.  Karie Kirks, [facility]

## 2023-08-06 ENCOUNTER — Encounter: Payer: Self-pay | Admitting: Neurology

## 2023-08-06 ENCOUNTER — Other Ambulatory Visit: Payer: Self-pay | Admitting: Obstetrics and Gynecology

## 2023-08-06 DIAGNOSIS — Z1231 Encounter for screening mammogram for malignant neoplasm of breast: Secondary | ICD-10-CM

## 2023-08-11 ENCOUNTER — Other Ambulatory Visit (INDEPENDENT_AMBULATORY_CARE_PROVIDER_SITE_OTHER): Payer: Self-pay | Admitting: Family Medicine

## 2023-08-11 DIAGNOSIS — F39 Unspecified mood [affective] disorder: Secondary | ICD-10-CM

## 2023-08-13 ENCOUNTER — Ambulatory Visit (INDEPENDENT_AMBULATORY_CARE_PROVIDER_SITE_OTHER): Payer: 59 | Admitting: Family Medicine

## 2023-08-13 ENCOUNTER — Other Ambulatory Visit (INDEPENDENT_AMBULATORY_CARE_PROVIDER_SITE_OTHER): Payer: Self-pay

## 2023-08-13 ENCOUNTER — Encounter (INDEPENDENT_AMBULATORY_CARE_PROVIDER_SITE_OTHER): Payer: Self-pay | Admitting: Family Medicine

## 2023-08-13 DIAGNOSIS — F39 Unspecified mood [affective] disorder: Secondary | ICD-10-CM

## 2023-08-13 MED ORDER — BUPROPION HCL ER (SR) 150 MG PO TB12
150.0000 mg | ORAL_TABLET | Freq: Every day | ORAL | 0 refills | Status: AC
Start: 2023-08-13 — End: ?

## 2023-08-13 NOTE — Telephone Encounter (Signed)
Ok to rf x 1 month

## 2023-08-30 ENCOUNTER — Ambulatory Visit (INDEPENDENT_AMBULATORY_CARE_PROVIDER_SITE_OTHER): Payer: 59 | Admitting: Psychology

## 2023-08-30 DIAGNOSIS — F4321 Adjustment disorder with depressed mood: Secondary | ICD-10-CM | POA: Diagnosis not present

## 2023-08-30 NOTE — Progress Notes (Signed)
Kendra Day Behavioral Health Counselor/Therapist Progress Note  Patient ID: [name], MRN: [id number],    Date: 08/30/2023  Time Spent: 45 mins  start time: 1500   end time: 1545  Treatment Type: Individual Therapy  Reported Symptoms: Pt presented for follow up session, via Caregility video.  Pt grants consent for session, stating she is in her home with no one else present; pt states she understands the limits of virtual session.  I shared with pt that I am in my office with no one else present here either.    Mental Status Exam: Appearance:  Casual     Behavior: Appropriate  Motor: Normal  Speech/Language:  Clear and Coherent  Affect: Appropriate  Mood: normal  Thought process: normal  Thought content:   WNL  Sensory/Perceptual disturbances:   WNL  Orientation: oriented to person, place, and time/date  Attention: Good  Concentration: Good  Memory: WNL  Fund of knowledge:  Good  Insight:   Good  Judgment:  Good  Impulse Control: Good   Risk Assessment: Danger to Self:  No Self-injurious Behavior: No Danger to Others: No Duty to Warn:no Physical Aggression / Violence:No  Access to Firearms a concern: No  Gang Involvement:No   Subjective: Pt shares that "I have been pretty good since our last session.  Shortly after our last session, [name] had another outburst where he started berating both [name] and I for not being better parents."  Pt shares that [name] has been really misogynistic and antisemitic lately and this is troublesome for pt.  Pt is also worried about [name]'s inability to honor authority and how that may make it hard for him to get and keep jobs.  [name] is in [place] with friends right now; he says he still wants to move to [place].  Pt shares that they do not know any of his friends in [place] nor do they know any of his friends in [place].  Pt tries not to put [name] in the middle of [name]'s behavior to pt.  [name] is more of [name]'s contact in the home.  Pt and [name]  are attempting to steer [name] into a job rather than encouraging him to go back to school.  Pt shares that she and [name] enjoyed their trip to [place], [place] to celebrate their 25th wedding anniversary; they have been working on puzzles, listening to audio books, walking the dogs, etc.  Pt shares that she has been sleeping pretty well as well.  Encouraged pt to continue with her self care activities and we will meet in 3 wks for a follow up session.   Interventions: Cognitive Behavioral Therapy  Diagnosis:No diagnosis found.  Plan: Treatment Plan Strengths/Abilities:  Intelligent, Intuitive, Willing to participate in therapy Treatment Preferences:  Outpatient Individual Therapy Statement of Needs:  Patient is to use CBT, mindfulness and coping skills to help manage and/or decrease symptoms associated with their diagnosis. Symptoms:  Depressed/Irritable mood, worry, social withdrawal Problems Addressed:  Depressive thoughts, Sadness, Sleep issues, etc. Long Term Goals:  Pt to reduce overall level, frequency, and intensity of the feelings of depression as evidenced by decreased irritability, negative self talk, and helpless feelings from 6 to 7 days/week to 0 to 1 days/week, per client report, for at least 3 consecutive months.  Progress: 30% Short Term Goals:  Pt to verbally express understanding of the relationship between feelings of depression and their impact on thinking patterns and behaviors.  Pt to verbalize an understanding of the role that distorted thinking  plays in creating fears, excessive worry, and ruminations.  Progress: 30% Target Date:  11/24/2023 Frequency:  Monthly Modality:  Cognitive Behavioral Therapy Interventions by Therapist:  Therapist will use CBT, Mindfulness exercises, Coping skills and Referrals, as needed by client. Client has verbally approved this treatment plan.  Karie Kirks, [facility]

## 2023-09-03 ENCOUNTER — Ambulatory Visit (INDEPENDENT_AMBULATORY_CARE_PROVIDER_SITE_OTHER): Payer: 59 | Admitting: Family Medicine

## 2023-09-04 ENCOUNTER — Encounter (INDEPENDENT_AMBULATORY_CARE_PROVIDER_SITE_OTHER): Payer: Self-pay | Admitting: Family Medicine

## 2023-09-04 ENCOUNTER — Ambulatory Visit (INDEPENDENT_AMBULATORY_CARE_PROVIDER_SITE_OTHER): Payer: 59 | Admitting: Family Medicine

## 2023-09-04 VITALS — BP 133/84 | HR 65 | Temp 97.7°F | Ht 65.0 in | Wt 193.0 lb

## 2023-09-04 DIAGNOSIS — R7303 Prediabetes: Secondary | ICD-10-CM

## 2023-09-04 DIAGNOSIS — J302 Other seasonal allergic rhinitis: Secondary | ICD-10-CM

## 2023-09-04 DIAGNOSIS — F39 Unspecified mood [affective] disorder: Secondary | ICD-10-CM

## 2023-09-04 DIAGNOSIS — Z6832 Body mass index (BMI) 32.0-32.9, adult: Secondary | ICD-10-CM

## 2023-09-04 DIAGNOSIS — Z6831 Body mass index (BMI) 31.0-31.9, adult: Secondary | ICD-10-CM

## 2023-09-04 MED ORDER — BUPROPION HCL ER (SR) 150 MG PO TB12
ORAL_TABLET | ORAL | 0 refills | Status: DC
Start: 2023-09-04 — End: 2023-10-01

## 2023-09-04 NOTE — Progress Notes (Signed)
Kendra Day, D.O.  ABFM, ABOM Specializing in Clinical Bariatric Medicine  Office located at: [address] [place], [place]  [address]     Assessment and Plan:  Draw non-fasting labs next OV Medications Discontinued During This Encounter  Medication Reason   buPROPion (WELLBUTRIN SR) 150 MG 12 hr tablet Reorder    Meds ordered this encounter  Medications   buPROPion (WELLBUTRIN SR) 150 MG 12 hr tablet    Sig: Twice daily    Dispense:  60 tablet    Refill:  0    Seasonal allergies Assessment: Condition is Controlled.. This is being treated with Singulair and Allegra and she tolerates these well.   Plan: - Continue Singulair 10mg  daily and Allegra 180mg  as directed.   - She denies need for refill.    Mood disorder (HCC), with emotional eating Assessment: Condition is Improving, but not optimized.. Denies any SI/HI. Mood is stable. Cravings and hunger are not well controlled. She feel as her stress level can contribute to this. This is being treated with Wellbutrin daily. She tolerates this well and feels as this was working better initially. Pt denies any adverse side effects.   Plan: - Begin taking Wellbutrin SR 150mg  BID.   - Reminded patient of the importance of following their prudent nutrition plan and how food can affect mood as well to support emotional wellbeing.   - Behavior modification techniques were discussed today to help deal with emotional/ non-hunger eating behaviors.    Pre-diabetes Assessment: Condition is Not at goal.. This is diet/exercise controlled. She endorses having hunger when not following the meal plan. She feels as sugar and carbs are increasing her hunger.  Lab Results  Component Value Date   HGBA1C 5.8 (H) 04/22/2023   HGBA1C 5.8 (H) 12/13/2022   HGBA1C 5.8 (H) 06/20/2022   INSULIN 9.7 04/22/2023   INSULIN 9.2 12/13/2022   INSULIN 8.3 12/18/2021    Plan: - Kendra Day will continue to work on weight loss, exercise, via their  meal plan we devised to help decrease the risk of progressing to diabetes.    - I explained role of simple carbs and insulin levels on hunger and cravings  - Anticipatory guidance given.    - We will recheck A1c and fasting insulin level in approximately 3 months from last check, or as deemed appropriate.    TREATMENT PLAN FOR OBESITY: BMI 32.0-32.9,adult-current bmi 31.95 Morbid obesity (HCC)-start bmi 39.77/date 10/12/20 Assessment:  Kendra Day is here to discuss her progress with her obesity treatment plan along with follow-up of her obesity related diagnoses. See Medical Weight Management Flowsheet for complete bioelectrical impedance results.  Condition is not optimized. Biometric data collected today, was reviewed with patient.   Since last office visit on 07/23/2023 patient's  Muscle mass has decreased by 6.8lb. Fat mass has increased by 7.2lb. Total body water has decreased by 6.2lb.  Counseling done on how various foods will affect these numbers and how to maximize success  Total lbs lost to date: 46 Total weight loss percentage to date: 19.25   Plan: - Kendra Day will work on healthier eating habits and try to follow the the Category 2 Plan and keeping a food journal and adhering to recommended goals of 1300-1400 calories and 90+ protein as best they can.   Behavioral Intervention Additional resources provided today: patient declined Evidence-based interventions for health behavior change were utilized today including the discussion of self monitoring techniques, problem-solving barriers and SMART goal setting techniques.  Regarding patient's less desirable eating habits and patterns, we employed the technique of small changes.  Pt will specifically work on: Restart following the nutritional plan for next visit.     She has agreed to Continue current level of physical activity   FOLLOW UP: Return in about 4 weeks (around 10/02/2023).  She was informed of the  importance of frequent follow up visits to maximize her success with intensive lifestyle modifications for her multiple health conditions.  Subjective:   Chief complaint: Obesity Kendra Day is here to discuss her progress with her obesity treatment plan. She is on the Category 2 Plan and keeping a food journal and adhering to recommended goals of 1300-1400 calories and 90+ protein and states she is following her eating plan approximately 50% of the time. She states she is walking 30 minutes 4-5 days per week and is using weights 20-25 minutes 2 days per week.  Interval History: Kendra Day is here for a follow up office visit.     Since last office visit:  She notes that her weight loss journey has been slightly stressful and off due to her job as this is her busy season. She recently returned from being gone on vacation for 4 days and ate off plan. She notes her clothes not fitting her differently yet.     We reviewed her meal plan and all questions were answered.   Pharmacotherapy for weight loss: She is currently taking Wellbutrin for medical weight loss.  Denies side effects.    Review of Systems:  Pertinent positives were addressed with patient today.  Reviewed by clinician on day of visit: allergies, medications, problem list, medical history, surgical history, family history, social history, and previous encounter notes.  Weight Summary and Biometrics   Weight Lost Since Last Visit: 0lb  Weight Gained Since Last Visit: 1lb   Vitals Temp: 97.7 F (36.5 C) BP: 133/84 Pulse Rate: 65 SpO2: 100 %   Anthropometric Measurements Height: 5\' 5"  (1.651 m) Weight: 193 lb (87.5 kg) BMI (Calculated): 32.12 Weight at Last Visit: 192lb Weight Lost Since Last Visit: 0lb Weight Gained Since Last Visit: 1lb Starting Weight: 239lb Total Weight Loss (lbs): 46 lb (20.9 kg) Peak Weight: 244lb   Body Composition  Body Fat %: 41.3 % Fat Mass (lbs): 79.8 lbs Muscle Mass (lbs):  107.4 lbs Total Body Water (lbs): 78.4 lbs Visceral Fat Rating : 10   Other Clinical Data Fasting: no Labs: no Today's Visit #: 39 Starting Date: 10/12/20     Objective:   PHYSICAL EXAM: Blood pressure 133/84, pulse 65, temperature 97.7 F (36.5 C), height 5\' 5"  (1.651 m), weight 193 lb (87.5 kg), SpO2 100%. Body mass index is 32.12 kg/m.  General: Well Developed, well nourished, and in no acute distress.  HEENT: Normocephalic, atraumatic Skin: Warm and dry, cap RF less 2 sec, good turgor Chest:  Normal excursion, shape, no gross abn Respiratory: speaking in full sentences, no conversational dyspnea NeuroM-Sk: Ambulates w/o assistance, moves * 4 Psych: A and O *3, insight good, mood-full  DIAGNOSTIC DATA REVIEWED:  BMET    Component Value Date/Time   NA 140 04/22/2023 0939   K 4.8 04/22/2023 0939   CL 105 04/22/2023 0939   CO2 24 04/22/2023 0939   GLUCOSE 101 (H) 04/22/2023 0939   GLUCOSE 104 (H) 02/21/2014 1124   BUN 24 04/22/2023 0939   CREATININE 1.04 (H) 04/22/2023 0939   CALCIUM 10.0 04/22/2023 0939   GFRNONAA 66 (L) 02/21/2014 1124  GFRAA 76 (L) 02/21/2014 1124   Lab Results  Component Value Date   HGBA1C 5.8 (H) 04/22/2023   HGBA1C 5.6 04/19/2021   Lab Results  Component Value Date   INSULIN 9.7 04/22/2023   INSULIN 10.8 10/12/2020   Lab Results  Component Value Date   TSH 0.617 04/22/2023   CBC    Component Value Date/Time   WBC 7.6 02/21/2014 1124   RBC 4.88 02/21/2014 1124   HGB 14.6 02/21/2014 1124   HCT 41.6 02/21/2014 1124   PLT 218 02/21/2014 1124   MCV 85.2 02/21/2014 1124   MCH 29.9 02/21/2014 1124   MCHC 35.1 02/21/2014 1124   RDW 12.6 02/21/2014 1124   Iron Studies No results found for: "IRON", "TIBC", "FERRITIN", "IRONPCTSAT" Lipid Panel     Component Value Date/Time   CHOL 201 (A) 01/11/2023 0000   CHOL 179 12/18/2021 0901   TRIG 107 01/11/2023 0000   HDL 68 12/18/2021 0901   CHOLHDL 2.6 12/18/2021 0901   LDLCALC  95 12/18/2021 0901   Hepatic Function Panel     Component Value Date/Time   PROT 6.9 04/22/2023 0939   ALBUMIN 4.4 04/22/2023 0939   AST 21 04/22/2023 0939   ALT 23 04/22/2023 0939   ALKPHOS 146 (H) 04/22/2023 0939   BILITOT 0.3 04/22/2023 0939      Component Value Date/Time   TSH 0.617 04/22/2023 0939   Nutritional Lab Results  Component Value Date   VD25OH 53.6 04/22/2023   VD25OH 49.5 12/13/2022   VD25OH 70.6 06/20/2022    Attestations:   I, Clinical biochemist, acting as a Stage manager for [employer], DO., have compiled all relevant documentation for today's office visit on behalf of Thomasene Lot, DO, while in the presence of [employer], DO.  I have reviewed the above documentation for accuracy and completeness, and I agree with the above. Kendra Day, D.O.  The 21st Century Cures Act was signed into law in 2016 which includes the topic of electronic health records.  This provides immediate access to information in MyChart.  This includes consultation notes, operative notes, office notes, lab results and pathology reports.  If you have any questions about what you read please let us know at your next visit so we can discuss your concerns and take corrective action if need be.  We are right here with you.

## 2023-09-05 ENCOUNTER — Ambulatory Visit
Admission: RE | Admit: 2023-09-05 | Discharge: 2023-09-05 | Disposition: A | Discharge status: Home / Self Care | Payer: 59 | Source: Ambulatory Visit

## 2023-09-05 ENCOUNTER — Encounter (HOSPITAL_BASED_OUTPATIENT_CLINIC_OR_DEPARTMENT_OTHER): Payer: Self-pay | Admitting: Radiology

## 2023-09-05 DIAGNOSIS — Z1231 Encounter for screening mammogram for malignant neoplasm of breast: Secondary | ICD-10-CM

## 2023-09-09 ENCOUNTER — Other Ambulatory Visit (INDEPENDENT_AMBULATORY_CARE_PROVIDER_SITE_OTHER): Payer: Self-pay | Admitting: Family Medicine

## 2023-09-09 ENCOUNTER — Ambulatory Visit: Payer: Self-pay | Admitting: Neurology

## 2023-09-09 DIAGNOSIS — F39 Unspecified mood [affective] disorder: Secondary | ICD-10-CM

## 2023-09-14 ENCOUNTER — Ambulatory Visit
Admission: RE | Admit: 2023-09-14 | Discharge: 2023-09-14 | Disposition: A | Discharge status: Home / Self Care | Payer: Self-pay | Source: Ambulatory Visit | Attending: Family Medicine | Admitting: Family Medicine

## 2023-09-14 ENCOUNTER — Ambulatory Visit
Admission: RE | Admit: 2023-09-14 | Discharge: 2023-09-14 | Disposition: A | Discharge status: Home / Self Care | Payer: 59 | Source: Ambulatory Visit | Attending: Family Medicine | Admitting: Family Medicine

## 2023-09-14 DIAGNOSIS — R208 Other disturbances of skin sensation: Secondary | ICD-10-CM

## 2023-09-14 DIAGNOSIS — R937 Abnormal findings on diagnostic imaging of other parts of musculoskeletal system: Secondary | ICD-10-CM

## 2023-09-14 DIAGNOSIS — R253 Fasciculation: Secondary | ICD-10-CM

## 2023-09-14 MED ORDER — GADOPICLENOL 0.5 MMOL/ML IV SOLN
9.0000 mL | Freq: Once | INTRAVENOUS | Status: AC | PRN
  Administered 2023-09-14: 9 mL via INTRAVENOUS

## 2023-09-18 ENCOUNTER — Ambulatory Visit: Payer: 59 | Admitting: Neurology

## 2023-09-18 ENCOUNTER — Encounter: Payer: Self-pay | Admitting: Neurology

## 2023-09-18 ENCOUNTER — Other Ambulatory Visit: Payer: 59

## 2023-09-18 VITALS — BP 139/88 | HR 70 | Ht 65.0 in | Wt 197.0 lb

## 2023-09-18 DIAGNOSIS — R253 Fasciculation: Secondary | ICD-10-CM

## 2023-09-18 DIAGNOSIS — R202 Paresthesia of skin: Secondary | ICD-10-CM

## 2023-09-18 LAB — B12 AND FOLATE PANEL
Folate: >24.2 ng/mL (ref >5.9)
Vitamin B-12: >1501 pg/mL — ABNORMAL HIGH (ref 211–911)

## 2023-09-18 NOTE — Patient Instructions (Signed)
Check labs  If your symptoms get worse, please come back and see me

## 2023-09-18 NOTE — Progress Notes (Signed)
[facility] Neurology Division Clinic Note - Initial Visit   Date: 09/18/2023   Kendra Day MRN: [id number] DOB: Jul 27, 1971   Dear Jorge Ny, FNP:  Thank you for your kind referral of Kendra Day for consultation of muscle twitches. Although her history is well known to you, please allow [name] to reiterate it for the purpose of our medical record. The patient was accompanied to the clinic by self.    Kendra Day is a 52 y.o. left-handed female with adjustment disorder, GERD, and seasonal allergies presenting for evaluation of muscle twitches.   IMPRESSION/PLAN: Muscle twitches of the right eye and left face, episodic.  No associated weakness.  MRI brain was personally viewed and does not show any worrisome findings.  - Reassurance provided  - Check vitamin B12, folate, copper  - Continue to monitor  2.  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  No evidence of demyelinating disease and prior CSF testing has been unrevealing.  This lesion would not cause facial twitches.    Return to clinic as needed  ------------------------------------------------------------- History of present illness: Starting in summer of 2024, she began having muscle twtiches around the right eye, left cheek, and left upper lip.  It occurs daily.  No associated pain or facial weakness.  No exacerbating or alleviating factors.   She has limited her caffeine and has not noticed that it is worse with stress.   She was seen by me in 2018 for cervical cord hyperintensity at C2. She was having numbness of the hands and electrical shock-like sensation which started in 2015.  Prior testing showed normal MRI brain, nonenhancing T2 hyperintensity at C2, and normal CSF results.  Repeat imaging in 2018 was stable.  She continues to have bilateral arm paresthesias, which is unchanged. No new weakness or numbness/tingling.   Nonsmoker.  She drinks  alcohol 1-2 times per week.  She works as Glass blower/designer for [employer]  She lives at home with husband and adult son and one child in college.   Out-side paper records, electronic medical record, and images have been reviewed where available and summarized as:  Lab Results  Component Value Date   HGBA1C 5.8 (H) 04/22/2023   Lab Results  Component Value Date   VITAMINB12 546 02/08/2014   Lab Results  Component Value Date   TSH 0.617 04/22/2023   Lab Results  Component Value Date   ESRSEDRATE 8 02/08/2014    Past Medical History:  Diagnosis Date   Adult acne    Anemia    Arthritis of carpometacarpal (CMC) joint of thumb    Edema of both lower extremities    Hydronephrosis    Infertility, female    Joint pain    Kidney problem    Knee pain    Numbness in both hands    Prediabetes    Reflux     Past Surgical History:  Procedure Laterality Date   CESAREAN SECTION     renal reflux       Medications:  Outpatient Encounter Medications as of 09/18/2023  Medication Sig   buPROPion (WELLBUTRIN SR) 150 MG 12 hr tablet Twice daily (Patient taking differently: 150 mg 2 (two) times daily. One tablet twice a day)   fexofenadine (ALLEGRA) 180 MG tablet Take 180 mg by mouth daily.   montelukast (SINGULAIR) 10 MG tablet Take 10 mg by mouth at bedtime.   omeprazole (PRILOSEC) 20 MG capsule Take 20 mg  by mouth daily.   progesterone (PROMETRIUM) 200 MG capsule Take 200 mg by mouth daily.   No facility-administered encounter medications on file as of 09/18/2023.    Allergies:  Allergies  Allergen Reactions   Codeine    Keflex [Cephalexin]    Penicillins Hives   Sulfa Antibiotics Hives    Family History: Family History  Problem Relation Age of Onset   Sjogren's syndrome Mother    Rheum arthritis Mother    Hyperlipidemia Mother    High blood pressure Mother    Thyroid disease Mother    Obesity Mother    Heart failure Father    Cancer Father    High  Cholesterol Father    Heart disease Father    Breast cancer Neg Hx     Social History: Social History   Tobacco Use   Smoking status: Never   Smokeless tobacco: Never  Substance Use Topics   Alcohol use: Yes    Alcohol/week: 1.0 standard drink of alcohol    Types: 1 Cans of beer per week    Comment: 1-2 times per week.   Drug use: No   Social History   Social History Narrative   Patient lives at home with her husband  [name]) and has has a Naval architect.   Patient works for [employer].   Left handed   Caffeine 2-3 cups daily.   Lives in a 2 story home.    Has two sons. One in college and still at home.    Works as an Scientist, water quality           Vital Signs:  BP 139/88   Pulse 70   Ht 5\' 5"  (1.651 m)   Wt 197 lb (89.4 kg)   SpO2 95%   BMI 32.78 kg/m   Neurological Exam: MENTAL STATUS including orientation to time, place, person, recent and remote memory, attention span and concentration, language, and fund of knowledge is normal.  Speech is not dysarthric.  CRANIAL NERVES: II:  No visual field defects.     III-IV-VI: Pupils equal round and reactive to light.  Normal conjugate, extra-ocular eye movements in all directions of gaze.  No nystagmus.  No ptosis.   V:  Normal facial sensation.    VII:  Normal facial symmetry.  Episodic fasciculation of the right lower lateral lid, resolved with eye closure.  No movements were seen on the left cheek or upper lip.  VIII:  Normal hearing and vestibular function.   IX-X:  Normal palatal movement.   XI:  Normal shoulder shrug and head rotation.   XII:  Normal tongue strength and range of motion, no deviation or fasciculation.  MOTOR:  No atrophy, fasciculations or abnormal movements.  No pronator drift.   Upper Extremity:  Right  Left  Deltoid  5/5   5/5   Biceps  5/5   5/5   Triceps  5/5   5/5   Wrist extensors  5/5   5/5   Wrist flexors  5/5   5/5   Finger extensors  5/5   5/5   Finger flexors  5/5    5/5   Dorsal interossei  5/5   5/5   Abductor pollicis  5/5   5/5   Tone (Ashworth scale)  0  0   Lower Extremity:  Right  Left  Hip flexors  5/5   5/5   Hip extensors  5/5   5/5   Knee flexors  5/5  5/5   Knee extensors  5/5   5/5   Dorsiflexors  5/5   5/5   Plantarflexors  5/5   5/5   Toe extensors  5/5   5/5   Toe flexors  5/5   5/5   Tone (Ashworth scale)  0  0   MSRs:                                           Right        Left brachioradialis 2+  2+  biceps 2+  2+  triceps 2+  2+  patellar 2+  2+  ankle jerk 2+  2+  Hoffman no  no  plantar response down  down   SENSORY:  Normal and symmetric perception of light touch, pinprick, vibration, and proprioception.  Romberg's sign absent.   COORDINATION/GAIT: Normal finger-to- nose-finger.  Intact rapid alternating movements bilaterally.  Able to rise from a chair without using arms.  Gait narrow based and stable. Tandem and stressed gait intact.     Thank you for allowing me to participate in patient's care.  If I can answer any additional questions, I would be pleased to do so.    Sincerely,    Dru Laurel K. Allena Katz, DO

## 2023-09-20 ENCOUNTER — Ambulatory Visit (INDEPENDENT_AMBULATORY_CARE_PROVIDER_SITE_OTHER): Payer: 59 | Admitting: Psychology

## 2023-09-20 DIAGNOSIS — F4321 Adjustment disorder with depressed mood: Secondary | ICD-10-CM | POA: Diagnosis not present

## 2023-09-20 NOTE — Progress Notes (Signed)
[facility] Counselor/Therapist Progress Note  Patient ID: Kendra Day, MRN: [id number],    Date: 09/20/2023  Time Spent: 45 mins  start time: 1500   end time: 1545  Treatment Type: Individual Therapy  Reported Symptoms: Pt presented for follow up session, via Caregility video.  Pt grants consent for session, stating she is in her home with no one else present; pt states she understands the limits of virtual session.  I shared with pt that I am in my office with no one else present here either.    Mental Status Exam: Appearance:  Casual     Behavior: Appropriate  Motor: Normal  Speech/Language:  Clear and Coherent  Affect: Appropriate  Mood: normal  Thought process: normal  Thought content:   WNL  Sensory/Perceptual disturbances:   WNL  Orientation: oriented to person, place, and time/date  Attention: Good  Concentration: Good  Memory: WNL  Fund of knowledge:  Good  Insight:   Good  Judgment:  Good  Impulse Control: Good   Risk Assessment: Danger to Self:  No Self-injurious Behavior: No Danger to Others: No Duty to Warn:no Physical Aggression / Violence:No  Access to Firearms a concern: No  Gang Involvement:No   Subjective: Pt shares that "I have been pretty good since our last session, but work has been busy.  This is always our busy season and it is even busier than normal.  It is just a little crazy."  Pt appreciates being busy and appreciates the positive perspective that others in the office have for her.  Pt shares that [name] is doing well; "[name] has been bearing the brunt of [name]."  Pt shares that [name] is going to be working for her office for 30 hours per week, through the end of the year.  He is going to be making good money and pt is interested to see how he will perform in the office setting.  Pt shares that [name] has continued to have issues being obstinate with them but it has been a bit less.  Pt shares that she and [name] have been working  on puzzles, she is listening to audio books and  walking the dogs, etc.  Pt shares that she has been sleeping pretty well as well.  [name] is doing well at school.  He has found an apt for next year with his current roommate.  Encouraged pt to continue with her self care activities and we will meet in 3 wks for a follow up session.   Interventions: Cognitive Behavioral Therapy  Diagnosis:Adjustment disorder with depressed mood  Plan: Treatment Plan Strengths/Abilities:  Intelligent, Intuitive, Willing to participate in therapy Treatment Preferences:  Outpatient Individual Therapy Statement of Needs:  Patient is to use CBT, mindfulness and coping skills to help manage and/or decrease symptoms associated with their diagnosis. Symptoms:  Depressed/Irritable mood, worry, social withdrawal Problems Addressed:  Depressive thoughts, Sadness, Sleep issues, etc. Long Term Goals:  Pt to reduce overall level, frequency, and intensity of the feelings of depression as evidenced by decreased irritability, negative self talk, and helpless feelings from 6 to 7 days/week to 0 to 1 days/week, per client report, for at least 3 consecutive months.  Progress: 30% Short Term Goals:  Pt to verbally express understanding of the relationship between feelings of depression and their impact on thinking patterns and behaviors.  Pt to verbalize an understanding of the role that distorted thinking plays in creating fears, excessive worry, and ruminations.  Progress: 30% Target Date:  11/24/2023 Frequency:  Monthly Modality:  Cognitive Behavioral Therapy Interventions by Therapist:  Therapist will use CBT, Mindfulness exercises, Coping skills and Referrals, as needed by client. Client has verbally approved this treatment plan.  Karie Kirks, [facility]

## 2023-09-28 LAB — COPPER, SERUM: Copper: 147 ug/dL (ref 70–175)

## 2023-09-29 ENCOUNTER — Other Ambulatory Visit (INDEPENDENT_AMBULATORY_CARE_PROVIDER_SITE_OTHER): Payer: Self-pay | Admitting: Family Medicine

## 2023-09-29 DIAGNOSIS — F39 Unspecified mood [affective] disorder: Secondary | ICD-10-CM

## 2023-10-01 ENCOUNTER — Encounter (INDEPENDENT_AMBULATORY_CARE_PROVIDER_SITE_OTHER): Payer: Self-pay | Admitting: Family Medicine

## 2023-10-01 ENCOUNTER — Ambulatory Visit (INDEPENDENT_AMBULATORY_CARE_PROVIDER_SITE_OTHER): Payer: 59 | Admitting: Family Medicine

## 2023-10-01 DIAGNOSIS — Z6832 Body mass index (BMI) 32.0-32.9, adult: Secondary | ICD-10-CM | POA: Diagnosis not present

## 2023-10-01 DIAGNOSIS — R7303 Prediabetes: Secondary | ICD-10-CM | POA: Diagnosis not present

## 2023-10-01 DIAGNOSIS — F39 Unspecified mood [affective] disorder: Secondary | ICD-10-CM

## 2023-10-01 DIAGNOSIS — F5089 Other specified eating disorder: Secondary | ICD-10-CM | POA: Diagnosis not present

## 2023-10-01 MED ORDER — BUPROPION HCL ER (SR) 150 MG PO TB12
150.0000 mg | ORAL_TABLET | Freq: Two times a day (BID) | ORAL | 0 refills | Status: DC
Start: 2023-10-01 — End: 2023-11-13

## 2023-10-01 NOTE — Progress Notes (Signed)
[name], D.O.  ABFM, ABOM Specializing in Clinical Bariatric Medicine  Office located at: [address] [place], [place]  [address]    Assessment and Plan:  Review labs next OV Orders Placed This Encounter  Procedures   Hemoglobin A1c   Comprehensive metabolic panel   Medications Discontinued During This Encounter  Medication Reason   buPROPion (WELLBUTRIN SR) 150 MG 12 hr tablet Reorder    Meds ordered this encounter  Medications   buPROPion (WELLBUTRIN SR) 150 MG 12 hr tablet    Sig: Take 1 tablet (150 mg total) by mouth 2 (two) times daily.    Dispense:  60 tablet    Refill:  0    Pre-diabetes Assessment: Condition is Not at goal.. This is diet/exercise controlled. Pt endorses having minimal cravings and hunger especially when not eating on the prescribed meal plan.  Lab Results  Component Value Date   HGBA1C 5.8 (H) 04/22/2023   HGBA1C 5.8 (H) 12/13/2022   HGBA1C 5.8 (H) 06/20/2022   INSULIN 9.7 04/22/2023   INSULIN 9.2 12/13/2022   INSULIN 8.3 12/18/2021    Plan: - Continue her prudent nutritional plan that is low in simple carbohydrates, saturated fats and trans fats to goal of 5-10% weight loss to achieve significant health benefits.  Pt encouraged to continually advance exercise and cardiovascular fitness as tolerated throughout weight loss journey.  - Continue to decrease simple carbs/ sugars; increase fiber and proteins -> follow her meal plan.    - Anticipatory guidance given.    - We will recheck A1c and fasting insulin levels today.    Mood disorder (HCC), with emotional eating Assessment: Condition is Improving. Denies any SI/HI. Mood is stable. Cravings and hunger are well controlled. Pt continues to read, walk, and do puzzles for stress management. Since starting Wellbutrin she endorses this working well for her with her emotional eating and less food noise. She snacks less and feels full when eating things on plan.   Plan: - Continue  Wellbutrin SR as directed.  I will refill today.   - Continue with stress management techniques such as exercise and self care by doing things she loves.   - Reminded patient of the importance of following their prudent nutrition plan and how food can affect mood as well to support emotional wellbeing.    TREATMENT PLAN FOR OBESITY: BMI 32.0-32.9,adult-current bmi 32.12 Morbid obesity (HCC)-start bmi 39.77/date 10/12/20 Assessment:  Kendra Day is here to discuss her progress with her obesity treatment plan along with follow-up of her obesity related diagnoses. See Medical Weight Management Flowsheet for complete bioelectrical impedance results.  Condition is improving but not optimized. Biometric data collected today, was reviewed with patient.   Since last office visit on 09/04/2023 patient's  Muscle mass has decreased by 1lb. Fat mass has increased by 1.8lb. Total body water has increased by 1lb.  Counseling done on how various foods will affect these numbers and how to maximize success  Total lbs lost to date: 46 Total weight loss percentage to date: 19.25%   Plan: - Kendra Day will continue to follow the  Category 2 Plan and keeping a food journal and adhering to recommended goals of 1300-1400 calories and 90+ protein   Behavioral Intervention Additional resources provided today: patient declined Evidence-based interventions for health behavior change were utilized today including the discussion of self monitoring techniques, problem-solving barriers and SMART goal setting techniques.   Regarding patient's less desirable eating habits and patterns, we employed  the technique of small changes.  Pt will specifically work on: Get back on track for next visit.     She has agreed to Continue current level of physical activity  and Think about enjoyable ways to increase daily physical activity and overcoming barriers to exercise   FOLLOW UP: Return in about 4 weeks (around  10/29/2023).  She was informed of the importance of frequent follow up visits to maximize her success with intensive lifestyle modifications for her multiple health conditions.Kendra Day is aware that we will review all of her lab results at our next visit together in person.  She is aware that if anything is critical/ life threatening with the results, we will be contacting her via MyChart or by my CMA will be calling them prior to the office visit to discuss acute management.    Subjective:   Chief complaint: Obesity Kendra Day is here to discuss her progress with her obesity treatment plan. She is on the the Category 2 Plan and keeping a food journal and adhering to recommended goals of 1300-1400 calories and 90+ protein and states she is following her eating plan approximately 30% of the time. She states she is walking 30 minutes 3-4 days per week.  Interval History:  Kendra Day is here for a follow up office visit.     Since last office visit:  Pt has been busy and a little stressed. She has been trying to balance work and social life as she is working longer hours now. She tries to read and do puzzles to help relieve stress and continues to walk. She informed me she drinks 5-6 26oz water bottles daily.   We reviewed her meal plan and all questions were answered.   Pharmacotherapy for weight loss: She is currently taking Wellbutrin for medical weight loss.  Denies side effects.    Review of Systems:  Pertinent positives were addressed with patient today.  Reviewed by clinician on day of visit: allergies, medications, problem list, medical history, surgical history, family history, social history, and previous encounter notes.  Weight Summary and Biometrics   Weight Lost Since Last Visit: 0lb  Weight Gained Since Last Visit: 0lb   Vitals Temp: 98.1 F (36.7 C) BP: 110/75 Pulse Rate: 72 SpO2: 99 %   Anthropometric Measurements Height: 5\' 5"  (1.651 m) Weight:  193 lb (87.5 kg) BMI (Calculated): 32.12 Weight at Last Visit: 193lb Weight Lost Since Last Visit: 0lb Weight Gained Since Last Visit: 0lb Starting Weight: 239lb Total Weight Loss (lbs): 46 lb (20.9 kg) Peak Weight: 244lb   Body Composition  Body Fat %: 42.1 % Fat Mass (lbs): 81.6 lbs Muscle Mass (lbs): 106.4 lbs Total Body Water (lbs): 79.4 lbs Visceral Fat Rating : 10   Other Clinical Data Fasting: no Labs: yes Today's Visit #: 40 Starting Date: 10/12/20     Objective:   PHYSICAL EXAM: Blood pressure 110/75, pulse 72, temperature 98.1 F (36.7 C), height 5\' 5"  (1.651 m), weight 193 lb (87.5 kg), SpO2 99%. Body mass index is 32.12 kg/m.  General: Well Developed, well nourished, and in no acute distress.  HEENT: Normocephalic, atraumatic Skin: Warm and dry, cap RF less 2 sec, good turgor Chest:  Normal excursion, shape, no gross abn Respiratory: speaking in full sentences, no conversational dyspnea NeuroM-Sk: Ambulates w/o assistance, moves * 4 Psych: A and O *3, insight good, mood-full  DIAGNOSTIC DATA REVIEWED:  BMET    Component Value Date/Time   NA 140 04/22/2023 0939  K 4.8 04/22/2023 0939   CL 105 04/22/2023 0939   CO2 24 04/22/2023 0939   GLUCOSE 101 (H) 04/22/2023 0939   GLUCOSE 104 (H) 02/21/2014 1124   BUN 24 04/22/2023 0939   CREATININE 1.04 (H) 04/22/2023 0939   CALCIUM 10.0 04/22/2023 0939   GFRNONAA 66 (L) 02/21/2014 1124   GFRAA 76 (L) 02/21/2014 1124   Lab Results  Component Value Date   HGBA1C 5.8 (H) 04/22/2023   HGBA1C 5.6 04/19/2021   Lab Results  Component Value Date   INSULIN 9.7 04/22/2023   INSULIN 10.8 10/12/2020   Lab Results  Component Value Date   TSH 0.617 04/22/2023   CBC    Component Value Date/Time   WBC 7.6 02/21/2014 1124   RBC 4.88 02/21/2014 1124   HGB 14.6 02/21/2014 1124   HCT 41.6 02/21/2014 1124   PLT 218 02/21/2014 1124   MCV 85.2 02/21/2014 1124   MCH 29.9 02/21/2014 1124   MCHC 35.1  02/21/2014 1124   RDW 12.6 02/21/2014 1124   Iron Studies No results found for: "IRON", "TIBC", "FERRITIN", "IRONPCTSAT" Lipid Panel     Component Value Date/Time   CHOL 201 (A) 01/11/2023 0000   CHOL 179 12/18/2021 0901   TRIG 107 01/11/2023 0000   HDL 68 12/18/2021 0901   CHOLHDL 2.6 12/18/2021 0901   LDLCALC 95 12/18/2021 0901   Hepatic Function Panel     Component Value Date/Time   PROT 6.9 04/22/2023 0939   ALBUMIN 4.4 04/22/2023 0939   AST 21 04/22/2023 0939   ALT 23 04/22/2023 0939   ALKPHOS 146 (H) 04/22/2023 0939   BILITOT 0.3 04/22/2023 0939      Component Value Date/Time   TSH 0.617 04/22/2023 0939   Nutritional Lab Results  Component Value Date   VD25OH 53.6 04/22/2023   VD25OH 49.5 12/13/2022   VD25OH 70.6 06/20/2022    Attestations:   I, Clinical biochemist, acting as a Stage manager for [employer], DO., have compiled all relevant documentation for today's office visit on behalf of Thomasene Lot, DO, while in the presence of [employer], DO.  I have reviewed the above documentation for accuracy and completeness, and I agree with the above. [name], D.O.  The 21st Century Cures Act was signed into law in 2016 which includes the topic of electronic health records.  This provides immediate access to information in MyChart.  This includes consultation notes, operative notes, office notes, lab results and pathology reports.  If you have any questions about what you read please let us know at your next visit so we can discuss your concerns and take corrective action if need be.  We are right here with you.

## 2023-10-02 LAB — COMPREHENSIVE METABOLIC PANEL
ALT: 15 [IU]/L (ref 0–32)
AST: 17 [IU]/L (ref 0–40)
Albumin: 4.4 g/dL (ref 3.8–4.9)
Alkaline Phosphatase: 135 [IU]/L — ABNORMAL HIGH (ref 44–121)
BUN/Creatinine Ratio: 26 — ABNORMAL HIGH (ref 9–23)
BUN: 29 mg/dL — ABNORMAL HIGH (ref 6–24)
Bilirubin Total: 0.3 mg/dL (ref 0.0–1.2)
CO2: 22 mmol/L (ref 20–29)
Calcium: 9.6 mg/dL (ref 8.7–10.2)
Chloride: 103 mmol/L (ref 96–106)
Creatinine, Ser: 1.13 mg/dL — ABNORMAL HIGH (ref 0.57–1.00)
Globulin, Total: 2.6 g/dL (ref 1.5–4.5)
Glucose: 77 mg/dL (ref 70–99)
Potassium: 5 mmol/L (ref 3.5–5.2)
Sodium: 142 mmol/L (ref 134–144)
Total Protein: 7 g/dL (ref 6.0–8.5)
eGFR: 58 mL/min/{1.73_m2} — ABNORMAL LOW (ref >59)

## 2023-10-02 LAB — HEMOGLOBIN A1C
Est. average glucose Bld gHb Est-mCnc: 123 mg/dL
Hgb A1c MFr Bld: 5.9 % — ABNORMAL HIGH (ref 4.8–5.6)

## 2023-10-11 ENCOUNTER — Other Ambulatory Visit: Payer: Self-pay | Admitting: Medical Genetics

## 2023-10-11 ENCOUNTER — Ambulatory Visit (INDEPENDENT_AMBULATORY_CARE_PROVIDER_SITE_OTHER): Payer: 59 | Admitting: Psychology

## 2023-10-11 DIAGNOSIS — F4321 Adjustment disorder with depressed mood: Secondary | ICD-10-CM

## 2023-10-11 DIAGNOSIS — Z006 Encounter for examination for normal comparison and control in clinical research program: Secondary | ICD-10-CM

## 2023-10-11 NOTE — Progress Notes (Signed)
[facility] Counselor/Therapist Progress Note  Patient ID: Kendra Day, MRN: [id number],    Date: 10/11/2023  Time Spent: 60 mins  start time: 1400   end time: 1500  Treatment Type: Individual Therapy  Reported Symptoms: Pt presented for follow up session, via Caregility video.  Pt grants consent for session, stating she is in her home with no one else present; pt states she understands the limits of virtual session.  I shared with pt that I am in my office with no one else present here either.    Mental Status Exam: Appearance:  Casual     Behavior: Appropriate  Motor: Normal  Speech/Language:  Clear and Coherent  Affect: Appropriate  Mood: normal  Thought process: normal  Thought content:   WNL  Sensory/Perceptual disturbances:   WNL  Orientation: oriented to person, place, and time/date  Attention: Good  Concentration: Good  Memory: WNL  Fund of knowledge:  Good  Insight:   Good  Judgment:  Good  Impulse Control: Good   Risk Assessment: Danger to Self:  No Self-injurious Behavior: No Danger to Others: No Duty to Warn:no Physical Aggression / Violence:No  Access to Firearms a concern: No  Gang Involvement:No   Subjective: Pt shares that "I have been pretty good since our last session, but work has been really crazy.  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  He was having trouble sleeping because he was so anxious about getting up in the morning."  Pt shares that [name] continues to be demanding of them; pt shares that she and [name] were talking about at lunch about whether or not [name] feels like he is as loved as [name].  Pt is clear that they are providing well for [name] and doing all that they need to with [name].  Pt shares that [name] continues to be very disrespectful to both pt and [name] and that continues to be troubling for both of them.  Pt and [name] both want to be as helpful to [name] as they can be so we talked about  the option of inviting him again to be evaluated for a psychological issue.  Pt shares the results of the election were not what she had wanted, but she is able to allow others to make their own choices.  Encouraged pt to continue with her self care activities and we will meet in 2 wks for a follow up session.   Interventions: Cognitive Behavioral Therapy  Diagnosis:Adjustment disorder with depressed mood  Plan: Treatment Plan Strengths/Abilities:  Intelligent, Intuitive, Willing to participate in therapy Treatment Preferences:  Outpatient Individual Therapy Statement of Needs:  Patient is to use CBT, mindfulness and coping skills to help manage and/or decrease symptoms associated with their diagnosis. Symptoms:  Depressed/Irritable mood, worry, social withdrawal Problems Addressed:  Depressive thoughts, Sadness, Sleep issues, etc. Long Term Goals:  Pt to reduce overall level, frequency, and intensity of the feelings of depression as evidenced by decreased irritability, negative self talk, and helpless feelings from 6 to 7 days/week to 0 to 1 days/week, per client report, for at least 3 consecutive months.  Progress: 30% Short Term Goals:  Pt to verbally express understanding of the relationship between feelings of depression and their impact on thinking patterns and behaviors.  Pt to verbalize an understanding of the role that distorted thinking plays in creating fears, excessive worry, and ruminations.  Progress: 30% Target Date:  11/24/2023 Frequency:  Monthly Modality:  Cognitive Behavioral Therapy Interventions by Therapist:  Therapist will use CBT, Mindfulness exercises, Coping skills and Referrals, as needed by client. Client has verbally approved this treatment plan.  Karie Kirks, [facility]

## 2023-10-28 ENCOUNTER — Other Ambulatory Visit (INDEPENDENT_AMBULATORY_CARE_PROVIDER_SITE_OTHER): Payer: Self-pay | Admitting: Family Medicine

## 2023-10-28 ENCOUNTER — Ambulatory Visit (INDEPENDENT_AMBULATORY_CARE_PROVIDER_SITE_OTHER): Payer: 59 | Admitting: Family Medicine

## 2023-10-28 DIAGNOSIS — F39 Unspecified mood [affective] disorder: Secondary | ICD-10-CM

## 2023-10-30 ENCOUNTER — Ambulatory Visit: Payer: 59 | Admitting: Psychology

## 2023-10-30 DIAGNOSIS — F4321 Adjustment disorder with depressed mood: Secondary | ICD-10-CM | POA: Diagnosis not present

## 2023-10-30 NOTE — Progress Notes (Signed)
[facility] Counselor/Therapist Progress Note  Patient ID: Kendra Day, MRN: [id number],    Date: 10/30/2023  Time Spent: 60 mins  start time: 1100   end time: 1200  Treatment Type: Individual Therapy  Reported Symptoms: Pt presented for follow up session, via Caregility video.  Pt grants consent for session, stating she is in her home with no one else present; pt states she understands the limits of virtual session.  I shared with pt that I am in my office with no one else present here either.    Mental Status Exam: Appearance:  Casual     Behavior: Appropriate  Motor: Normal  Speech/Language:  Clear and Coherent  Affect: Appropriate  Mood: normal  Thought process: normal  Thought content:   WNL  Sensory/Perceptual disturbances:   WNL  Orientation: oriented to person, place, and time/date  Attention: Good  Concentration: Good  Memory: WNL  Fund of knowledge:  Good  Insight:   Good  Judgment:  Good  Impulse Control: Good   Risk Assessment: Danger to Self:  No Self-injurious Behavior: No Danger to Others: No Duty to Warn:no Physical Aggression / Violence:No  Access to Firearms a concern: No  Gang Involvement:No   Subjective: Pt shares that "We are planning to go to [place] tomorrow to my sister's for Thanksgiving; my mom and my family will join my sister and her family so it will be nice."  Pt shares she has continued to be very busy at work but she anticipates it is slowing down now through the [employer].  Pt shares that [name] has been really difficult this week since [name] has been home; things that he does not normally do when [name] is at school.  He continues to blame pt and [name] for things that he is missing, etc.  He is no longer interested in leaving the home and pt and [name] have talked with him about starting the eviction process if [name] cannot be nicer to them.  [name] has continued to work in the office with pt and she shares that he has  treated her and others acceptably in the office; they have shared with [name] that he needs to treat them the same way at home as he did in the office but he has not been able to do that.  Pt and [name] are on the same page that it is time to begin the eviction process again for him to have him legally removed from their home.  Encouraged pt to tell her extended family about their decision about evicting [name] so he will not be able to distort the story to them.  [name] will be 24 yo in Jan; his school peers are all seniors in college now.  Pt shares that she recently had lunch with many of her previous colleagues to celebrate her birthday and it was a nice time for pt; her former bosses' wife joined them and told them their former boss is continuing to improve.  Her former boss sent her a birthday card and wrote the message in the card for her and this was very meaningful for pt.  Pt shares that she needs to be getting more exercise in; she has been reading, working on puzzles, and she has been working on [employer] as well; she has also continued to walk the dogs as well.  She is also going to the movies with a friend on Monday.  Encouraged pt to continue with her self care activities and  we will meet in 3 wks for a follow up session.   Interventions: Cognitive Behavioral Therapy  Diagnosis:Adjustment disorder with depressed mood  Plan: Treatment Plan Strengths/Abilities:  Intelligent, Intuitive, Willing to participate in therapy Treatment Preferences:  Outpatient Individual Therapy Statement of Needs:  Patient is to use CBT, mindfulness and coping skills to help manage and/or decrease symptoms associated with their diagnosis. Symptoms:  Depressed/Irritable mood, worry, social withdrawal Problems Addressed:  Depressive thoughts, Sadness, Sleep issues, etc. Long Term Goals:  Pt to reduce overall level, frequency, and intensity of the feelings of depression as evidenced by decreased irritability, negative self talk,  and helpless feelings from 6 to 7 days/week to 0 to 1 days/week, per client report, for at least 3 consecutive months.  Progress: 30% Short Term Goals:  Pt to verbally express understanding of the relationship between feelings of depression and their impact on thinking patterns and behaviors.  Pt to verbalize an understanding of the role that distorted thinking plays in creating fears, excessive worry, and ruminations.  Progress: 30% Target Date:  11/24/2023 Frequency:  Monthly Modality:  Cognitive Behavioral Therapy Interventions by Therapist:  Therapist will use CBT, Mindfulness exercises, Coping skills and Referrals, as needed by client. Client has verbally approved this treatment plan.  Karie Kirks, [facility]

## 2023-11-07 ENCOUNTER — Other Ambulatory Visit (HOSPITAL_COMMUNITY)
Admission: RE | Admit: 2023-11-07 | Discharge: 2023-11-07 | Disposition: A | Discharge status: Home / Self Care | Payer: Self-pay | Source: Ambulatory Visit | Attending: Medical Genetics | Admitting: Medical Genetics

## 2023-11-07 DIAGNOSIS — Z006 Encounter for examination for normal comparison and control in clinical research program: Secondary | ICD-10-CM | POA: Insufficient documentation

## 2023-11-13 ENCOUNTER — Ambulatory Visit (INDEPENDENT_AMBULATORY_CARE_PROVIDER_SITE_OTHER): Payer: 59 | Admitting: Family Medicine

## 2023-11-13 ENCOUNTER — Encounter (INDEPENDENT_AMBULATORY_CARE_PROVIDER_SITE_OTHER): Payer: Self-pay | Admitting: Family Medicine

## 2023-11-13 VITALS — BP 138/84 | HR 69 | Temp 98.2°F | Ht 65.0 in | Wt 196.0 lb

## 2023-11-13 DIAGNOSIS — F4323 Adjustment disorder with mixed anxiety and depressed mood: Secondary | ICD-10-CM | POA: Diagnosis not present

## 2023-11-13 DIAGNOSIS — R7989 Other specified abnormal findings of blood chemistry: Secondary | ICD-10-CM

## 2023-11-13 DIAGNOSIS — F39 Unspecified mood [affective] disorder: Secondary | ICD-10-CM

## 2023-11-13 DIAGNOSIS — R7303 Prediabetes: Secondary | ICD-10-CM | POA: Diagnosis not present

## 2023-11-13 DIAGNOSIS — E66812 Obesity, class 2: Secondary | ICD-10-CM

## 2023-11-13 DIAGNOSIS — Z6832 Body mass index (BMI) 32.0-32.9, adult: Secondary | ICD-10-CM

## 2023-11-13 DIAGNOSIS — F5089 Other specified eating disorder: Secondary | ICD-10-CM

## 2023-11-13 DIAGNOSIS — E669 Obesity, unspecified: Secondary | ICD-10-CM

## 2023-11-13 MED ORDER — BUPROPION HCL ER (XL) 150 MG PO TB24
ORAL_TABLET | ORAL | 1 refills | Status: AC
Start: 2023-11-13 — End: ?

## 2023-11-13 NOTE — Progress Notes (Incomplete)
Kendra Day, D.O.  ABFM, ABOM Specializing in Clinical Bariatric Medicine  Office located at: [address] [place], [place]  [address]   Assessment and Plan:   FOR THE DISEASE OF OBESITY: BMI 32.0-32.9,adult Obesity, current BMI 32.6 Assessment & Plan: Since last office visit on 10/01/23 patient's muscle mass has decreased by 1.4lb. Fat mass has increased by 3.8lb. Total body water has increased by 2.6lb.  Counseling done on how various foods will affect these numbers and how to maximize success  Total lbs lost to date: 43 lbs  Total weight loss percentage to date: -17.99%    Recommended Dietary Goals Kendra Day is currently in the action stage of change. As such, her goal is to continue weight management plan.  She has agreed to: continue current plan   Behavioral Intervention We discussed the following today: {dowtlossstrategies:31654}  Additional resources provided today: Handout on traveling and holiday eating strategies  Evidence-based interventions for health behavior change were utilized today including the discussion of self monitoring techniques, problem-solving barriers and SMART goal setting techniques.   Regarding patient's less desirable eating habits and patterns, we employed the technique of small changes.   Pt will specifically work on: Eating healthy to prioritize physical/mental health for next visit.    Recommended Physical Activity Goals [place] has been advised to work up to 150 minutes of moderate intensity aerobic activity a week and strengthening exercises 2-3 times per week for cardiovascular health, weight loss maintenance and preservation of muscle mass.   She has agreed to :  Continue current level of physical activity for now to prioritize physical/mental health over weight loss.    Pharmacotherapy We discussed various medication options to help Kendra Day with her weight loss efforts and we both agreed to :  {EMagreedrx:29170::"continue with nutritional and behavioral strategies"}   FOR ASSOCIATED CONDITIONS ADDRESSED TODAY: Pre-diabetes Assessment & Plan: Lab Results  Component Value Date   HGBA1C 5.9 (H) 10/01/2023   HGBA1C 5.8 (H) 04/22/2023   HGBA1C 5.8 (H) 12/13/2022   INSULIN 9.7 04/22/2023   INSULIN 9.2 12/13/2022   INSULIN 8.3 12/18/2021    *** A1c as Slightly worsened but is still under good controll  Contue with prudent nutritional plan  A1C was slighlty elevated - cont'd to manage with healthy diet/exercise   Elevated serum creatinine Assessment & Plan: *** Kidney function Elevated creatine  Pt Bun/creatine very stable and very similar to levels about 1 year ago  Drinking 120 fl oz of water a day  -increase water intake if exercising  Doesn't typically eat high sodium foods- though she eats out  BP is stable.  Cont to avoid nephrotoxic substances and follow up with PCP and/or speciallists as directed.      Acute situational stressors Assessment & Plan: ***  Cont to exercise Work stress also bad, will improve within the next month  Hopes to get back into her routine Continue with behavioral health -- Dr. Jennelle Human  Focus on going to the gym 3 days/week  to manage stress  Continue to      Mood disorder [facility]), with emotional eating Assessment & Plan: ***  Struggling with Emotional eating  Pt states she is about 80% compliant  Seeing Dr. Jennelle Human about every 3 weeks  -We reviewed her options of increasing her Wellbutrin  Because she  -pt struggling to remember to take second dose of Wellbutrin.  -If after taking wellbutrin for 1 month consistently you feel it has only slightly improved sx, double  dose daily  -Reviewed common side effects of Wellbutrin -Discussed benefits of wellbtruin including -- Energy, focus, and helps with depression and emotional eating -Reviewed alternative options to eating out such as meal prepping and planning (Life long meal  prep).  -focus on eating healthy and efforts to prioritize mental health "Doesn't want food to be a battle"    Has been struggling to remember to take her second dose of Wellbutrin.    Currently on Wellbutrin 150 mg BID, tolerating well.    Pt reports significant stressors with her 50 year old son that currently lives with her. She notes this is a finite situation that will soon improve in about 2 months.    Orders: -     buPROPion HCl ER (XL); 1 po q am  Dispense: 30 tablet; Refill: 1   Follow up:   Return in about 5 weeks (around 12/18/2023). She was informed of the importance of frequent follow up visits to maximize her success with intensive lifestyle modifications for her multiple health conditions.  Subjective:   Chief complaint: Obesity Kendra Day is here to discuss her progress with her obesity treatment plan. She is on the Category 2 Plan and keeping a food journal and adhering to recommended goals of 1300-1400 calories and 90+ g protein and states she is following her eating plan approximately 25% of the time. She states she is exercising 30 minutes 2-3 days per week.  Interval History:  Kendra Day is here for a follow up office visit. Since last OV, up 3 lbs. She has been eating off-plan due to acute situational stressors. Has been struggling to cook at home due to these stressors at home and has been eating out as a result.   Barriers identified: Situational stressors at home and work - soon to improve in 1-2 months.   Pharmacotherapy for weight loss: She is currently taking Bupropion (single agent, off label use) with adequate clinical response  and without side effects..   Review of Systems:  Pertinent positives were addressed with patient today.  Reviewed by clinician on day of visit: allergies, medications, problem list, medical history, surgical history, family history, social history, and previous encounter notes.  Weight Summary and Biometrics    Weight Lost Since Last Visit: 0  Weight Gained Since Last Visit: 3 lb    Vitals Temp: 98.2 F (36.8 C) BP: 138/84 Pulse Rate: 69 SpO2: 100 %   Anthropometric Measurements Height: 5\' 5"  (1.651 m) Weight: 196 lb (88.9 kg) BMI (Calculated): 32.62 Weight at Last Visit: 193 lb Weight Lost Since Last Visit: 0 Weight Gained Since Last Visit: 3 lb Starting Weight: 239 lb Total Weight Loss (lbs): 43 lb (19.5 kg) Peak Weight: 244 lb   Body Composition  Body Fat %: 43.6 % Fat Mass (lbs): 85.4 lbs Muscle Mass (lbs): 105 lbs Total Body Water (lbs): 82 lbs Visceral Fat Rating : 11   Other Clinical Data Fasting: no Labs: no Today's Visit #: 41 Starting Date: 10/12/20    Objective:   PHYSICAL EXAM: Blood pressure 138/84, pulse 69, temperature 98.2 F (36.8 C), height 5\' 5"  (1.651 m), weight 196 lb (88.9 kg), SpO2 100%. Body mass index is 32.62 kg/m.  General: she is overweight, cooperative and in no acute distress. PSYCH: Has normal mood, affect and thought process.   HEENT: EOMI, sclerae are anicteric. Lungs: Normal breathing effort, no conversational dyspnea. Extremities: Moves * 4 Neurologic: A and O * 3, good insight  DIAGNOSTIC DATA REVIEWED: BMET  Component Value Date/Time   NA 142 10/01/2023 0829   K 5.0 10/01/2023 0829   CL 103 10/01/2023 0829   CO2 22 10/01/2023 0829   GLUCOSE 77 10/01/2023 0829   GLUCOSE 104 (H) 02/21/2014 1124   BUN 29 (H) 10/01/2023 0829   CREATININE 1.13 (H) 10/01/2023 0829   CALCIUM 9.6 10/01/2023 0829   GFRNONAA 66 (L) 02/21/2014 1124   GFRAA 76 (L) 02/21/2014 1124   Lab Results  Component Value Date   HGBA1C 5.9 (H) 10/01/2023   HGBA1C 5.6 04/19/2021   Lab Results  Component Value Date   INSULIN 9.7 04/22/2023   INSULIN 10.8 10/12/2020   Lab Results  Component Value Date   TSH 0.617 04/22/2023   CBC    Component Value Date/Time   WBC 7.6 02/21/2014 1124   RBC 4.88 02/21/2014 1124   HGB 14.6 02/21/2014  1124   HCT 41.6 02/21/2014 1124   PLT 218 02/21/2014 1124   MCV 85.2 02/21/2014 1124   MCH 29.9 02/21/2014 1124   MCHC 35.1 02/21/2014 1124   RDW 12.6 02/21/2014 1124   Iron Studies No results found for: "IRON", "TIBC", "FERRITIN", "IRONPCTSAT" Lipid Panel     Component Value Date/Time   CHOL 201 (A) 01/11/2023 0000   CHOL 179 12/18/2021 0901   TRIG 107 01/11/2023 0000   HDL 68 12/18/2021 0901   CHOLHDL 2.6 12/18/2021 0901   LDLCALC 95 12/18/2021 0901   Hepatic Function Panel     Component Value Date/Time   PROT 7.0 10/01/2023 0829   ALBUMIN 4.4 10/01/2023 0829   AST 17 10/01/2023 0829   ALT 15 10/01/2023 0829   ALKPHOS 135 (H) 10/01/2023 0829   BILITOT 0.3 10/01/2023 0829      Component Value Date/Time   TSH 0.617 04/22/2023 0939   Nutritional Lab Results  Component Value Date   VD25OH 53.6 04/22/2023   VD25OH 49.5 12/13/2022   VD25OH 70.6 06/20/2022    Attestations:   I, Kendra Day, acting as a Stage manager for Thomasene Lot, DO., have compiled all relevant documentation for today's office visit on behalf of Thomasene Lot, DO, while in the presence of Kendra & McLennan, DO.  Reviewed by clinician on day of visit: allergies, medications, problem list, medical history, surgical history, family history, social history, and previous encounter notes pertinent to patient's obesity diagnosis. I have reviewed the above documentation for accuracy and completeness, and I agree with the above. Kendra Day, D.O.  The 21st Century Cures Act was signed into law in 2016 which includes the topic of electronic health records.  This provides immediate access to information in MyChart.  This includes consultation notes, operative notes, office notes, lab results and pathology reports.  If you have any questions about what you read please let us know at your next visit so we can discuss your concerns and take corrective action if need be.  We are right here with you.

## 2023-11-18 ENCOUNTER — Ambulatory Visit (INDEPENDENT_AMBULATORY_CARE_PROVIDER_SITE_OTHER): Payer: 59 | Admitting: Family Medicine

## 2023-11-18 LAB — GENECONNECT MOLECULAR SCREEN: Genetic Analysis Overall Interpretation: NEGATIVE

## 2023-11-20 ENCOUNTER — Ambulatory Visit: Payer: 59 | Admitting: Psychology

## 2023-11-20 DIAGNOSIS — F4321 Adjustment disorder with depressed mood: Secondary | ICD-10-CM | POA: Diagnosis not present
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
## 2023-12-13 ENCOUNTER — Ambulatory Visit: Payer: Self-pay | Admitting: Psychology

## 2023-12-13 ENCOUNTER — Ambulatory Visit: Payer: 59 | Admitting: Psychology

## 2023-12-13 DIAGNOSIS — F4321 Adjustment disorder with depressed mood: Secondary | ICD-10-CM | POA: Diagnosis not present

## 2023-12-13 NOTE — Progress Notes (Signed)
 [facility] Counselor/Therapist Progress Note  Patient ID: Kendra Day, MRN: [id number],    Date: 12/13/2023  Time Spent: 45 mins  start time: 1600   end time: 1645  Treatment Type: Individual Therapy  Reported Symptoms: Pt presented for follow up session, via Caregility video.  Pt grants consent for session, stating she is in her home with no one else present; pt states she understands the limits of virtual session.  I shared with pt that I am in my office with no one else present here either.    Mental Status Exam: Appearance:  Casual     Behavior: Appropriate  Motor: Normal  Speech/Language:  Clear and Coherent  Affect: Appropriate  Mood: normal  Thought process: normal  Thought content:   WNL  Sensory/Perceptual disturbances:   WNL  Orientation: oriented to person, place, and time/date  Attention: Good  Concentration: Good  Memory: WNL  Fund of knowledge:  Good  Insight:   Good  Judgment:  Good  Impulse Control: Good   Risk Assessment: Danger to Self:  No Self-injurious Behavior: No Danger to Others: No Duty to Warn:no Physical Aggression / Violence:No  Access to Firearms a concern: No  Gang Involvement:No   Subjective: Pt shares that Christmas was pretty uneventful.  Gordy went to [facility] with a friend of his for 5 days and got back home last night.  We had a few days of peace and quiet and that was good.  We talked to him a couple of times while he was there just to be sure he was OK and that was good.  Pt shares that Gordy turns 53 yo today and he told them he had gotten a job with [employer] but pt is not sure about that.  They have told Gordy that he needs to be out of their home by the end of January and pt is not sure if it will actually happen.  They would prefer not to use the nuclear option of eviction if they can avoid it.  They are still willing to give him a sum of money to help him with an apt, etc for a period of time.  Debby is still  home and will be going back to [employer] next week for Spring semester.  Pt shares that they went to see [name]'s sister and her family in [place] (the sister who lost her husband almost a year ago).  Her health is getting worse and the young adult children are having difficulty making decisions for their mom and the mom is not making decisions for herself.  Pt and Lamar are going to [facility] in March to see a band he likes; they are going to a music festival in [place] in April, and they are taking their [place] family vacation in May.  Pt has been trying to get back to the gym, she has been working on puzzles, she is still walking their dogs, listening to Audio books and being intentional about getting good sleep.  Encouraged pt to continue with her self care activities and we will meet in 3 wks for a follow up session.   Interventions: Cognitive Behavioral Therapy  Diagnosis:Adjustment disorder with depressed mood  Plan: Treatment Plan Strengths/Abilities:  Intelligent, Intuitive, Willing to participate in therapy Treatment Preferences:  Outpatient Individual Therapy Statement of Needs:  Patient is to use CBT, mindfulness and coping skills to help manage and/or decrease symptoms associated with their diagnosis. Symptoms:  Depressed/Irritable mood, worry, social  withdrawal Problems Addressed:  Depressive thoughts, Sadness, Sleep issues, etc. Long Term Goals:  Pt to reduce overall level, frequency, and intensity of the feelings of depression as evidenced by decreased irritability, negative self talk, and helpless feelings from 6 to 7 days/week to 0 to 1 days/week, per client report, for at least 3 consecutive months.  Progress: 30% Short Term Goals:  Pt to verbally express understanding of the relationship between feelings of depression and their impact on thinking patterns and behaviors.  Pt to verbalize an understanding of the role that distorted thinking plays in creating fears, excessive  worry, and ruminations.  Progress: 30% Target Date:  11/23/2024 Frequency:  Monthly Modality:  Cognitive Behavioral Therapy Interventions by Therapist:  Therapist will use CBT, Mindfulness exercises, Coping skills and Referrals, as needed by client. Client has verbally approved this treatment plan.  Kendra Day, [facility]

## 2023-12-18 ENCOUNTER — Ambulatory Visit (INDEPENDENT_AMBULATORY_CARE_PROVIDER_SITE_OTHER): Payer: Managed Care, Other (non HMO) | Admitting: Family Medicine

## 2024-01-03 ENCOUNTER — Ambulatory Visit: Payer: 59 | Admitting: Psychology

## 2024-01-03 DIAGNOSIS — F4321 Adjustment disorder with depressed mood: Secondary | ICD-10-CM | POA: Diagnosis not present

## 2024-01-03 NOTE — Progress Notes (Signed)
[facility] Counselor/Therapist Progress Note  Patient ID: Kendra Day, MRN: [id number],    Date: 01/03/2024  Time Spent: 60 mins  start time: 1600   end time: 1700  Treatment Type: Individual Therapy  Reported Symptoms: Pt presented for follow up session, via Caregility video.  Pt grants consent for session, stating she is in her home with no one else present; pt states she understands the limits of virtual session.  I shared with pt that I am in my office with no one else present here either.    Mental Status Exam: Appearance:  Casual     Behavior: Appropriate  Motor: Normal  Speech/Language:  Clear and Coherent  Affect: Appropriate  Mood: normal  Thought process: normal  Thought content:   WNL  Sensory/Perceptual disturbances:   WNL  Orientation: oriented to person, place, and time/date  Attention: Good  Concentration: Good  Memory: WNL  Fund of knowledge:  Good  Insight:   Good  Judgment:  Good  Impulse Control: Good   Risk Assessment: Danger to Self:  No Self-injurious Behavior: No Danger to Others: No Duty to Warn:no Physical Aggression / Violence:No  Access to Firearms a concern: No  Gang Involvement:No   Subjective: Pt shares that "January has been pretty good.  We did have one customer, who does this kind of thing all the time, completely changed what they wanted for their benefits and we have to rework their info in less than 2 wks.  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  Pt shares she is not so worried for herself and her family related to the new political administration.  She is concerned for other people she knows.  Pt shares that [name] has started a new job at [employer] this week.  "We had another heated conversation with [name] last night but it did not get harsh, like it has in the past.  He stayed engaged and  did not get as angry as he usually does.  Pt shares that he told them that he is just different from other people and pt agrees with him on that.  He is now 53 yo.  [name] has heard [name] talking with a friend about looking for a place to live so they are trying to be patient with him since he just started a new job.  Pt and [name] are going to [facility] in March to see a band he likes; they are going to a music festival in [place] in April, and they are taking their [place] family vacation in May.  Pt has been trying to get back to the gym, she has been working on puzzles, she is still walking their dogs, listening to Audio books and being intentional about getting good sleep.  Encouraged pt to continue with her self care activities and we will meet in 3 wks for a follow up session.   Interventions: Cognitive Behavioral Therapy  Diagnosis:Adjustment disorder with depressed mood  Plan: Treatment Plan Strengths/Abilities:  Intelligent, Intuitive, Willing to participate in therapy Treatment Preferences:  Outpatient Individual Therapy Statement of Needs:  Patient is to use CBT, mindfulness and coping skills to help manage and/or decrease symptoms associated with their diagnosis. Symptoms:  Depressed/Irritable mood, worry, social withdrawal Problems Addressed:  Depressive thoughts, Sadness, Sleep issues, etc. Long Term Goals:  Pt to reduce overall  level, frequency, and intensity of the feelings of depression as evidenced by decreased irritability, negative self talk, and helpless feelings from 6 to 7 days/week to 0 to 1 days/week, per client report, for at least 3 consecutive months.  Progress: 30% Short Term Goals:  Pt to verbally express understanding of the relationship between feelings of depression and their impact on thinking patterns and behaviors.  Pt to verbalize an understanding of the role that distorted thinking plays in creating fears, excessive worry, and ruminations.  Progress: 30% Target  Date:  11/23/2024 Frequency:  Monthly Modality:  Cognitive Behavioral Therapy Interventions by Therapist:  Therapist will use CBT, Mindfulness exercises, Coping skills and Referrals, as needed by client. Client has verbally approved this treatment plan.  Karie Kirks, [facility]               Karie Kirks, [facility]

## 2024-01-06 ENCOUNTER — Other Ambulatory Visit: Payer: Self-pay | Admitting: Family Medicine

## 2024-01-06 DIAGNOSIS — E041 Nontoxic single thyroid nodule: Secondary | ICD-10-CM

## 2024-01-10 ENCOUNTER — Ambulatory Visit
Admission: RE | Admit: 2024-01-10 | Discharge: 2024-01-10 | Disposition: A | Discharge status: Home / Self Care | Payer: Managed Care, Other (non HMO) | Source: Ambulatory Visit | Attending: Family Medicine | Admitting: Family Medicine

## 2024-01-10 DIAGNOSIS — E041 Nontoxic single thyroid nodule: Secondary | ICD-10-CM

## 2024-01-15 ENCOUNTER — Ambulatory Visit (INDEPENDENT_AMBULATORY_CARE_PROVIDER_SITE_OTHER): Payer: Managed Care, Other (non HMO) | Admitting: Family Medicine

## 2024-01-24 ENCOUNTER — Ambulatory Visit: Payer: 59 | Admitting: Psychology

## 2024-01-24 DIAGNOSIS — F4321 Adjustment disorder with depressed mood: Secondary | ICD-10-CM

## 2024-01-24 NOTE — Progress Notes (Signed)
 [facility] Counselor/Therapist Progress Note  Patient ID: Kendra Day, MRN: [id number],    Date: 01/24/2024  Time Spent: 45 mins  start time: 1500   end time: 1545  Treatment Type: Individual Therapy  Reported Symptoms: Pt presented for follow up session, via Caregility video.  Pt grants consent for session, stating she is in her home with no one else present; pt states she understands the limits of virtual session.  I shared with pt that I am in my office with no one else present here either.    Mental Status Exam: Appearance:  Casual     Behavior: Appropriate  Motor: Normal  Speech/Language:  Clear and Coherent  Affect: Appropriate  Mood: normal  Thought process: normal  Thought content:   WNL  Sensory/Perceptual disturbances:   WNL  Orientation: oriented to person, place, and time/date  Attention: Good  Concentration: Good  Memory: WNL  Fund of knowledge:  Good  Insight:   Good  Judgment:  Good  Impulse Control: Good   Risk Assessment: Danger to Self:  No Self-injurious Behavior: No Danger to Others: No Duty to Warn:no Physical Aggression / Violence:No  Access to Firearms a concern: No  Gang Involvement:No   Subjective: Pt shares that "it has been really busy at work and I am having a stretch where nothing seems to be going right.  I have a particular group that I like a lot and we recently found an error that we did not correct 3 yrs ago; it has been a frustrating month."  Pt shares that [name] is doing well; [name] is good too; "[name] is living his best life at [employer]."  Pt shares that [name] got fired from [employer] because they said he stole something but he denies that he did it.  He is trying to get a job with a temp agency now.  Pt shares that she is trying to stay out of his job search because he is rarely satisfied with what they do.  Pt shares that [name]'s job has a new EAP benefit that allows for 30 minute legal consultation and they are  scheduled to meet with an attorney to talk about the process of eviction and what are the implications of that process for them.  She is hopeful that they can get answers that way.  Pt shares she has tried to be less involved consuming news in the past month because most of it is troubling.  Pt and [name] are going to [facility] in March to see the [place] and she is looking forward to that.  Pt shares that she and [name] continue to "puzzle" and they enjoy that; they are also watching favorite TV shows.  She has been trying to walk as well, when the weather will allow.  She is still walking their dogs, listening to Audio books and being intentional about getting good sleep.  Encouraged pt to continue with her self care activities and we will meet in 3 wks for a follow up session.   Interventions: Cognitive Behavioral Therapy  Diagnosis:Adjustment disorder with depressed mood  Plan: Treatment Plan Strengths/Abilities:  Intelligent, Intuitive, Willing to participate in therapy Treatment Preferences:  Outpatient Individual Therapy Statement of Needs:  Patient is to use CBT, mindfulness and coping skills to help manage and/or decrease symptoms associated with their diagnosis. Symptoms:  Depressed/Irritable mood, worry, social withdrawal Problems Addressed:  Depressive thoughts, Sadness, Sleep issues, etc. Long Term Goals:  Pt to reduce overall level, frequency,  and intensity of the feelings of depression as evidenced by decreased irritability, negative self talk, and helpless feelings from 6 to 7 days/week to 0 to 1 days/week, per client report, for at least 3 consecutive months.  Progress: 30% Short Term Goals:  Pt to verbally express understanding of the relationship between feelings of depression and their impact on thinking patterns and behaviors.  Pt to verbalize an understanding of the role that distorted thinking plays in creating fears, excessive worry, and ruminations.  Progress: 30% Target Date:   11/23/2024 Frequency:  Monthly Modality:  Cognitive Behavioral Therapy Interventions by Therapist:  Therapist will use CBT, Mindfulness exercises, Coping skills and Referrals, as needed by client. Client has verbally approved this treatment plan.  Karie Kirks, [facility]

## 2024-01-24 NOTE — Addendum Note (Signed)
 Addended by: Maggie Font B on: 01/24/2024 03:50 PM   Modules accepted: Level of Service

## 2024-02-06 ENCOUNTER — Encounter (INDEPENDENT_AMBULATORY_CARE_PROVIDER_SITE_OTHER): Payer: Self-pay

## 2024-02-12 ENCOUNTER — Other Ambulatory Visit: Payer: Self-pay | Admitting: Surgery

## 2024-02-12 DIAGNOSIS — E041 Nontoxic single thyroid nodule: Secondary | ICD-10-CM

## 2024-02-14 ENCOUNTER — Ambulatory Visit: Payer: 59 | Admitting: Psychology

## 2024-02-14 DIAGNOSIS — F4321 Adjustment disorder with depressed mood: Secondary | ICD-10-CM | POA: Diagnosis not present

## 2024-02-14 NOTE — Progress Notes (Signed)
 [facility] Counselor/Therapist Progress Note  Patient ID: Kendra Day, MRN: [id number],    Date: 02/14/2024  Time Spent: 45 mins  start time: 1500   end time: 1545  Treatment Type: Individual Therapy  Reported Symptoms: Pt presented for follow up session, via Caregility video.  Pt grants consent for session, stating she is in her home with no one else present; pt states she understands the limits of virtual session.  I shared with pt that I am in my office with no one else present here either.    Mental Status Exam: Appearance:  Casual     Behavior: Appropriate  Motor: Normal  Speech/Language:  Clear and Coherent  Affect: Appropriate  Mood: normal  Thought process: normal  Thought content:   WNL  Sensory/Perceptual disturbances:   WNL  Orientation: oriented to person, place, and time/date  Attention: Good  Concentration: Good  Memory: WNL  Fund of knowledge:  Good  Insight:   Good  Judgment:  Good  Impulse Control: Good   Risk Assessment: Danger to Self:  No Self-injurious Behavior: No Danger to Others: No Duty to Warn:no Physical Aggression / Violence:No  Access to Firearms a concern: No  Gang Involvement:No   Subjective: Pt shares that "it has been slowing down a bit at work and that has been good.  [name] and his girlfriend came to visit for three days over the past weekend and we had a great visit.  They have gone to [place] for their Spring Break this week."  Pt shares that she and [name] have been talking about their move from their old neighborhood to their current neighborhood and how it may or may not have effected [name].  They have decided that [name] would be [name] no matter where they live.  Pt and [name] have continued to expect [name] to move out soon and he continues to put roadblocks up for that process; he says he wants to move to [place] in the summer.  Pt shares she has had work impacting her sleep lately; the past couple of nights she has  slept through the night.  Pt shares that she and [name] have talked about when they want to retire; they both anticipate working non-professional jobs.  They are both becoming tired of [name] still being there and not currently having a job and sleeping late most days.  [name] has been talking about changing his name to a version of his last name he had when they adopted him.  Pt is trying to be understanding of [name] with this issue but is not willing to help him with this.  Pt and [name] have been intentional about engaging in detachment where [name] is concerned as a means of not getting their feelings hurt.  Pt shares that [name] and pt will go to [place] to enjoy shows and dinners at the end of March; after they get back, they will set plans in place for [name] to get out of the home and on to his next adventure.  They have a few music festivals coming up in April, May, June and July.  Pt shares that [name]'s job has a new EAP benefit that allows for 30 minute legal consultation and they are scheduled to meet with an attorney to talk about the process of eviction and what are the implications of that process for them.  She is hopeful that they can get answers that way.  Pt shares that she and [name] continue to "puzzle" and  they enjoy that; they are also watching favorite TV shows.  She has been trying to walk as well, when the weather will allow.  She is still walking their dogs, listening to Audio books and being intentional about getting good sleep.  Encouraged pt to continue with her self care activities and we will meet in 6 wks for a follow up session, due to my vacation.   Interventions: Cognitive Behavioral Therapy  Diagnosis:Adjustment disorder with depressed mood  Plan: Treatment Plan Strengths/Abilities:  Intelligent, Intuitive, Willing to participate in therapy Treatment Preferences:  Outpatient Individual Therapy Statement of Needs:  Patient is to use CBT, mindfulness and coping skills to help manage and/or  decrease symptoms associated with their diagnosis. Symptoms:  Depressed/Irritable mood, worry, social withdrawal Problems Addressed:  Depressive thoughts, Sadness, Sleep issues, etc. Long Term Goals:  Pt to reduce overall level, frequency, and intensity of the feelings of depression as evidenced by decreased irritability, negative self talk, and helpless feelings from 6 to 7 days/week to 0 to 1 days/week, per client report, for at least 3 consecutive months.  Progress: 30% Short Term Goals:  Pt to verbally express understanding of the relationship between feelings of depression and their impact on thinking patterns and behaviors.  Pt to verbalize an understanding of the role that distorted thinking plays in creating fears, excessive worry, and ruminations.  Progress: 30% Target Date:  11/23/2024 Frequency:  Monthly Modality:  Cognitive Behavioral Therapy Interventions by Therapist:  Therapist will use CBT, Mindfulness exercises, Coping skills and Referrals, as needed by client. Client has verbally approved this treatment plan.  Karie Kirks, [facility]

## 2024-03-20 ENCOUNTER — Ambulatory Visit
Admission: RE | Admit: 2024-03-20 | Discharge: 2024-03-20 | Disposition: A | Discharge status: Home / Self Care | Source: Ambulatory Visit | Attending: Surgery | Admitting: Surgery

## 2024-03-20 ENCOUNTER — Other Ambulatory Visit (HOSPITAL_COMMUNITY)
Admission: RE | Admit: 2024-03-20 | Discharge: 2024-03-20 | Disposition: A | Discharge status: Home / Self Care | Source: Ambulatory Visit | Attending: Interventional Radiology | Admitting: Interventional Radiology

## 2024-03-20 DIAGNOSIS — E041 Nontoxic single thyroid nodule: Secondary | ICD-10-CM | POA: Diagnosis present

## 2024-03-24 LAB — CYTOLOGY - NON PAP

## 2024-03-25 ENCOUNTER — Ambulatory Visit: Admitting: Psychology

## 2024-03-30 ENCOUNTER — Encounter: Payer: Self-pay | Admitting: Surgery

## 2024-03-30 NOTE — Progress Notes (Signed)
 Good news!  Both biopsies are benign.  Will plan repeat USN with TSH level in one year, followed by exam in the office.  Loetta Ringer - please arranged.  Oralee Billow, MD [facility] practice Office: [phone]

## 2024-04-08 ENCOUNTER — Ambulatory Visit (INDEPENDENT_AMBULATORY_CARE_PROVIDER_SITE_OTHER): Admitting: Psychology

## 2024-04-08 DIAGNOSIS — F4321 Adjustment disorder with depressed mood: Secondary | ICD-10-CM

## 2024-04-08 NOTE — Progress Notes (Signed)
 [facility] Counselor/Therapist Progress Note  Patient ID: Kendra Day, MRN: [id number],    Date: 04/08/2024  Time Spent: 45 mins  start time: 0800   end time: 0900  Treatment Type: Individual Therapy  Reported Symptoms: Pt presented for follow up session, via Caregility video.  Pt grants consent for session, stating she is in her office with no one else present; pt states she understands the limits of virtual session.  I shared with pt that I am in my office with no one else present here either.    Mental Status Exam: Appearance:  Casual     Behavior: Appropriate  Motor: Normal  Speech/Language:  Clear and Coherent  Affect: Appropriate  Mood: normal  Thought process: normal  Thought content:   WNL  Sensory/Perceptual disturbances:   WNL  Orientation: oriented to person, place, and time/date  Attention: Good  Concentration: Good  Memory: WNL  Fund of knowledge:  Good  Insight:   Good  Judgment:  Good  Impulse Control: Good   Risk Assessment: Danger to Self:  No Self-injurious Behavior: No Danger to Others: No Duty to Warn:no Physical Aggression / Violence:No  Access to Firearms a concern: No  Gang Involvement:No   Subjective: Pt shares that "it has been a crazy month and a half.  We have taken a couple of trips ([place] and [place]) and those were good.  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  This is his 3rd week and he is working 4 days per week and it is an adjustment for him to get up and go to work each day."  Pt shares they are trying to be accommodating for him during this time but he is continuing to be demanding (wants a new car; currently driving a [address]).  Kendra Day has told them he is not leaving until "we have righted all of the wrongs that Kendra Day perceives we have done to him over his life.  Kendra Day continues to be so unreasonable."  Talked with pt about the likelihood that Kendra Day is putting up these  roadblocks to keep from having to move out of their home because they are meeting all of his needs.  Pt shares that Kendra Day will be coming home next week for the Summer; he will likely be working for [employer] again this Summer.  They are going to [place] this weekend to pack him up and bring most of his stuff back.  Kendra Day has 11 more classes to complete; he will go part-time next semester because of the sequencing of his classes.  Pt shares that Kendra Day has been enjoying not having Kendra Day at home during the day all day for the past 3 wks.  A lot of [name]'s friends are graduating from college this weekend and he is beginning to realize that they are not wanting roommates next year.  Pt and Kendra Day still want Kendra Day out of the house by the Fall of this year; they still do not know how that process will work from a Forensic scientist.  They are continuing to search for answers to those questions.  Pt shares that she and Kendra Day had a mouse problem in their home and they had to buy a new stove so they have had to sell some of their tickets for other activities they had planned this Spring.   Kendra Day has still been talking about changing his name to a version of his last name he  had when they adopted him; has to have his arrest record exsponged first.  .  Pt shares that she and Kendra Day continue to "puzzle" and they enjoy that; they are also watching favorite TV shows.  She has been trying to walk as well, when the weather will allow.  She is still walking their dogs, listening to Audio books and being intentional about getting good sleep.  Encouraged pt to continue with her self care activities and we will meet in 3 wks for a follow up session.   Interventions: Cognitive Behavioral Therapy  Diagnosis:Adjustment disorder with depressed mood  Plan: Treatment Plan Strengths/Abilities:  Intelligent, Intuitive, Willing to participate in therapy Treatment Preferences:  Outpatient Individual Therapy Statement of Needs:  Patient is to use  CBT, mindfulness and coping skills to help manage and/or decrease symptoms associated with their diagnosis. Symptoms:  Depressed/Irritable mood, worry, social withdrawal Problems Addressed:  Depressive thoughts, Sadness, Sleep issues, etc. Long Term Goals:  Pt to reduce overall level, frequency, and intensity of the feelings of depression as evidenced by decreased irritability, negative self talk, and helpless feelings from 6 to 7 days/week to 0 to 1 days/week, per client report, for at least 3 consecutive months.  Progress: 30% Short Term Goals:  Pt to verbally express understanding of the relationship between feelings of depression and their impact on thinking patterns and behaviors.  Pt to verbalize an understanding of the role that distorted thinking plays in creating fears, excessive worry, and ruminations.  Progress: 30% Target Date:  11/23/2024 Frequency:  Monthly Modality:  Cognitive Behavioral Therapy Interventions by Therapist:  Therapist will use CBT, Mindfulness exercises, Coping skills and Referrals, as needed by client. Client has verbally approved this treatment plan.  Jhonny Moss, [facility]

## 2024-05-01 ENCOUNTER — Ambulatory Visit: Admitting: Psychology

## 2024-05-01 DIAGNOSIS — F4321 Adjustment disorder with depressed mood: Secondary | ICD-10-CM
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
## 2024-05-22 ENCOUNTER — Ambulatory Visit: Admitting: Psychology

## 2024-05-22 DIAGNOSIS — F4321 Adjustment disorder with depressed mood: Secondary | ICD-10-CM | POA: Diagnosis not present

## 2024-05-22 NOTE — Progress Notes (Signed)
 [facility] Counselor/Therapist Progress Note  Patient ID: Kendra Day, MRN: [id number],    Date: 05/22/2024  Time Spent: 60 mins  start time: 1100   end time: 1200  Treatment Type: Individual Therapy  Reported Symptoms: Pt presented for follow up session, via Caregility video.  Pt grants consent for session, stating she is in her home with no one else present; pt states she understands the limits of virtual session.  I shared with pt that I am in my office with no one else present here either.    Mental Status Exam: Appearance:  Casual     Behavior: Appropriate  Motor: Normal  Speech/Language:  Clear and Coherent  Affect: Appropriate  Mood: normal  Thought process: normal  Thought content:   WNL  Sensory/Perceptual disturbances:   WNL  Orientation: oriented to person, place, and time/date  Attention: Good  Concentration: Good  Memory: WNL  Fund of knowledge:  Good  Insight:   Good  Judgment:  Good  Impulse Control: Good   Risk Assessment: Danger to Self:  No Self-injurious Behavior: No Danger to Others: No Duty to Warn:no Physical Aggression / Violence:No  Access to Firearms a concern: No  Gang Involvement:No   Subjective: Pt shares that it has been about the same around here.  We have given Kendra Day his second letter regarding him having to move out.  He has been, again, focused on us  having taken a pair of his shorts and he even accused me of having made his Crocs more narrow, as though I have the ability to do that.  These situations are very upsetting for pt and Kendra Day.  Kendra Day has not gotten another job yet although pt is aware that he has been applying for jobs.  Pt shares that Kendra Day continues to experience anxious episodes; he will sometimes admit to being anxious but always says he does not want to be medicated.  Pt shares that she knows that Kendra Day is being influenced by [employer] messages and he is allowing himself to be heavily influenced by the messages.   Pt shares that Kendra Day is doing OK and has continued to be a buffer between [place] and pt; pt still has Kendra Day blocked on her phone and social media.  Kendra Day has become more frustrated with Kendra Day and about the situation; he has a group of friends and support people that he can talk to regularly.  Pt shares that, if Kendra Day has not made strides toward choosing to move out, they will file the eviction paperwork and may have to get a restraining order to get him out of the house.  Pt still worries about Kendra Day when he is not at home (i.e., he went to [place] last week with friends overnight).  Pt continues to engage in self care activities to help them deal with the stress of these issues with Kendra Day.  Encouraged pt to continue to talk with Kendra Day about what they might have to feel if they are forced to go through with the eviction process for Kendra Day.  Pt shares that she and Kendra Day continue to puzzle and they enjoy that; they are also watching favorite TV shows.  She has been trying to walk as well, when the weather will allow.  She is still walking their dogs, listening to Audio books and being intentional about getting good sleep.  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  Encouraged pt to continue with her self care activities and we will meet in 3 wks for a follow up session.   Interventions: Cognitive Behavioral Therapy  Diagnosis:Adjustment disorder with depressed mood  Plan: Treatment Plan Strengths/Abilities:  Intelligent, Intuitive, Willing to participate in therapy Treatment Preferences:  Outpatient Individual Therapy Statement of Needs:  Patient is to use CBT, mindfulness and coping skills to help manage and/or decrease symptoms associated with their diagnosis. Symptoms:  Depressed/Irritable mood, worry, social withdrawal Problems Addressed:  Depressive thoughts, Sadness, Sleep issues, etc. Long Term  Goals:  Pt to reduce overall level, frequency, and intensity of the feelings of depression as evidenced by decreased irritability, negative self talk, and helpless feelings from 6 to 7 days/week to 0 to 1 days/week, per client report, for at least 3 consecutive months.  Progress: 30% Short Term Goals:  Pt to verbally express understanding of the relationship between feelings of depression and their impact on thinking patterns and behaviors.  Pt to verbalize an understanding of the role that distorted thinking plays in creating fears, excessive worry, and ruminations.  Progress: 30% Target Date:  11/23/2024 Frequency:  Monthly Modality:  Cognitive Behavioral Therapy Interventions by Therapist:  Therapist will use CBT, Mindfulness exercises, Coping skills and Referrals, as needed by client. Client has verbally approved this treatment plan.  Jhonny Moss, [facility]

## 2024-06-12 ENCOUNTER — Ambulatory Visit (INDEPENDENT_AMBULATORY_CARE_PROVIDER_SITE_OTHER): Admitting: Psychology

## 2024-06-12 DIAGNOSIS — F4321 Adjustment disorder with depressed mood: Secondary | ICD-10-CM

## 2024-06-12 NOTE — Progress Notes (Signed)
 [facility] Counselor/Therapist Progress Note  Patient ID: Kendra Day, MRN: [id number],    Date: 06/12/2024  Time Spent: 55 mins  start time: 1400   end time: 1455  Treatment Type: Individual Therapy  Reported Symptoms: Pt presented for follow up session, via Caregility video.  Pt grants consent for session, stating she is in her home with no one else present; pt states she understands the limits of virtual session.  I shared with pt that I am in my office with no one else present here either.    Mental Status Exam: Appearance:  Casual     Behavior: Appropriate  Motor: Normal  Speech/Language:  Clear and Coherent  Affect: Appropriate  Mood: normal  Thought process: normal  Thought content:   WNL  Sensory/Perceptual disturbances:   WNL  Orientation: oriented to person, place, and time/date  Attention: Good  Concentration: Good  Memory: WNL  Fund of knowledge:  Good  Insight:   Good  Judgment:  Good  Impulse Control: Good   Risk Assessment: Danger to Self:  No Self-injurious Behavior: No Danger to Others: No Duty to Warn:no Physical Aggression / Violence:No  Access to Firearms a concern: No  Gang Involvement:No   Subjective: Pt shares that I have been pretty good since our last session.  We had a lazy 4th of July and celebrated Thomas's birthday.  We went to [place] to see his sister on Saturday and his sister continues to decline.  Her language is quite effected now.  They are thinking that she might need to be in a nursing home at this point.  Talked about ways to help the adult kids think about the possibility of placing their mom in a facility.  Pt shares that we actually were able to have a productive conversation with Gordy this morning.  He has re-enrolled at [employer] (applied by himself) and he completed his FAFSA and asked us  to complete our portion of the form, which we will do.  He is planning to find an apt in [place] and plans to be there  before 8/17 when classes start.  We will take any win when we can get it.  Pt is concerned that Gordy may have some issue with his thyroid ; he had a physical and will be getting blood work done next week.  Pt shares that she and Lamar continue to puzzle and they enjoy that; they are also watching favorite TV shows.  She has been trying to walk as well, when the weather will allow.  She is still walking their dogs, listening to Audio books and being intentional about getting good sleep.  Encouraged pt to continue with her self care activities and we will meet in 3 wks for a follow up session.   Interventions: Cognitive Behavioral Therapy  Diagnosis:Adjustment disorder with depressed mood  Plan: Treatment Plan Strengths/Abilities:  Intelligent, Intuitive, Willing to participate in therapy Treatment Preferences:  Outpatient Individual Therapy Statement of Needs:  Patient is to use CBT, mindfulness and coping skills to help manage and/or decrease symptoms associated with their diagnosis. Symptoms:  Depressed/Irritable mood, worry, social withdrawal Problems Addressed:  Depressive thoughts, Sadness, Sleep issues, etc. Long Term Goals:  Pt to reduce overall level, frequency, and intensity of the feelings of depression as evidenced by decreased irritability, negative self talk, and helpless feelings from 6 to 7 days/week to 0 to 1 days/week, per client report, for at least 3 consecutive months.  Progress: 30% Short Term Goals:  Pt to verbally express understanding of the relationship between feelings of depression and their impact on thinking patterns and behaviors.  Pt to verbalize an understanding of the role that distorted thinking plays in creating fears, excessive worry, and ruminations.  Progress: 30% Target Date:  11/23/2024 Frequency:  Monthly Modality:  Cognitive Behavioral Therapy Interventions by Therapist:  Therapist will use CBT, Mindfulness exercises, Coping skills and Referrals, as needed  by client. Client has verbally approved this treatment plan.  Francis KATHEE Macintosh, [facility]

## 2024-07-10 ENCOUNTER — Ambulatory Visit: Admitting: Psychology

## 2024-07-10 DIAGNOSIS — F4321 Adjustment disorder with depressed mood: Secondary | ICD-10-CM

## 2024-07-10 NOTE — Progress Notes (Signed)
 [facility] Counselor/Therapist Progress Note  Patient ID: Kendra Day, MRN: [id number],    Date: 07/10/2024  Time Spent: 55 mins  start time: 1500   end time: 1555  Treatment Type: Individual Therapy  Reported Symptoms: Pt presented for follow up session, via Caregility video.  Pt grants consent for session, stating she is in her home with no one else present; pt states she understands the limits of virtual session.  I shared with pt that I am in my office with no one else present here either.    Mental Status Exam: Appearance:  Casual     Behavior: Appropriate  Motor: Normal  Speech/Language:  Clear and Coherent  Affect: Appropriate  Mood: normal  Thought process: normal  Thought content:   WNL  Sensory/Perceptual disturbances:   WNL  Orientation: oriented to person, place, and time/date  Attention: Good  Concentration: Good  Memory: WNL  Fund of knowledge:  Good  Insight:   Good  Judgment:  Good  Impulse Control: Good   Risk Assessment: Danger to Self:  No Self-injurious Behavior: No Danger to Others: No Duty to Warn:no Physical Aggression / Violence:No  Access to Firearms a concern: No  Gang Involvement:No   Subjective: Pt shares that I have been pretty good since our last session.  As predicted, things with Gordy were going pretty well and they then went sideways.  He started acting out when we were in the midst getting Debby ready to move back to [place]; we moved [place] back Wed and [employer].  Gordy allegedly has found a room mate and they are looking for a place to live in [place].  He has found out that he is not eligible for financial aid because he has not actively been in school.  He is trying to register for classes but is having trouble doing that.  He does not want to take science, math, or English classes.  Pt shares that Gordy continues to be stuck.  Suggested pt might benefit from EMDR and he could research the topic and find his own  therapist that he felt good about.  Pt will mention this to him to see what he thinks.  Pt is hopeful that Gordy and his potential room mate will find a good place to live.  Pt shares that work has been slow the past couple of weeks and she knows it will pick up in the Fall.  Pt shares that [name]'s sister's caregiver quit because of conflict with [name]'s niece; his sister has a neurological disorder and is bedridden and her husband passed away unexpectedly last year.  Gordy did not have his physical or his blood work last month and pt was not surprised by that decision from him.  Pt shares she has not been doing enough that is good for her; she was experiencing anxiety when they were moving [place] and she has significant concerns for Gordy and his move to [place].  Pt shares that she and Lamar continue to puzzle and they enjoy that; they are also watching favorite TV shows.  She has been trying to walk as well, when the weather will allow.  She is still walking their dogs, listening to Audio books and being intentional about getting good sleep.  Encouraged pt to continue with her self care activities and we will meet in 3 wks for a follow up session.   Interventions: Cognitive Behavioral Therapy  Diagnosis:Adjustment disorder with depressed mood  Plan: Treatment Plan Strengths/Abilities:  Intelligent,  Intuitive, Willing to participate in therapy Treatment Preferences:  Outpatient Individual Therapy Statement of Needs:  Patient is to use CBT, mindfulness and coping skills to help manage and/or decrease symptoms associated with their diagnosis. Symptoms:  Depressed/Irritable mood, worry, social withdrawal Problems Addressed:  Depressive thoughts, Sadness, Sleep issues, etc. Long Term Goals:  Pt to reduce overall level, frequency, and intensity of the feelings of depression as evidenced by decreased irritability, negative self talk, and helpless feelings from 6 to 7 days/week to 0 to 1 days/week, per client  report, for at least 3 consecutive months.  Progress: 30% Short Term Goals:  Pt to verbally express understanding of the relationship between feelings of depression and their impact on thinking patterns and behaviors.  Pt to verbalize an understanding of the role that distorted thinking plays in creating fears, excessive worry, and ruminations.  Progress: 30% Target Date:  11/23/2024 Frequency:  Monthly Modality:  Cognitive Behavioral Therapy Interventions by Therapist:  Therapist will use CBT, Mindfulness exercises, Coping skills and Referrals, as needed by client. Client has verbally approved this treatment plan.  Francis KATHEE Macintosh, [facility]

## 2024-07-31 ENCOUNTER — Ambulatory Visit: Admitting: Psychology

## 2024-07-31 DIAGNOSIS — F4321 Adjustment disorder with depressed mood: Secondary | ICD-10-CM

## 2024-07-31 NOTE — Progress Notes (Signed)
 [facility] Counselor/Therapist Progress Note  Patient ID: Kendra Day, MRN: [id number],    Date: 07/31/2024  Time Spent: 54 mins  start time: 1500   end time: 1554  Treatment Type: Individual Therapy  Reported Symptoms: Pt presented for follow up session, via Caregility video.  Pt grants consent for session, stating she is in her home with no one else present; pt states she understands the limits of virtual session.  I shared with pt that I am in my office with no one else present here either.    Mental Status Exam: Appearance:  Casual     Behavior: Appropriate  Motor: Normal  Speech/Language:  Clear and Coherent  Affect: Appropriate  Mood: normal  Thought process: normal  Thought content:   WNL  Sensory/Perceptual disturbances:   WNL  Orientation: oriented to person, place, and time/date  Attention: Good  Concentration: Good  Memory: WNL  Fund of knowledge:  Good  Insight:   Good  Judgment:  Good  Impulse Control: Good   Risk Assessment: Danger to Self:  No Self-injurious Behavior: No Danger to Others: No Duty to Warn:no Physical Aggression / Violence:No  Access to Firearms a concern: No  Gang Involvement:No   Subjective: Pt shares that I have been pretty good since our last session.  Debby is back at [employer] for the [employer]; he is only taking 3 classes this semester due to sequencing and that is fine.  He is glad to be back up there.  Gordy is at the gym right now and we are planning to file the eviction paperwork this afternoon before 5pm.  Lamar and I are on the same page with it.  Pt shares that she went to the beach last weekend with two women who also adopted children.  They have some issues as well with their kids; it helped pt not feel so alone.  Gordy has not said anything else about moving to [place] with his friend.  Pt shares that Lamar went to see his sister last evening and she is continuing to decline with her health.  Pt has  suggested to Lamar that a family meeting is necessary with all of his sister's kids to talk about what might come next.  Pt shares that work continues to be slow but she knows it will be picking up soon.  Pt reminds me of her beach trip; they played [employer] and she enjoys learning how to play; she continues to walk as she can with the dogs and listens to Audio books.  Encouraged pt to continue with her self care activities and we will meet in 4 wks for a follow up session, due to my vacation.   Interventions: Cognitive Behavioral Therapy  Diagnosis:Adjustment disorder with depressed mood  Plan: Treatment Plan Strengths/Abilities:  Intelligent, Intuitive, Willing to participate in therapy Treatment Preferences:  Outpatient Individual Therapy Statement of Needs:  Patient is to use CBT, mindfulness and coping skills to help manage and/or decrease symptoms associated with their diagnosis. Symptoms:  Depressed/Irritable mood, worry, social withdrawal Problems Addressed:  Depressive thoughts, Sadness, Sleep issues, etc. Long Term Goals:  Pt to reduce overall level, frequency, and intensity of the feelings of depression as evidenced by decreased irritability, negative self talk, and helpless feelings from 6 to 7 days/week to 0 to 1 days/week, per client report, for at least 3 consecutive months.  Progress: 30% Short Term Goals:  Pt to verbally express understanding of the relationship between feelings of depression  and their impact on thinking patterns and behaviors.  Pt to verbalize an understanding of the role that distorted thinking plays in creating fears, excessive worry, and ruminations.  Progress: 30% Target Date:  11/23/2024 Frequency:  Monthly Modality:  Cognitive Behavioral Therapy Interventions by Therapist:  Therapist will use CBT, Mindfulness exercises, Coping skills and Referrals, as needed by client. Client has verbally approved this treatment plan.  Francis KATHEE Macintosh, [facility]

## 2024-08-14 ENCOUNTER — Other Ambulatory Visit: Payer: Self-pay

## 2024-08-14 ENCOUNTER — Other Ambulatory Visit: Payer: Self-pay | Admitting: Obstetrics and Gynecology

## 2024-08-14 DIAGNOSIS — Z1231 Encounter for screening mammogram for malignant neoplasm of breast: Secondary | ICD-10-CM

## 2024-08-28 ENCOUNTER — Ambulatory Visit: Admitting: Psychology

## 2024-09-03 ENCOUNTER — Ambulatory Visit (INDEPENDENT_AMBULATORY_CARE_PROVIDER_SITE_OTHER): Admitting: Psychology

## 2024-09-03 DIAGNOSIS — F4321 Adjustment disorder with depressed mood: Secondary | ICD-10-CM | POA: Diagnosis not present

## 2024-09-03 NOTE — Progress Notes (Signed)
 [facility] Counselor/Therapist Progress Note  Patient ID: Kendra Day, MRN: [id number],    Date: 09/03/2024  Time Spent: 53 mins  start time: 1300   end time: 1353  Treatment Type: Individual Therapy  Reported Symptoms: Pt presented for follow up session, via Caregility video.  Pt grants consent for session, stating she is in her home with no one else present; pt states she understands the limits of virtual session.  I shared with pt that I am in my office with no one else present here either.    Mental Status Exam: Appearance:  Casual     Behavior: Appropriate  Motor: Normal  Speech/Language:  Clear and Coherent  Affect: Appropriate  Mood: normal  Thought process: normal  Thought content:   WNL  Sensory/Perceptual disturbances:   WNL  Orientation: oriented to person, place, and time/date  Attention: Good  Concentration: Good  Memory: WNL  Fund of knowledge:  Good  Insight:   Good  Judgment:  Good  Impulse Control: Good   Risk Assessment: Danger to Self:  No Self-injurious Behavior: No Danger to Others: No Duty to Warn:no Physical Aggression / Violence:No  Access to Firearms a concern: No  Gang Involvement:No   Subjective: Pt shares that I have been pretty good since our last session.  The last couple of weeks have been 0-60 at work.  We have filed eviction paperwork for Gordy and we have told him that if he can get this figured out before the court date, we would cancel the court date.  The court date was this past Tuesday and he had plans to move to [place] to a specific apt with a room mate.  Those plans fell through but we have given him until Monday to have a place or we will file the eviction paperwork again.  We are set on having him out be the end of the month.  Pt and Lamar are going to [place] for this weekend to see a football game.  Pt shares that Gordy went to pt's mom when they filed the eviction paperwork and told her all manner of  negative things about pt.  Pt's mom is trying to be supportive of pt and of Gordy.  [name]'s sister is continuing to decline rapidly and that is sad for pt, Lamar and their nieces and nephews.  Pt shares that they have a colleague in the office who is making several mistakes and no one is holding her accountable for the mistakes.  This is frustrating for pt and her other colleagues.  Pt shares they continue to watch favorite TV shows, going to football games, having dinner with friends, she continues to walk the dogs and she has been listening to books in the car, etc.  Encouraged pt to continue with her self care activities and we will meet in 3 wks for a follow up session.   Interventions: Cognitive Behavioral Therapy  Diagnosis:Adjustment disorder with depressed mood  Plan: Treatment Plan Strengths/Abilities:  Intelligent, Intuitive, Willing to participate in therapy Treatment Preferences:  Outpatient Individual Therapy Statement of Needs:  Patient is to use CBT, mindfulness and coping skills to help manage and/or decrease symptoms associated with their diagnosis. Symptoms:  Depressed/Irritable mood, worry, social withdrawal Problems Addressed:  Depressive thoughts, Sadness, Sleep issues, etc. Long Term Goals:  Pt to reduce overall level, frequency, and intensity of the feelings of depression as evidenced by decreased irritability, negative self talk, and helpless feelings from 6 to 7 days/week to  0 to 1 days/week, per client report, for at least 3 consecutive months.  Progress: 30% Short Term Goals:  Pt to verbally express understanding of the relationship between feelings of depression and their impact on thinking patterns and behaviors.  Pt to verbalize an understanding of the role that distorted thinking plays in creating fears, excessive worry, and ruminations.  Progress: 30% Target Date:  11/23/2024 Frequency:  Monthly Modality:  Cognitive Behavioral Therapy Interventions by Therapist:   Therapist will use CBT, Mindfulness exercises, Coping skills and Referrals, as needed by client. Client has verbally approved this treatment plan.  Francis KATHEE Macintosh, [facility]

## 2024-09-08 ENCOUNTER — Ambulatory Visit
Admission: RE | Admit: 2024-09-08 | Discharge: 2024-09-08 | Disposition: A | Discharge status: Home / Self Care | Source: Ambulatory Visit

## 2024-09-08 DIAGNOSIS — Z1231 Encounter for screening mammogram for malignant neoplasm of breast: Secondary | ICD-10-CM

## 2024-09-24 ENCOUNTER — Ambulatory Visit: Admitting: Psychology

## 2024-10-02 ENCOUNTER — Ambulatory Visit: Admitting: Psychology

## 2024-10-02 DIAGNOSIS — F4321 Adjustment disorder with depressed mood: Secondary | ICD-10-CM | POA: Diagnosis not present

## 2024-10-02 NOTE — Progress Notes (Signed)
 [facility] Counselor/Therapist Progress Note  Patient ID: Kendra Day, MRN: [id number],    Date: 10/02/2024  Time Spent: 57 mins  start time: 1500   end time: 1557  Treatment Type: Individual Therapy  Reported Symptoms: Pt presented for follow up session, via Caregility video.  Pt grants consent for session, stating she is in her home with no one else present; pt states she understands the limits of virtual session.  I shared with pt that I am in my office with no one else present here either.    Mental Status Exam: Appearance:  Casual     Behavior: Appropriate  Motor: Normal  Speech/Language:  Clear and Coherent  Affect: Appropriate  Mood: normal  Thought process: normal  Thought content:   WNL  Sensory/Perceptual disturbances:   WNL  Orientation: oriented to person, place, and time/date  Attention: Good  Concentration: Good  Memory: WNL  Fund of knowledge:  Good  Insight:   Good  Judgment:  Good  Impulse Control: Good   Risk Assessment: Danger to Self:  No Self-injurious Behavior: No Danger to Others: No Duty to Warn:no Physical Aggression / Violence:No  Access to Firearms a concern: No  Gang Involvement:No   Subjective: Pt shares that I have been pretty good since our last session.  Today, Lamar is in [place] with a [employer] containing all of [name]'s stuff and Gordy has just shown up there to move into a one bedroom apt.  Pt shares that Gordy has several thousand dollars in his account to live on for a while until he finds a job in [place].  Pt shares that Lamar has been ready for him to live somewhere else for a while and pt is supportive of the idea.  Pt shares they are going to [place] for the football game.  Pt has also been busy with work for the past month or so and will be so for the next two months.  Pt and Lamar have been continuing to puzzle and they have been out of town with family as well.  Pt shares that [name]'s sister's children are  working on plans for moving her to a facility, probably around early Dec.       Pt shares they continue to watch favorite TV shows, going to football games, having dinner with friends, she continues to walk the dogs and she has been listening to books in the car, etc.  Encouraged pt to continue with her self care activities and we will meet in 3 wks for a follow up session.   Interventions: Cognitive Behavioral Therapy  Diagnosis:Adjustment disorder with depressed mood  Plan: Treatment Plan Strengths/Abilities:  Intelligent, Intuitive, Willing to participate in therapy Treatment Preferences:  Outpatient Individual Therapy Statement of Needs:  Patient is to use CBT, mindfulness and coping skills to help manage and/or decrease symptoms associated with their diagnosis. Symptoms:  Depressed/Irritable mood, worry, social withdrawal Problems Addressed:  Depressive thoughts, Sadness, Sleep issues, etc. Long Term Goals:  Pt to reduce overall level, frequency, and intensity of the feelings of depression as evidenced by decreased irritability, negative self talk, and helpless feelings from 6 to 7 days/week to 0 to 1 days/week, per client report, for at least 3 consecutive months.  Progress: 30% Short Term Goals:  Pt to verbally express understanding of the relationship between feelings of depression and their impact on thinking patterns and behaviors.  Pt to verbalize an understanding of the role that distorted thinking plays in creating fears,  excessive worry, and ruminations.  Progress: 30% Target Date:  11/23/2024 Frequency:  Monthly Modality:  Cognitive Behavioral Therapy Interventions by Therapist:  Therapist will use CBT, Mindfulness exercises, Coping skills and Referrals, as needed by client. Client has verbally approved this treatment plan.  Francis KATHEE Macintosh, [facility]

## 2024-10-27 ENCOUNTER — Ambulatory Visit (INDEPENDENT_AMBULATORY_CARE_PROVIDER_SITE_OTHER): Admitting: Psychology

## 2024-10-27 DIAGNOSIS — F4321 Adjustment disorder with depressed mood: Secondary | ICD-10-CM

## 2024-10-27 NOTE — Progress Notes (Signed)
 [facility] Counselor/Therapist Progress Note  Patient ID: Kendra Day, MRN: [id number],    Date: 10/27/2024  Time Spent: 54 mins  start time: 1400   end time: 1454  Treatment Type: Individual Therapy  Reported Symptoms: Pt presented for follow up session, via Caregility video.  Pt grants consent for session, stating she is in her home with no one else present; pt states she understands the limits of virtual session.  I shared with pt that I am in my office with no one else present here either.    Mental Status Exam: Appearance:  Casual     Behavior: Appropriate  Motor: Normal  Speech/Language:  Clear and Coherent  Affect: Appropriate  Mood: normal  Thought process: normal  Thought content:   WNL  Sensory/Perceptual disturbances:   WNL  Orientation: oriented to person, place, and time/date  Attention: Good  Concentration: Good  Memory: WNL  Fund of knowledge:  Good  Insight:   Good  Judgment:  Good  Impulse Control: Good   Risk Assessment: Danger to Self:  No Self-injurious Behavior: No Danger to Others: No Duty to Warn:no Physical Aggression / Violence:No  Access to Firearms a concern: No  Gang Involvement:No   Subjective: Pt shares that I have been pretty good since our last session. Pt is very pleased that [employer] has hired Lynwood Ryder and has high hopes for the football program in the coming years.  Gordy has been home for a couple of days since he moved to [place].  Debby was in a car accident recently and they had to purchase another used car; Gordy got angry that they bought a car for [place] and they didn't give Debby his car and buy Gordy the new car.  Pt is concerned that [name]'s focus seems to be primarily about money and what is in whatever for him.  She remains hopeful that Gordy might mature at some point.  Pt and the family are going to [place] on Thursday for her family and on Friday to see [name]'s side of the family on Friday.  [name]'s  family still has not talked about transitioning his sister, Leeroy, to a nursing home.  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  Pt and Lamar are trying to be intentional about decompressing with Gordy now out of the house.  They are also trying to encourage him to investigate his needs and how to address them.  Pt and Lamar have been continuing to puzzle and they have been out of town with family as well.  Pt shares they continue to watch favorite TV shows, going to football games, having dinner with friends, she continues to walk the dogs and she has been listening to books in the car, etc.  Encouraged pt to continue with her self care activities and we will meet in 3 wks for a follow up session.   Interventions: Cognitive Behavioral Therapy  Diagnosis:Adjustment disorder with depressed mood  Plan: Treatment Plan Strengths/Abilities:  Intelligent, Intuitive, Willing to participate in therapy Treatment Preferences:  Outpatient Individual Therapy Statement of Needs:  Patient is to use CBT, mindfulness and coping skills to help manage and/or decrease symptoms associated with their diagnosis. Symptoms:  Depressed/Irritable mood, worry, social withdrawal Problems Addressed:  Depressive thoughts, Sadness, Sleep issues, etc. Long Term Goals:  Pt to reduce overall level, frequency, and intensity of the feelings of depression as evidenced by  decreased irritability, negative self talk, and helpless feelings from 6 to 7 days/week to 0 to 1 days/week, per client report, for at least 3 consecutive months.  Progress: 30% Short Term Goals:  Pt to verbally express understanding of the relationship between feelings of depression and their impact on thinking patterns and behaviors.  Pt to verbalize an understanding of the role that distorted thinking plays in creating fears, excessive worry, and ruminations.  Progress: 30% Target Date:   11/23/2025 Frequency:  Monthly Modality:  Cognitive Behavioral Therapy Interventions by Therapist:  Therapist will use CBT, Mindfulness exercises, Coping skills and Referrals, as needed by client. Client has verbally approved this treatment plan.  Francis KATHEE Macintosh, [facility]

## 2024-11-13 ENCOUNTER — Ambulatory Visit: Admitting: Psychology

## 2024-11-13 DIAGNOSIS — F4321 Adjustment disorder with depressed mood: Secondary | ICD-10-CM

## 2024-11-13 NOTE — Progress Notes (Signed)
 [facility] Counselor/Therapist Progress Note  Patient ID: Kendra Day, MRN: [id number],    Date: 11/13/2024  Time Spent: 54 mins  start time: 1500   end time: 1554  Treatment Type: Individual Therapy  Reported Symptoms: Pt presented for follow up session, via Caregility video.  Pt grants consent for session, stating she is in her home with no one else present; pt states she understands the limits of virtual session.  I shared with pt that I am in my office with no one else present here either.    Mental Status Exam: Appearance:  Casual     Behavior: Appropriate  Motor: Normal  Speech/Language:  Clear and Coherent  Affect: Appropriate  Mood: normal  Thought process: normal  Thought content:   WNL  Sensory/Perceptual disturbances:   WNL  Orientation: oriented to person, place, and time/date  Attention: Good  Concentration: Good  Memory: WNL  Fund of knowledge:  Good  Insight:   Good  Judgment:  Good  Impulse Control: Good   Risk Assessment: Danger to Self:  No Self-injurious Behavior: No Danger to Others: No Duty to Warn:no Physical Aggression / Violence:No  Access to Firearms a concern: No  Gang Involvement:No   Notified of Retirement  Subjective: Pt shares that I have been pretty good since our last session.  Pt shares that she and Kendra Day are empty nesters for the next four days until Kendra Day comes home for Winter Break.  Kendra Day will come home some for Christmas; he overstayed his welcome at Thanksgiving so we have to firm with him about when he can be here for Christmas.  Pt shares that Kendra Day (52 yo) still does not have a job but he has interviewed for a delivery job but he chose not to take it.  He has shared with them his reasons for not wanting to work with this type of person and that type of person.  Pt shares that they did got to see Kendra Day's sister Kendra Day) and her kids for Thanksgiving; nothing was resolved for his sister's care.  Pt shares  that Kendra Day is doing well; they went to see [employer] at the [facility] last night and enjoyed that event.  They have been puzzling as well.  They are having pt's family at their home next weekend and they are going to [place] with friends for New Years.  Pt shares they continue to watch favorite TV shows, going to football games, having dinner with friends, she continues to walk the dogs and she has been listening to books in the car, etc.  Encouraged pt to continue with her self care activities and we will meet in 3 wks for a follow up session.   Interventions: Cognitive Behavioral Therapy  Diagnosis:Adjustment disorder with depressed mood  Plan: Treatment Plan Strengths/Abilities:  Intelligent, Intuitive, Willing to participate in therapy Treatment Preferences:  Outpatient Individual Therapy Statement of Needs:  Patient is to use CBT, mindfulness and coping skills to help manage and/or decrease symptoms associated with their diagnosis. Symptoms:  Depressed/Irritable mood, worry, social withdrawal Problems Addressed:  Depressive thoughts, Sadness, Sleep issues, etc. Long Term Goals:  Pt to reduce overall level, frequency, and intensity of the feelings of depression as evidenced by decreased irritability, negative self talk, and helpless feelings from 6 to 7 days/week to 0 to 1 days/week, per client report, for at least 3 consecutive months.  Progress: 30% Short Term Goals:  Pt to verbally express understanding of the relationship between feelings of  depression and their impact on thinking patterns and behaviors.  Pt to verbalize an understanding of the role that distorted thinking plays in creating fears, excessive worry, and ruminations.  Progress: 30% Target Date:  11/23/2025 Frequency:  Monthly Modality:  Cognitive Behavioral Therapy Interventions by Therapist:  Therapist will use CBT, Mindfulness exercises, Coping skills and Referrals, as needed by client. Client has verbally  approved this treatment plan.  Francis KATHEE Macintosh, [facility]

## 2024-12-18 ENCOUNTER — Ambulatory Visit: Admitting: Psychology

## 2024-12-18 DIAGNOSIS — F4321 Adjustment disorder with depressed mood: Secondary | ICD-10-CM | POA: Diagnosis not present

## 2024-12-18 NOTE — Progress Notes (Signed)
 "  [facility] Counselor/Therapist Progress Note  Patient ID: Kendra Day, MRN: [id number],    Date: 12/18/2024  Time Spent: 57 mins  start time: 1500   end time: 1557  Treatment Type: Individual Therapy  Reported Symptoms: Pt presented for follow up session, via Caregility video.  Pt grants consent for session, stating she is in her home with no one else present; pt states she understands the limits of virtual session.  I shared with pt that I am in my office with no one else present here either.    Mental Status Exam: Appearance:  Casual     Behavior: Appropriate  Motor: Normal  Speech/Language:  Clear and Coherent  Affect: Appropriate  Mood: normal  Thought process: normal  Thought content:   WNL  Sensory/Perceptual disturbances:   WNL  Orientation: oriented to person, place, and time/date  Attention: Good  Concentration: Good  Memory: WNL  Fund of knowledge:  Good  Insight:   Good  Judgment:  Good  Impulse Control: Good   Risk Assessment: Danger to Self:  No Self-injurious Behavior: No Danger to Others: No Duty to Warn:no Physical Aggression / Violence:No  Access to Firearms a concern: No  Gang Involvement:No   Notified of Retirement  Subjective: Pt shares that I have been pretty good since our last session.  Christmas was good; kind of low key.  The end of Christmas was a bit frustrating with Kendra Day.  We told him he could stay for 5 nights.  He refused to leave.  We had to threaten to call the police for him to leave.  He came back last week for one night for his birthday and Kendra Day had to pack all of his stuff and put it in the driveway at 930 pm at night.  He also called me this week telling me that he was going to sell his car but the car is in my name.  They have told Kendra Day they would help him get another car at some point and he took off with that process.  Pt shares that their conversations with him are only about things that he brings up that he  knows will be offensive to them (i.e., [place] was not that bad, etc.).  Pt shares that Kendra Day still is not working; he did get a job offer but he felt he was being racially profiled and did not take the job.  Pt shares that she feels like she is the source of Kendra Day's anger and she feels like it stems, somehow, from his adoption.  She acknowledges that she was the disciplinarian in their home with both boys when they were younger.  Pt shares that she and Kendra Day are at a loss as to how to help Kendra Day move forward in his life when he seems to be unwilling to move forward.  Pt shares she had her family at home for Christmas and that was a good time.  She and Kendra Day enjoyed their trip to [place] over New Years.  They are going to [place] tomorrow to see Kendra Day's brother and sister Kendra Day).  One of their nieces is getting married in April.  Pt shares that Kendra Day is doing well.  They have been puzzling as well.  Pt shares they continue to watch favorite TV shows, going to football games, having dinner with friends, she continues to walk the dogs and she has been listening to books in the car, etc.  Encouraged pt to continue with her  self care activities and we will meet in 4 wks for a follow up session.   Interventions: Cognitive Behavioral Therapy  Diagnosis:Adjustment disorder with depressed mood  Plan: Treatment Plan Strengths/Abilities:  Intelligent, Intuitive, Willing to participate in therapy Treatment Preferences:  Outpatient Individual Therapy Statement of Needs:  Patient is to use CBT, mindfulness and coping skills to help manage and/or decrease symptoms associated with their diagnosis. Symptoms:  Depressed/Irritable mood, worry, social withdrawal Problems Addressed:  Depressive thoughts, Sadness, Sleep issues, etc. Long Term Goals:  Pt to reduce overall level, frequency, and intensity of the feelings of depression as evidenced by decreased irritability, negative self talk, and helpless feelings  from 6 to 7 days/week to 0 to 1 days/week, per client report, for at least 3 consecutive months.  Progress: 30% Short Term Goals:  Pt to verbally express understanding of the relationship between feelings of depression and their impact on thinking patterns and behaviors.  Pt to verbalize an understanding of the role that distorted thinking plays in creating fears, excessive worry, and ruminations.  Progress: 30% Target Date:  11/23/2025 Frequency:  Monthly Modality:  Cognitive Behavioral Therapy Interventions by Therapist:  Therapist will use CBT, Mindfulness exercises, Coping skills and Referrals, as needed by client. Client has verbally approved this treatment plan.  Francis KATHEE Macintosh, [facility] "

## 2025-01-15 ENCOUNTER — Ambulatory Visit: Admitting: Psychology
